# Patient Record
Sex: Male | Born: 1965 | Race: White | Hispanic: No | Marital: Married | Smoking: Never smoker
Health system: Southern US, Community
[De-identification: ages and names within clinical notes are randomized; demographics above are authoritative.]

## PROBLEM LIST (undated history)

## (undated) DIAGNOSIS — IMO0002 Reserved for concepts with insufficient information to code with codable children: Secondary | ICD-10-CM

## (undated) DIAGNOSIS — E785 Hyperlipidemia, unspecified: Secondary | ICD-10-CM

## (undated) DIAGNOSIS — F319 Bipolar disorder, unspecified: Secondary | ICD-10-CM

## (undated) DIAGNOSIS — F32A Depression, unspecified: Secondary | ICD-10-CM

## (undated) DIAGNOSIS — F329 Major depressive disorder, single episode, unspecified: Secondary | ICD-10-CM

## (undated) HISTORY — DX: Reserved for concepts with insufficient information to code with codable children: IMO0002

## (undated) HISTORY — DX: Bipolar disorder, unspecified: F31.9

## (undated) HISTORY — DX: Hyperlipidemia, unspecified: E78.5

## (undated) HISTORY — PX: OTHER SURGICAL HISTORY: SHX169

## (undated) HISTORY — DX: Depression, unspecified: F32.A

## (undated) HISTORY — PX: TONSILLECTOMY: SUR1361

## (undated) HISTORY — DX: Major depressive disorder, single episode, unspecified: F32.9

---

## 2009-01-19 ENCOUNTER — Ambulatory Visit (HOSPITAL_COMMUNITY): Payer: Self-pay | Admitting: Psychiatry

## 2009-02-19 ENCOUNTER — Ambulatory Visit (HOSPITAL_COMMUNITY): Payer: Self-pay | Admitting: Psychiatry

## 2009-04-06 ENCOUNTER — Ambulatory Visit (HOSPITAL_COMMUNITY): Payer: Self-pay | Admitting: Psychiatry

## 2009-04-19 ENCOUNTER — Ambulatory Visit (HOSPITAL_COMMUNITY): Payer: Self-pay | Admitting: Licensed Clinical Social Worker

## 2009-04-24 ENCOUNTER — Ambulatory Visit (HOSPITAL_COMMUNITY): Payer: Self-pay | Admitting: Licensed Clinical Social Worker

## 2009-05-01 ENCOUNTER — Ambulatory Visit (HOSPITAL_COMMUNITY): Payer: Self-pay | Admitting: Licensed Clinical Social Worker

## 2009-05-17 ENCOUNTER — Ambulatory Visit (HOSPITAL_COMMUNITY): Payer: Self-pay | Admitting: Licensed Clinical Social Worker

## 2009-05-23 ENCOUNTER — Ambulatory Visit (HOSPITAL_COMMUNITY): Payer: Self-pay | Admitting: Licensed Clinical Social Worker

## 2009-05-31 ENCOUNTER — Ambulatory Visit (HOSPITAL_COMMUNITY): Payer: Self-pay | Admitting: Licensed Clinical Social Worker

## 2009-06-01 ENCOUNTER — Ambulatory Visit (HOSPITAL_COMMUNITY): Payer: Self-pay | Admitting: Psychiatry

## 2009-06-25 ENCOUNTER — Ambulatory Visit (HOSPITAL_COMMUNITY): Payer: Self-pay | Admitting: Licensed Clinical Social Worker

## 2009-07-11 ENCOUNTER — Ambulatory Visit (HOSPITAL_COMMUNITY): Payer: Self-pay | Admitting: Licensed Clinical Social Worker

## 2009-07-16 ENCOUNTER — Ambulatory Visit (HOSPITAL_COMMUNITY): Payer: Self-pay | Admitting: Licensed Clinical Social Worker

## 2009-07-23 ENCOUNTER — Ambulatory Visit (HOSPITAL_COMMUNITY): Payer: Self-pay | Admitting: Licensed Clinical Social Worker

## 2009-08-20 ENCOUNTER — Ambulatory Visit (HOSPITAL_COMMUNITY): Payer: Self-pay | Admitting: Licensed Clinical Social Worker

## 2009-09-07 ENCOUNTER — Ambulatory Visit (HOSPITAL_COMMUNITY): Payer: Self-pay | Admitting: Psychiatry

## 2009-09-13 ENCOUNTER — Ambulatory Visit (HOSPITAL_COMMUNITY): Payer: Self-pay | Admitting: Licensed Clinical Social Worker

## 2009-12-07 ENCOUNTER — Ambulatory Visit (HOSPITAL_COMMUNITY): Payer: Self-pay | Admitting: Psychiatry

## 2010-03-08 ENCOUNTER — Ambulatory Visit (HOSPITAL_COMMUNITY): Payer: Self-pay | Admitting: Psychiatry

## 2010-03-31 HISTORY — PX: FOOT SURGERY: SHX648

## 2010-06-05 ENCOUNTER — Encounter (HOSPITAL_COMMUNITY): Payer: BC Managed Care – PPO | Admitting: Psychiatry

## 2010-06-05 DIAGNOSIS — F3189 Other bipolar disorder: Secondary | ICD-10-CM

## 2010-06-07 ENCOUNTER — Encounter (HOSPITAL_COMMUNITY): Payer: Self-pay | Admitting: Psychiatry

## 2010-09-06 ENCOUNTER — Encounter (HOSPITAL_COMMUNITY): Payer: BC Managed Care – PPO | Admitting: Psychiatry

## 2010-09-06 DIAGNOSIS — F3189 Other bipolar disorder: Secondary | ICD-10-CM

## 2010-09-30 ENCOUNTER — Encounter (HOSPITAL_COMMUNITY): Payer: BC Managed Care – PPO | Admitting: Psychiatry

## 2010-10-30 ENCOUNTER — Encounter (HOSPITAL_COMMUNITY): Payer: BC Managed Care – PPO | Admitting: Psychiatry

## 2010-10-30 DIAGNOSIS — F3189 Other bipolar disorder: Secondary | ICD-10-CM

## 2011-01-01 ENCOUNTER — Encounter (HOSPITAL_COMMUNITY): Payer: BC Managed Care – PPO | Admitting: Psychiatry

## 2011-04-10 ENCOUNTER — Other Ambulatory Visit (HOSPITAL_COMMUNITY): Payer: Self-pay | Admitting: Psychiatry

## 2011-04-10 NOTE — Telephone Encounter (Signed)
Dr. Lolly Mustache,  Timothy Burnett's last appointment was 10/30/10. He cancelled an appointment in October. He is scheduled to be seen 05/09/11. I was not sure how to refill this.  Sandi

## 2011-05-09 ENCOUNTER — Encounter (HOSPITAL_COMMUNITY): Payer: Self-pay | Admitting: Psychiatry

## 2011-05-09 ENCOUNTER — Ambulatory Visit (INDEPENDENT_AMBULATORY_CARE_PROVIDER_SITE_OTHER): Payer: BC Managed Care – PPO | Admitting: Psychiatry

## 2011-05-09 ENCOUNTER — Encounter (HOSPITAL_COMMUNITY): Payer: Self-pay

## 2011-05-09 DIAGNOSIS — Z79899 Other long term (current) drug therapy: Secondary | ICD-10-CM

## 2011-05-09 DIAGNOSIS — F319 Bipolar disorder, unspecified: Secondary | ICD-10-CM

## 2011-05-09 MED ORDER — SERTRALINE HCL 100 MG PO TABS: 100.0000 mg | ORAL_TABLET | Freq: Every day | ORAL | Status: DC

## 2011-05-09 MED ORDER — OLANZAPINE 7.5 MG PO TABS: 7.5000 mg | ORAL_TABLET | Freq: Every day | ORAL | Status: DC

## 2011-05-09 MED ORDER — LAMOTRIGINE 150 MG PO TABS: 150.0000 mg | ORAL_TABLET | Freq: Two times a day (BID) | ORAL | Status: DC

## 2011-05-09 NOTE — Progress Notes (Signed)
Chief complaint I need my medication  History of presenting illness Patient is 46 year old Caucasian married employed man who came for his followup appointment. Patient was last seen in August 2012. Patient has history of bipolar disorder. He is taking Lamictal Zyprexa and Zoloft. He has been compliant with his medication. He reported no side effects of medication. He has changed his job and now working for [employer]. Patient told though his income did not increase but it has better opportunity in the future. Patient continues to have marital issues especially related to finances. Her wife is not happy with her current job. Patient admitted episodic agitation anger and mood swings but they are less intense and less frequent. He is sleeping fine. He denies any tremors or side effects of medication. He has not seen his primary care Dr. but is scheduled to see in March. Patient is planning to go [facility] to visit his children in August this year.  Past psychiatric history Patient denies any history of suicidal attempt or any history of inpatient psychiatric treatment. However he endorsed a history of impulsive behavior. In the past he had tried Abilify Geodon with limited improvement.  Medical history Hyperlipidemia  Alcohol and substance use history Patient has a history of drinking alcohol or illegal substance use.  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  Family History Patient denies any family history of psychiatric illness  Psychosocial history Patient was [place]. Patient has 3 children from his second marriage. Patient children's lives in [place]. Patient has history of financial depth in 2001 that causes and off his marriage. Patient parents live in [place].  Mental status examination Patient is fairly groomed and well dressed. His speech is soft clear and coherent. He described his mood is  good and his affect is mood congruent. He denies any active or passive suicidal thoughts or homicidal thoughts. He denies any auditory or visual hallucination. His attention and concentration is fair. His thought process is logical. She's alert and oriented x3. His insight judgment and pulse control is okay.  Diagnoses Axis I bipolar disorder Axis II deferred Axis III see medical history Axis IV 1 to moderate Axis V 65-70  Plan At this time patient is fairly stable on his medication. I will continue his Lamictal Zoloft and Zyprexa. Patient reported no side effects including any tremors shakes or extrapyramidal side effects. I will order CBC CMP and hemoglobin A1c. I will see him again in 3 months. Time spent 30 minutes

## 2011-08-06 ENCOUNTER — Ambulatory Visit (HOSPITAL_COMMUNITY): Payer: Self-pay | Admitting: Psychiatry

## 2011-08-08 LAB — COMPREHENSIVE METABOLIC PANEL
ALT: 23 U/L (ref 0–53)
Albumin: 4.6 g/dL (ref 3.5–5.2)
CO2: 24 mEq/L (ref 19–32)
Calcium: 9.5 mg/dL (ref 8.4–10.5)
Chloride: 104 mEq/L (ref 96–112)
Glucose, Bld: 92 mg/dL (ref 70–99)
Potassium: 4.1 mEq/L (ref 3.5–5.3)
Sodium: 139 mEq/L (ref 135–145)
Total Bilirubin: 0.4 mg/dL (ref 0.3–1.2)
Total Protein: 6.6 g/dL (ref 6.0–8.3)

## 2011-08-09 LAB — CBC WITH DIFFERENTIAL/PLATELET
Basophils Absolute: 0 10*3/uL (ref 0.0–0.1)
Basophils Relative: 0 % (ref 0–1)
Eosinophils Absolute: 0.1 10*3/uL (ref 0.0–0.7)
Eosinophils Relative: 1 % (ref 0–5)
HCT: 43 % (ref 39.0–52.0)
Hemoglobin: 14.2 g/dL (ref 13.0–17.0)
Lymphocytes Relative: 27 % (ref 12–46)
Lymphs Abs: 2.4 10*3/uL (ref 0.7–4.0)
MCH: 31.1 pg (ref 26.0–34.0)
MCHC: 33 g/dL (ref 30.0–36.0)
MCV: 94.3 fL (ref 78.0–100.0)
Monocytes Absolute: 0.4 10*3/uL (ref 0.1–1.0)
Monocytes Relative: 5 % (ref 3–12)
Neutro Abs: 6 10*3/uL (ref 1.7–7.7)
Neutrophils Relative %: 67 % (ref 43–77)
Platelets: 228 10*3/uL (ref 150–400)
RBC: 4.56 MIL/uL (ref 4.22–5.81)
RDW: 14.1 % (ref 11.5–15.5)
WBC: 9 10*3/uL (ref 4.0–10.5)

## 2011-08-09 LAB — HEMOGLOBIN A1C
Hgb A1c MFr Bld: 5.5 % (ref <5.7)
Mean Plasma Glucose: 111 mg/dL (ref <117)

## 2011-08-13 ENCOUNTER — Encounter (HOSPITAL_COMMUNITY): Payer: Self-pay | Admitting: Psychiatry

## 2011-08-13 ENCOUNTER — Ambulatory Visit (INDEPENDENT_AMBULATORY_CARE_PROVIDER_SITE_OTHER): Payer: Self-pay | Admitting: Psychiatry

## 2011-08-13 VITALS — BP 134/70 | Wt 175.0 lb

## 2011-08-13 DIAGNOSIS — F319 Bipolar disorder, unspecified: Secondary | ICD-10-CM

## 2011-08-13 MED ORDER — LAMOTRIGINE 150 MG PO TABS: 150.0000 mg | ORAL_TABLET | Freq: Two times a day (BID) | ORAL | Status: DC

## 2011-08-13 MED ORDER — OLANZAPINE 7.5 MG PO TABS: 7.5000 mg | ORAL_TABLET | Freq: Every day | ORAL | Status: DC

## 2011-08-13 MED ORDER — SERTRALINE HCL 100 MG PO TABS: 100.0000 mg | ORAL_TABLET | Freq: Every day | ORAL | Status: DC

## 2011-08-13 NOTE — Progress Notes (Signed)
Chief complaint Medication management and followup.    History of presenting illness Patient is 46 year old Caucasian married employed man who came for his followup appointment.  Patient excited as he visiting his children next month.  He is planning to visit [place] to see them.  He is compliant with his medication .  He denies any side effects of medication except that he is feeling tired sometime.  He is taking medication for his high cholesterol and believe that is causing him excessive tiredness.  Overall his mood has been stable.  He denies any agitation anger mood swing.  He sleeps fine.  He likes his job.  He is not drinking or using any illegal substance.  No paranoia or psychosis at this time.  He denies any crying spells or any depressive thoughts.  He has done his blood work which is within normal limit.  Current psychiatric medication Lamictal 150 mg twice a day Olanzapine 7.5 mg at bedtime Zoloft 100 mg daily.  Past psychiatric history Patient denies any history of suicidal attempt or any history of inpatient psychiatric treatment. However he endorsed a history of impulsive behavior. In the past he had tried Abilify Geodon with limited improvement.  Medical history Hyperlipidemia  Alcohol and substance use history Patient denies any recent history of drinking alcohol or illegal substance use.  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  Family History Patient denies any family history of psychiatric illness  Psychosocial history Patient was born in [place]. Patient has 3 children from his second marriage. Patient children's lives in [place]. Patient has history of financial depth in 2001 that causes and off his marriage. Patient parents live in [place].  Mental status examination Patient is fairly groomed and well dressed.  He is pleasant calm and cooperative.  His speech is soft clear and  coherent. He described his mood is good and his affect is mood congruent. He denies any active or passive suicidal thoughts or homicidal thoughts. He denies any auditory or visual hallucination. His attention and concentration is fair. His thought process is logical.  There no psychotic symptoms present.  He is alert and oriented x3. His insight judgment and pulse control is okay.  Diagnoses Axis I bipolar disorder Axis II deferred Axis III see medical history Axis IV 1 to moderate Axis V 65-70  Plan I reviewed her psychosocial stressors, recent blood test results last previous notes.  He is a normal CBC and chemistry and his hemoglobin A1c is 5.5.  Patient is stable on his current psychiatric medication.  I recommend to see his primary care physician to discuss for his chronic tiredness which could be due to cost of medication.  I recommend to call us if he has any question or concern about the medication or if he feel worsening of the symptoms.  I will see him again in 3 months.  Time spent 30 minutes.

## 2011-11-12 ENCOUNTER — Other Ambulatory Visit (HOSPITAL_COMMUNITY): Payer: Self-pay | Admitting: *Deleted

## 2011-11-12 DIAGNOSIS — F319 Bipolar disorder, unspecified: Secondary | ICD-10-CM

## 2011-11-12 MED ORDER — LAMOTRIGINE 150 MG PO TABS: 150.0000 mg | ORAL_TABLET | Freq: Two times a day (BID) | ORAL | Status: DC

## 2011-11-12 MED ORDER — OLANZAPINE 7.5 MG PO TABS: 7.5000 mg | ORAL_TABLET | Freq: Every day | ORAL | Status: DC

## 2011-11-12 MED ORDER — SERTRALINE HCL 100 MG PO TABS: 100.0000 mg | ORAL_TABLET | Freq: Every day | ORAL | Status: DC

## 2011-11-14 ENCOUNTER — Ambulatory Visit (HOSPITAL_COMMUNITY): Payer: Self-pay | Admitting: Psychiatry

## 2011-11-26 ENCOUNTER — Ambulatory Visit (INDEPENDENT_AMBULATORY_CARE_PROVIDER_SITE_OTHER): Payer: BC Managed Care – PPO | Admitting: Psychiatry

## 2011-11-26 ENCOUNTER — Encounter (HOSPITAL_COMMUNITY): Payer: Self-pay | Admitting: Psychiatry

## 2011-11-26 VITALS — BP 117/80 | HR 88 | Wt 175.0 lb

## 2011-11-26 DIAGNOSIS — F319 Bipolar disorder, unspecified: Secondary | ICD-10-CM

## 2011-11-26 MED ORDER — LAMOTRIGINE 150 MG PO TABS: 150.0000 mg | ORAL_TABLET | Freq: Two times a day (BID) | ORAL | Status: DC

## 2011-11-26 MED ORDER — SERTRALINE HCL 100 MG PO TABS: 100.0000 mg | ORAL_TABLET | Freq: Every day | ORAL | Status: DC

## 2011-11-26 MED ORDER — OLANZAPINE 7.5 MG PO TABS: 7.5000 mg | ORAL_TABLET | Freq: Every day | ORAL | Status: DC

## 2011-11-26 NOTE — Progress Notes (Signed)
Chief complaint Medication management and followup.    History of presenting illness Patient is 46 year old Caucasian married employed man who came for his followup appointment.  Patient is compliant with his psychiatric medication.  Recently his job schedule is change in these very happy with her schedule.  He is working 4 days 12 hour shift in [place] and he still able to keep his job at [employer].  He still feel tired especially those days when he work at [employer].  He has not seen his primary care physician which was recommended on his last visit to rule out his fatigue and tiredness.  He feel better with a schedule job hours.  He visited [place] to see his daughter.  He reported history was very good and he enjoyed visiting them.  Overall patient is doing very well on his current psychiatric medication.  He denies any agitation anger mood swing.  He is sleeping better.  He denies any recent paranoia or any psychotic episode.  He denies any tremors or shakes.  He's not drinking or using any illegal substance.  Current psychiatric medication Lamictal 150 mg twice a day Olanzapine 7.5 mg at bedtime Zoloft 100 mg daily.  Past psychiatric history Patient denies any history of suicidal attempt or any history of inpatient psychiatric treatment. However he endorsed a history of impulsive behavior. In the past he had tried Abilify Geodon with limited improvement.  Medical history Hyperlipidemia.  Alcohol and substance use history Patient denies any recent history of drinking alcohol or illegal substance use.  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  Family History Patient denies any family history of psychiatric illness  Psychosocial history Patient was born in [place].  Patient has been married 3 times.  Patient has 3 children from his second marriage. Patient children's lives in [place]. Patient has history of  financial depth in 2001 that causes problem in his marriage. Patient lives with her current wife .    Mental status examination Patient is fairly groomed and well dressed.  He is pleasant calm and cooperative.  His speech is soft clear and coherent. He described his mood is good and his affect is mood congruent. He denies any active or passive suicidal thoughts or homicidal thoughts. He denies any auditory or visual hallucination. His attention and concentration is good His thought process is logical.  There no psychotic symptoms present.  He is alert and oriented x3. His insight judgment and pulse control is okay.  Diagnoses Axis I bipolar disorder Axis II deferred Axis III see medical history Axis IV 1 to moderate Axis V 65-70  Plan I reviewed his vitals, last progress note and response to medication.  One more time I encouraged him to have checkup with his primary care physician for chronic complain of tired feeling.  We have discusses blood results on his last visit which was normal.  I will continue his current psychiatric medication.  I will see him again in 4 months.  Time spent 30 minutes.

## 2012-03-29 ENCOUNTER — Ambulatory Visit (HOSPITAL_COMMUNITY): Payer: Self-pay | Admitting: Psychiatry

## 2012-04-06 ENCOUNTER — Encounter (HOSPITAL_COMMUNITY): Payer: Self-pay | Admitting: Psychiatry

## 2012-04-06 ENCOUNTER — Ambulatory Visit (INDEPENDENT_AMBULATORY_CARE_PROVIDER_SITE_OTHER): Payer: Self-pay | Admitting: Psychiatry

## 2012-04-06 DIAGNOSIS — F319 Bipolar disorder, unspecified: Secondary | ICD-10-CM

## 2012-04-06 MED ORDER — LAMOTRIGINE 150 MG PO TABS: 150.0000 mg | ORAL_TABLET | Freq: Every day | ORAL | Status: DC

## 2012-04-06 MED ORDER — SERTRALINE HCL 100 MG PO TABS: 100.0000 mg | ORAL_TABLET | Freq: Every day | ORAL | Status: DC

## 2012-04-06 MED ORDER — OLANZAPINE 7.5 MG PO TABS: 7.5000 mg | ORAL_TABLET | Freq: Every day | ORAL | Status: DC

## 2012-04-06 MED ORDER — LAMOTRIGINE 150 MG PO TABS: 150.0000 mg | ORAL_TABLET | Freq: Two times a day (BID) | ORAL | Status: DC

## 2012-04-06 NOTE — Progress Notes (Signed)
Patient ID: Timothy Burnett, male   DOB: 22-Aug-1965, 47 y.o.   MRN: [id number] Chief complaint Medication management and followup.    History of presenting illness Patient is 47 year old Caucasian married employed man who came for his followup appointment.  Patient is compliant with his psychiatric medication.  He was disappointed from his job because he was doing 12 hour shift 4 days but now he is doing rotation and some days work in the night and some days work in the morning.  He's not happy with his job schedule but he has no other choice.  He still works second job at [employer] .  He has noted some time getting frustrated but denies any aggression and violence or severe mood swing.  He does not want to change his medication.  He scheduled to see his primary care physician on second week of March at [facility] .  He's not taking medication for his cholesterol as he ran out and has not seen physician.  He denies any side effects of medication.  He's not drinking or using any illegal substance.  He denies any paranoia or any hallucination.  Current psychiatric medication Lamictal 150 mg twice a day Olanzapine 7.5 mg at bedtime Zoloft 100 mg daily.  Past psychiatric history Patient denies any history of suicidal attempt or any history of inpatient psychiatric treatment. However he endorsed a history of impulsive behavior. In the past he had tried Abilify Geodon with limited improvement.  Medical history Hyperlipidemia.  Alcohol and substance use history Patient denies any recent history of drinking alcohol or illegal substance use.  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  Family History Patient denies any family history of psychiatric illness  Psychosocial history Patient was [place].  Patient has been married 3 times.  Patient has 3 children from his second marriage. Patient children's lives in [place]. Patient has history of financial depth in 2001 that causes problem in his marriage. Patient lives with her current wife .    Mental status examination Patient is fairly groomed and well dressed.  He is pleasant calm and cooperative.  His speech is soft clear and coherent. He described his mood is okay and his affect is mood appropriate.  He denies any active or passive suicidal thoughts or homicidal thoughts. He denies any auditory or visual hallucination. His attention and concentration is good His thought process is logical.  There no psychotic symptoms present.  He is alert and oriented x3. His insight judgment and pulse control is okay.  Diagnoses Axis I bipolar disorder Axis II deferred Axis III see medical history Axis IV 1 to moderate Axis V 65-70  Plan I will continue his current psychiatric medication.  However I offer to call us if he feel that he is getting more irritable and frustrated.  He's looked into changes medication.  I also recommend individual counseling but he refused.  I explained risks and benefits of medication.  I will see him again in 3 months.  Patient is scheduled to see primary care physician and second week of March.

## 2012-04-07 ENCOUNTER — Ambulatory Visit (HOSPITAL_COMMUNITY): Payer: Self-pay | Admitting: Psychiatry

## 2012-04-30 ENCOUNTER — Ambulatory Visit: Payer: Self-pay | Admitting: Family Medicine

## 2012-06-11 ENCOUNTER — Encounter: Payer: Self-pay | Admitting: Family Medicine

## 2012-06-11 ENCOUNTER — Ambulatory Visit (INDEPENDENT_AMBULATORY_CARE_PROVIDER_SITE_OTHER): Payer: BC Managed Care – PPO | Admitting: Family Medicine

## 2012-06-11 VITALS — BP 120/70 | HR 76 | Temp 98.2°F | Ht 68.5 in | Wt 172.8 lb

## 2012-06-11 DIAGNOSIS — Z0000 Encounter for general adult medical examination without abnormal findings: Secondary | ICD-10-CM | POA: Insufficient documentation

## 2012-06-11 DIAGNOSIS — E785 Hyperlipidemia, unspecified: Secondary | ICD-10-CM

## 2012-06-11 LAB — PSA: PSA: 0.88 ng/mL (ref 0.10–4.00)

## 2012-06-11 LAB — BASIC METABOLIC PANEL
CO2: 29 mEq/L (ref 19–32)
Calcium: 9.4 mg/dL (ref 8.4–10.5)
Chloride: 101 mEq/L (ref 96–112)
Glucose, Bld: 86 mg/dL (ref 70–99)
Sodium: 139 mEq/L (ref 135–145)

## 2012-06-11 LAB — CBC WITH DIFFERENTIAL/PLATELET
Eosinophils Relative: 1.4 % (ref 0.0–5.0)
HCT: 42.8 % (ref 39.0–52.0)
Hemoglobin: 14.3 g/dL (ref 13.0–17.0)
Lymphs Abs: 2.3 10*3/uL (ref 0.7–4.0)
Monocytes Relative: 5.3 % (ref 3.0–12.0)
Platelets: 242 10*3/uL (ref 150.0–400.0)
WBC: 10.3 10*3/uL (ref 4.5–10.5)

## 2012-06-11 LAB — HEPATIC FUNCTION PANEL
ALT: 23 U/L (ref 0–53)
AST: 19 U/L (ref 0–37)
Albumin: 4.3 g/dL (ref 3.5–5.2)
Alkaline Phosphatase: 88 U/L (ref 39–117)
Total Protein: 6.8 g/dL (ref 6.0–8.3)

## 2012-06-11 LAB — LIPID PANEL: Triglycerides: 293 mg/dL — ABNORMAL HIGH (ref 0.0–149.0)

## 2012-06-11 NOTE — Patient Instructions (Signed)
Follow up in 6 months to recheck cholesterol We'll notify you of your lab results and make any changes if needed Call with any questions or concerns Welcome!  We're glad to have you!

## 2012-06-11 NOTE — Progress Notes (Signed)
  Subjective:    Patient ID: Timothy Burnett, male    DOB: [birth year], 47 y.o.   MRN: [id number]  HPI New to establish.  Previous MD- [facility].  Psych- [facility]  No concerns today.   Review of Systems Patient reports no vision/hearing changes, anorexia, fever ,adenopathy, persistant/recurrent hoarseness, swallowing issues, chest pain, palpitations, edema, persistant/recurrent cough, hemoptysis, dyspnea (rest,exertional, paroxysmal nocturnal), gastrointestinal  bleeding (melena, rectal bleeding), abdominal pain, excessive heart burn, GU symptoms (dysuria, hematuria, voiding/incontinence issues) syncope, focal weakness, memory loss, numbness & tingling, skin/hair/nail changes, depression, anxiety, abnormal bruising/bleeding, musculoskeletal symptoms/signs.     Objective:   Physical Exam BP 120/70  Pulse 76  Temp(Src) 98.2 F (36.8 C) (Oral)  Ht 5' 8.5" (1.74 m)  Wt 172 lb 12.8 oz (78.382 kg)  BMI 25.89 kg/m2  General Appearance:    Alert, cooperative, no distress, appears stated age  Head:    Normocephalic, without obvious abnormality, atraumatic  Eyes:    PERRL, conjunctiva/corneas clear, EOM's intact, fundi    benign, both eyes       Ears:    Normal TM's and external ear canals, both ears  Nose:   Nares normal, septum midline, mucosa normal, no drainage   or sinus tenderness  Throat:   Lips, mucosa, and tongue normal; teeth and gums normal  Neck:   Supple, symmetrical, trachea midline, no adenopathy;       thyroid:  No enlargement/tenderness/nodules  Back:     Symmetric, no curvature, ROM normal, no CVA tenderness  Lungs:     Clear to auscultation bilaterally, respirations unlabored  Chest wall:    No tenderness or deformity  Heart:    Regular rate and rhythm, S1 and S2 normal, no murmur, rub   or gallop  Abdomen:     Soft, non-tender, bowel sounds active all four quadrants,    no masses, no organomegaly  Genitalia:    Normal male without lesion, discharge or tenderness  Rectal:     Normal tone, normal prostate, no masses or tenderness  Extremities:   Extremities normal, atraumatic, no cyanosis or edema  Pulses:   2+ and symmetric all extremities  Skin:   Skin color, texture, turgor normal, no rashes or lesions  Lymph nodes:   Cervical, supraclavicular, and axillary nodes normal  Neurologic:   CNII-XII intact. Normal strength, sensation and reflexes      throughout          Assessment & Plan:

## 2012-06-11 NOTE — Assessment & Plan Note (Signed)
Pt's PE WNL.  Check labs.  EKG done- see document for interpretation.  Anticipatory guidance provided.

## 2012-06-11 NOTE — Assessment & Plan Note (Signed)
Check labs.  Restart meds prn.

## 2012-06-14 LAB — LDL CHOLESTEROL, DIRECT: Direct LDL: 155.6 mg/dL

## 2012-06-15 ENCOUNTER — Other Ambulatory Visit: Payer: Self-pay | Admitting: *Deleted

## 2012-06-15 DIAGNOSIS — E7800 Pure hypercholesterolemia, unspecified: Secondary | ICD-10-CM

## 2012-06-15 MED ORDER — ATORVASTATIN CALCIUM 40 MG PO TABS: 40.0000 mg | ORAL_TABLET | Freq: Every day | ORAL | Status: DC

## 2012-06-15 NOTE — Telephone Encounter (Signed)
Patient notified of lab results and MD recommendation to start Lipitor 40 mg qhs. Rx for lipitor sent to [facility] in [place].

## 2012-07-09 ENCOUNTER — Ambulatory Visit (HOSPITAL_COMMUNITY): Payer: Self-pay | Admitting: Psychiatry

## 2012-07-27 ENCOUNTER — Ambulatory Visit (HOSPITAL_COMMUNITY): Payer: Self-pay | Admitting: Psychiatry

## 2012-08-16 ENCOUNTER — Other Ambulatory Visit (HOSPITAL_COMMUNITY): Payer: Self-pay | Admitting: Psychiatry

## 2012-08-17 ENCOUNTER — Other Ambulatory Visit (HOSPITAL_COMMUNITY): Payer: Self-pay | Admitting: Psychiatry

## 2012-08-18 ENCOUNTER — Ambulatory Visit (INDEPENDENT_AMBULATORY_CARE_PROVIDER_SITE_OTHER): Payer: BC Managed Care – PPO | Admitting: Psychiatry

## 2012-08-18 ENCOUNTER — Encounter (HOSPITAL_COMMUNITY): Payer: Self-pay | Admitting: Psychiatry

## 2012-08-18 VITALS — BP 106/70 | HR 87 | Ht 69.0 in | Wt 174.6 lb

## 2012-08-18 DIAGNOSIS — F319 Bipolar disorder, unspecified: Secondary | ICD-10-CM

## 2012-08-18 MED ORDER — OLANZAPINE 10 MG PO TABS: 10.0000 mg | ORAL_TABLET | Freq: Every day | ORAL | Status: DC

## 2012-08-18 MED ORDER — SERTRALINE HCL 100 MG PO TABS: 100.0000 mg | ORAL_TABLET | Freq: Every day | ORAL | Status: DC

## 2012-08-18 MED ORDER — LAMOTRIGINE 150 MG PO TABS: 150.0000 mg | ORAL_TABLET | Freq: Two times a day (BID) | ORAL | Status: DC

## 2012-08-18 NOTE — Progress Notes (Signed)
[facility] [id number] Progress Note  Timothy Burnett [id number] 47 y.o.  08/18/2012 1:57 PM  Chief Complaint:  I'm having a lot of mood swing and anger.  History of Present Illness: Patient is a 47 year old Caucasian married man who came for his followup appointment.  He was last seen on January 7.  Patient is compliant with his medication however recently he has noticed increased irritability anger and mood swing.  He is very concerned about his job.  He is now working in morning shift.  He admitted some mistakes and trying to work on those issues.  He did not provide much detail but admitted that his behavior is a problem at work.  He sleeps on and off and gets very anxious in the night.  He admitted racing thoughts and irritability.  He denies any hallucination or any paranoia.  Is not drinking or using any illegal substance.  His family is doing very well.  Recently he went to [place] with his wife and had a good time.  In the high she has a blood work and he was seen by his primary care physician.  His cholesterol was high and he is taking Zocor for his cholesterol.  His other blood work was normal.  Suicidal Ideation: No Plan Formed: No Patient has means to carry out plan: No  Homicidal Ideation: No Plan Formed: No Patient has means to carry out plan: No  Review of Systems  Constitutional: Positive for malaise/fatigue.       Tired  HENT: Negative.   Eyes: Negative.   Respiratory: Negative.   Cardiovascular: Negative.   Musculoskeletal: Negative.   Skin: Negative.   Neurological: Negative.   Psychiatric/Behavioral: Positive for depression. Negative for suicidal ideas and substance abuse. The patient is nervous/anxious and has insomnia.     Psychiatric: Agitation: Yes Hallucination: No Depressed Mood: Yes Insomnia: Yes Hypersomnia: No Altered Concentration: No Feels Worthless: No Grandiose Ideas: No Belief In Special Powers: No New/Increased Substance Abuse:  No Compulsions: No  Neurologic: Headache: No Seizure: No Paresthesias: No  Medical History: Patient is a hyperlipidemia and he sees Dr. Beverely Low.   Alcohol and substance use history. Patient denies any history of alcohol or any illegal substance use.  Psychosocial history. Patient is born and raised in [place].  His been married 3 times.  He has 3 children from his second marriage.  His children lives in [place].  Patient has history of financial debt in 2001 that cause problems in his marriage.  Education and work history. 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.  Outpatient Encounter Prescriptions as of 08/18/2012  Medication Sig Dispense Refill  . atorvastatin (LIPITOR) 40 MG tablet Take 1 tablet (40 mg total) by mouth at bedtime.  30 tablet  5  . lamoTRIgine (LAMICTAL) 150 MG tablet Take 1 tablet (150 mg total) by mouth 2 (two) times daily.  180 tablet  0  . OLANZapine (ZYPREXA) 10 MG tablet Take 1 tablet (10 mg total) by mouth at bedtime.  90 tablet  0  . sertraline (ZOLOFT) 100 MG tablet Take 1 tablet (100 mg total) by mouth daily.  90 tablet  0  . [DISCONTINUED] lamoTRIgine (LAMICTAL) 150 MG tablet Take 1 tablet (150 mg total) by mouth 2 (two) times daily.  180 tablet  0  . [DISCONTINUED] OLANZapine (ZYPREXA) 7.5 MG tablet Take 1 tablet (7.5 mg total) by mouth at bedtime.  90 tablet  0  . [DISCONTINUED]  sertraline (ZOLOFT) 100 MG tablet Take 1 tablet (100 mg total) by mouth daily.  90 tablet  0   No facility-administered encounter medications on file as of 08/18/2012.    Past Psychiatric History/Hospitalization(s): Patient had tried Abilify and Geodon in the past with limited improvement.  He has a history of impulsive behavior. Anxiety: Yes Bipolar Disorder: Yes Depression: Yes Mania: Yes Psychosis: No Schizophrenia: No Personality Disorder: No Hospitalization for psychiatric illness: No History of Electroconvulsive  Shock Therapy: No Prior Suicide Attempts: No  Physical Exam: Constitutional:  BP 106/70  Pulse 87  Ht 5\' 9"  (1.753 m)  Wt 174 lb 9.6 oz (79.198 kg)  BMI 25.77 kg/m2  General Appearance: alert, oriented, no acute distress and well nourished  Musculoskeletal: Strength & Muscle Tone: within normal limits Gait & Station: normal Patient leans: N/A  Psychiatric: Speech (describe rate, volume, coherence, spontaneity, and abnormalities if any): Clear and coherent with normal tone and volume.  Thought Process (describe rate, content, abstract reasoning, and computation): Organized  Associations: Coherent and Intact  Thoughts: normal  Mental Status: Orientation: oriented to person, place, time/date, situation and day of week Mood & Affect: depressed affect, anxiety and flat affect Attention Span & Concentration: Fair  Medical Decision Making (Choose Three): Review of Psycho-Social Stressors (1), Review or order clinical lab tests (1), Established Problem, Worsening (2), Review of Last Therapy Session (1), Review of Medication Regimen & Side Effects (2) and Review of New Medication or Change in Dosage (2)  Assessment: Axis I: Bipolar disorder  Axis II: Deferred  Axis III:  Patient Active Problem List   Diagnosis Date Noted  . Other and unspecified hyperlipidemia 06/11/2012    Axis IV: Mild to moderate  Axis V: 65-70   Plan: I review his current psychiatric medication, recent blood work and psychosocial stressors.  In the past we have encouraged him to increase the medication but he refused however today he is willing to try higher dose of Zyprexa.  I would increase Zyprexa 10 mg to help his mood lability anger and insomnia.  We will continue Lamictal and Zoloft at present dose.  Patient is also taking Zocor for his hyperlipidemia.  Recommend to call us if he is any question of conservatively worsening of the symptom.  Discuss safety plan that anytime having active suicidal  thoughts or homicidal thoughts and he to call 911 or go to local emergency room.  I will see him again in 3 months.  Time spent 25 minutes.  More than 50% of the time spent in psychoeducation counseling and portion of care.    Morganna Styles T., MD 08/18/2012

## 2012-11-18 ENCOUNTER — Ambulatory Visit (HOSPITAL_COMMUNITY): Payer: Self-pay | Admitting: Psychiatry

## 2012-11-19 ENCOUNTER — Encounter (HOSPITAL_COMMUNITY): Payer: Self-pay | Admitting: Psychiatry

## 2012-11-19 ENCOUNTER — Ambulatory Visit (INDEPENDENT_AMBULATORY_CARE_PROVIDER_SITE_OTHER): Payer: BC Managed Care – PPO | Admitting: Psychiatry

## 2012-11-19 VITALS — BP 121/83 | HR 93 | Wt 174.0 lb

## 2012-11-19 DIAGNOSIS — F319 Bipolar disorder, unspecified: Secondary | ICD-10-CM

## 2012-11-19 MED ORDER — LAMOTRIGINE 150 MG PO TABS: 150.0000 mg | ORAL_TABLET | Freq: Two times a day (BID) | ORAL | Status: DC

## 2012-11-19 MED ORDER — SERTRALINE HCL 100 MG PO TABS: 100.0000 mg | ORAL_TABLET | Freq: Every day | ORAL | Status: DC

## 2012-11-19 MED ORDER — OLANZAPINE 15 MG PO TABS: 15.0000 mg | ORAL_TABLET | Freq: Every day | ORAL | Status: DC

## 2012-11-19 NOTE — Progress Notes (Signed)
[facility] [address] Progress Note  Timothy Burnett [id number] 47 y.o.  11/19/2012 11:11 AM  Chief Complaint:  My wife lost job and I am under a lot of stress.   History of Present Illness: Patient is a 47 year old Caucasian married man who came for his followup appointment.  Patient c/o increase stress and irritability. He is working more hours including week ends. Patient told there is investigation going on because his wife did something but he did not provide any details. He endorse lack of sleep, headache and mood swings. On his last visit we increased zyprexa which he felt better initially. Patient is compliant with his medication and denies any side effects.  Patient now concern about his own job. He admitted poor sleep, headache, racing thoughts and agitation but denies any aggression or violence. He is not sure why he is having HA. He denies any hallucination or any paranoia.  He Is not drinking or using any illegal substance.  He is schedule to see his primary care for annual check up and labs in September.     Suicidal Ideation: No Plan Formed: No Patient has means to carry out plan: No  Homicidal Ideation: No Plan Formed: No Patient has means to carry out plan: No  Review of Systems  Constitutional: Positive for malaise/fatigue.       Tired  Eyes: Negative.   Respiratory: Negative.   Cardiovascular: Negative.   Musculoskeletal: Negative.   Skin: Negative.   Neurological: Positive for headaches.  Psychiatric/Behavioral: Positive for depression. Negative for suicidal ideas and substance abuse. The patient is nervous/anxious and has insomnia.     Psychiatric: Agitation: Yes Hallucination: No Depressed Mood: Yes Insomnia: Yes Hypersomnia: No Altered Concentration: No Feels Worthless: No Grandiose Ideas: No Belief In Special Powers: No New/Increased Substance Abuse: No Compulsions: No  Neurologic: Headache: No Seizure: No Paresthesias: No  Medical History:  Patient is a hyperlipidemia and he sees Dr. Beverely Low.   Alcohol and substance use history. Patient denies any history of alcohol or any illegal substance use.  Psychosocial history. Patient is born and raised in [place].  His been married 3 times.  He has 3 children from his second marriage.  His children lives in [place].  Patient has history of financial debt in 2001 that cause problems in his marriage.  Education and work history. 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.  Outpatient Encounter Prescriptions as of 11/19/2012  Medication Sig Dispense Refill  . atorvastatin (LIPITOR) 40 MG tablet Take 1 tablet (40 mg total) by mouth at bedtime.  30 tablet  5  . lamoTRIgine (LAMICTAL) 150 MG tablet Take 1 tablet (150 mg total) by mouth 2 (two) times daily.  180 tablet  0  . OLANZapine (ZYPREXA) 15 MG tablet Take 1 tablet (15 mg total) by mouth at bedtime.  90 tablet  0  . sertraline (ZOLOFT) 100 MG tablet Take 1 tablet (100 mg total) by mouth daily.  90 tablet  0  . [DISCONTINUED] lamoTRIgine (LAMICTAL) 150 MG tablet Take 1 tablet (150 mg total) by mouth 2 (two) times daily.  180 tablet  0  . [DISCONTINUED] OLANZapine (ZYPREXA) 10 MG tablet Take 1 tablet (10 mg total) by mouth at bedtime.  90 tablet  0  . [DISCONTINUED] sertraline (ZOLOFT) 100 MG tablet Take 1 tablet (100 mg total) by mouth daily.  90 tablet  0   No facility-administered encounter medications on file as of 11/19/2012.  Past Psychiatric History/Hospitalization(s): Patient had tried Abilify and Geodon in the past with limited improvement.  He has a history of impulsive behavior. Anxiety: Yes Bipolar Disorder: Yes Depression: Yes Mania: Yes Psychosis: No Schizophrenia: No Personality Disorder: No Hospitalization for psychiatric illness: No History of Electroconvulsive Shock Therapy: No Prior Suicide Attempts: No  Physical Exam: Constitutional:  BP 121/83  Pulse 93   Wt 174 lb (78.926 kg)  BMI 25.68 kg/m2  General Appearance: alert, oriented, no acute distress and well nourished  Musculoskeletal: Strength & Muscle Tone: within normal limits Gait & Station: normal Patient leans: N/A  Psychiatric: Speech (describe rate, volume, coherence, spontaneity, and abnormalities if any): Clear and coherent with normal tone and volume.  Thought Process (describe rate, content, abstract reasoning, and computation): Organized  Associations: Coherent and Intact  Thoughts: normal  Mental Status: Orientation: oriented to person, place, time/date, situation and day of week Mood & Affect: depressed affect, anxiety and flat affect Attention Span & Concentration: Fair  Medical Decision Making (Choose Three): Review of Psycho-Social Stressors (1), Review or order clinical lab tests (1), Established Problem, Worsening (2), Review of Last Therapy Session (1), Review of Medication Regimen & Side Effects (2) and Review of New Medication or Change in Dosage (2)  Assessment: Axis I: Bipolar disorder  Axis II: Deferred  Axis III:  Patient Active Problem List   Diagnosis Date Noted  . Other and unspecified hyperlipidemia 06/11/2012    Axis IV: Mild to moderate  Axis V: 65-70   Plan: I review his psychosocial stressors, current medication and response to medications. I will increase his Zyprexa 15 mg to help his mood lability anger and insomnia.  His vitals are stable. We will continue Lamictal and Zoloft at present dose.  Patient is also taking Zocor for his hyperlipidemia.  Recommend to call us if he is any question or concern or if he worsening of symptoms. Rec to call PCP if HA does not resolve soon. Discuss safety plan that anytime having active suicidal thoughts or homicidal thoughts than he need to call 911 or go to local emergency room. Patient does not want counseling and like to come back in 3 month for follow up. Time spent 25 minutes.  More than 50% of  the time spent in psychoeducation counseling and coordination of care.    Broc Caspers T., MD 11/19/2012

## 2012-12-16 ENCOUNTER — Ambulatory Visit: Payer: BC Managed Care – PPO | Admitting: Family Medicine

## 2012-12-31 ENCOUNTER — Ambulatory Visit (INDEPENDENT_AMBULATORY_CARE_PROVIDER_SITE_OTHER): Payer: BC Managed Care – PPO | Admitting: Family Medicine

## 2012-12-31 ENCOUNTER — Encounter: Payer: Self-pay | Admitting: Family Medicine

## 2012-12-31 VITALS — BP 126/80 | HR 70 | Temp 98.3°F | Resp 16 | Wt 177.1 lb

## 2012-12-31 DIAGNOSIS — E785 Hyperlipidemia, unspecified: Secondary | ICD-10-CM

## 2012-12-31 LAB — HEPATIC FUNCTION PANEL
ALT: 50 U/L (ref 0–53)
Albumin: 4.2 g/dL (ref 3.5–5.2)
Total Bilirubin: 0.6 mg/dL (ref 0.3–1.2)
Total Protein: 7.1 g/dL (ref 6.0–8.3)

## 2012-12-31 LAB — BASIC METABOLIC PANEL
BUN: 15 mg/dL (ref 6–23)
Chloride: 111 mEq/L (ref 96–112)
Creatinine, Ser: 1.1 mg/dL (ref 0.4–1.5)

## 2012-12-31 LAB — LIPID PANEL
HDL: 40.1 mg/dL (ref >39.00)
Total CHOL/HDL Ratio: 5
Triglycerides: 186 mg/dL — ABNORMAL HIGH (ref 0.0–149.0)
VLDL: 37.2 mg/dL (ref 0.0–40.0)

## 2012-12-31 NOTE — Assessment & Plan Note (Signed)
Chronic problem.  Tolerating statin w/out difficulty.  Check labs.  Adjust meds prn  

## 2012-12-31 NOTE — Patient Instructions (Addendum)
Schedule your complete physical in 6 months We'll notify you of your lab results and make any changes if needed Keep up the good work!  You look great!! Enjoy your vacation and happy belated birthday!!!

## 2012-12-31 NOTE — Progress Notes (Signed)
  Subjective:    Patient ID: Timothy Burnett, male    DOB: April 16, 1965, 47 y.o.   MRN: [id number]  HPI hyperlipidemia- chronic problem, no abd pain.  No N/V, myalgias.  No CP, SOB, HAs, visual changes, edema.   Review of Systems For ROS see HPI     Objective:   Physical Exam  Vitals reviewed. Constitutional: He is oriented to person, place, and time. He appears well-developed and well-nourished. No distress.  HENT:  Head: Normocephalic and atraumatic.  Eyes: Conjunctivae and EOM are normal. Pupils are equal, round, and reactive to light.  Neck: Normal range of motion. Neck supple. No thyromegaly present.  Cardiovascular: Normal rate, regular rhythm, normal heart sounds and intact distal pulses.   No murmur heard. Pulmonary/Chest: Effort normal and breath sounds normal. No respiratory distress.  Abdominal: Soft. Bowel sounds are normal. He exhibits no distension.  Musculoskeletal: He exhibits no edema.  Lymphadenopathy:    He has no cervical adenopathy.  Neurological: He is alert and oriented to person, place, and time. No cranial nerve deficit.  Skin: Skin is warm and dry.  Psychiatric: He has a normal mood and affect. His behavior is normal.          Assessment & Plan:

## 2013-01-10 ENCOUNTER — Other Ambulatory Visit: Payer: Self-pay | Admitting: Family Medicine

## 2013-01-11 NOTE — Telephone Encounter (Signed)
Med filled.  

## 2013-02-03 ENCOUNTER — Other Ambulatory Visit: Payer: Self-pay

## 2013-02-20 ENCOUNTER — Other Ambulatory Visit (HOSPITAL_COMMUNITY): Payer: Self-pay | Admitting: Psychiatry

## 2013-02-21 ENCOUNTER — Other Ambulatory Visit (HOSPITAL_COMMUNITY): Payer: Self-pay | Admitting: *Deleted

## 2013-02-22 ENCOUNTER — Ambulatory Visit (HOSPITAL_COMMUNITY): Payer: Self-pay | Admitting: Psychiatry

## 2013-03-01 ENCOUNTER — Ambulatory Visit (INDEPENDENT_AMBULATORY_CARE_PROVIDER_SITE_OTHER): Payer: BC Managed Care – PPO | Admitting: Psychiatry

## 2013-03-01 ENCOUNTER — Encounter (HOSPITAL_COMMUNITY): Payer: Self-pay | Admitting: Psychiatry

## 2013-03-01 VITALS — BP 125/76 | HR 69 | Ht 69.0 in | Wt 180.0 lb

## 2013-03-01 DIAGNOSIS — F319 Bipolar disorder, unspecified: Secondary | ICD-10-CM

## 2013-03-01 NOTE — Progress Notes (Signed)
[facility] [address] Progress Note  Timothy Burnett [id number] 47 y.o.  03/01/2013 4:36 PM  Chief Complaint:  Medication management and followup .   History of Present Illness: Timothy Burnett came for his followup appointment.  On his last visit we increased his Zyprexa.  He is doing better but increase Zyprexa to be sleeping better.  He has an irritability anger or any depressive thoughts.  He is concerned about his wife who has not find a job yet.  Patient denies any tremors, shakes, irritability or any suicidal thoughts.  He was to continue his current psychotropic medication.  His headache is also much better.  He is not drinking or using any illicit substances.  Suicidal Ideation: No Plan Formed: No Patient has means to carry out plan: No  Homicidal Ideation: No Plan Formed: No Patient has means to carry out plan: No  Review of Systems  Psychiatric/Behavioral: Negative for suicidal ideas and substance abuse.    Psychiatric: Agitation: Yes Hallucination: No Depressed Mood: Yes Insomnia: Yes Hypersomnia: No Altered Concentration: No Feels Worthless: No Grandiose Ideas: No Belief In Special Powers: No New/Increased Substance Abuse: No Compulsions: No  Neurologic: Headache: No Seizure: No Paresthesias: No  Medical History: Patient is a hyperlipidemia and he sees Dr. Beverely Burnett.   Alcohol and substance use history. Patient denies any history of alcohol or any illegal substance use.  Psychosocial history. Patient is born and raised in [place].  His been married 3 times.  He has 3 children from his second marriage.  His children lives in [place].  Patient has history of financial debt in 2001 that cause problems in his marriage.  Education and work history. 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.  Outpatient Encounter Prescriptions as of 03/01/2013  Medication Sig  . atorvastatin (LIPITOR) 40 MG tablet TAKE ONE TABLET  BY MOUTH AT BEDTIME  . lamoTRIgine (LAMICTAL) 150 MG tablet TAKE ONE TABLET BY MOUTH TWICE DAILY  . OLANZapine (ZYPREXA) 15 MG tablet TAKE ONE TABLET BY MOUTH AT BEDTIME  . sertraline (ZOLOFT) 100 MG tablet TAKE ONE TABLET BY MOUTH ONCE DAILY    Past Psychiatric History/Hospitalization(s): Patient had tried Abilify and Geodon in the past with limited improvement.  He has a history of impulsive behavior. Anxiety: Yes Bipolar Disorder: Yes Depression: Yes Mania: Yes Psychosis: No Schizophrenia: No Personality Disorder: No Hospitalization for psychiatric illness: No History of Electroconvulsive Shock Therapy: No Prior Suicide Attempts: No  Physical Exam: Constitutional:  BP 125/76  Pulse 69  Ht 5\' 9"  (1.753 m)  Wt 180 lb (81.647 kg)  BMI 26.57 kg/m2  General Appearance: alert, oriented, no acute distress and well nourished  Musculoskeletal: Strength & Muscle Tone: within normal limits Gait & Station: normal Patient leans: N/A  Psychiatric: Speech (describe rate, volume, coherence, spontaneity, and abnormalities if any): Clear and coherent with normal tone and volume.  Thought Process (describe rate, content, abstract reasoning, and computation): Organized  Associations: Coherent and Intact  Thoughts: normal  Mental Status: Orientation: oriented to person, place, time/date, situation and day of week Mood & Affect: normal affect and anxiety Attention Span & Concentration: Fair  Medical Decision Making (Choose Three): Established Problem, Stable/Improving (1), Review of Psycho-Social Stressors (1), Review of Last Therapy Session (1) and Review of Medication Regimen & Side Effects (2)  Assessment: Axis I: Bipolar disorder  Axis II: Deferred  Axis III:  Patient Active Problem List   Diagnosis Date Noted  . Other and  unspecified hyperlipidemia 06/11/2012    Axis IV: Mild to moderate  Axis V: 65-70   Plan:  Patient is doing better on his current psychotropic  medication.  I will continue Lamictal 50 mg twice daily, Zoloft 100 mg daily and Zyprexa 15 mg at bedtime.  Recommend to call us back if he is any question or any concern.  Followup in 2 months.    Timothy Burnett T., MD 03/01/2013

## 2013-05-04 ENCOUNTER — Ambulatory Visit (HOSPITAL_COMMUNITY): Payer: Self-pay | Admitting: Psychiatry

## 2013-05-05 ENCOUNTER — Encounter (HOSPITAL_COMMUNITY): Payer: Self-pay | Admitting: Psychiatry

## 2013-05-05 ENCOUNTER — Ambulatory Visit (INDEPENDENT_AMBULATORY_CARE_PROVIDER_SITE_OTHER): Payer: BC Managed Care – PPO | Admitting: Psychiatry

## 2013-05-05 VITALS — BP 136/76 | HR 76 | Ht 69.0 in | Wt 181.6 lb

## 2013-05-05 DIAGNOSIS — F319 Bipolar disorder, unspecified: Secondary | ICD-10-CM

## 2013-05-05 MED ORDER — SERTRALINE HCL 100 MG PO TABS: ORAL_TABLET | ORAL | Status: DC

## 2013-05-05 MED ORDER — LAMOTRIGINE 150 MG PO TABS: ORAL_TABLET | ORAL | Status: DC

## 2013-05-05 MED ORDER — OLANZAPINE 20 MG PO TABS: ORAL_TABLET | ORAL | Status: DC

## 2013-05-05 NOTE — Progress Notes (Signed)
[place] [phone] Progress Note  Loi Rennaker [id number] 48 y.o.  05/05/2013 3:56 PM  Chief Complaint:  Medication management and followup .   History of Present Illness: Timothy Burnett came for his followup appointment.  He is compliant with his medication but continued to have irritability and anger.  His job is stressful.  He has recently fired for [employer] .  Patient has a lot of job responsibilities.  He is also concerned about his life.  She has not able to find a job and recently having health issues.  Patient told breast cancer runs in her family and she recently had a cyst in her breast.  He admitted to poor sleep, irritability and anger but denies any suicidal thoughts or homicidal thoughts.  He denies any hallucination or any paranoia.  He has no rash .  He has no tremors or shakes.  He does not drink or use any illegal substances.  His appetite is unchanged from the past.  Suicidal Ideation: No Plan Formed: No Patient has means to carry out plan: No  Homicidal Ideation: No Plan Formed: No Patient has means to carry out plan: No  ROS  Psychiatric: Agitation: Yes Hallucination: No Depressed Mood: Yes Insomnia: Yes Hypersomnia: No Altered Concentration: No Feels Worthless: No Grandiose Ideas: No Belief In Special Powers: No New/Increased Substance Abuse: No Compulsions: No  Neurologic: Headache: No Seizure: No Paresthesias: No  Medical History:  Patient has hyperlipidemia.  He sees Dr. Birdie Riddle.  Outpatient Encounter Prescriptions as of 05/05/2013  Medication Sig  . atorvastatin (LIPITOR) 40 MG tablet TAKE ONE TABLET BY MOUTH AT BEDTIME  . lamoTRIgine (LAMICTAL) 150 MG tablet TAKE ONE TABLET BY MOUTH TWICE DAILY  . OLANZapine (ZYPREXA) 20 MG tablet TAKE ONE TABLET BY MOUTH AT BEDTIME  . sertraline (ZOLOFT) 100 MG tablet TAKE ONE TABLET BY MOUTH ONCE DAILY  . [DISCONTINUED] lamoTRIgine (LAMICTAL) 150 MG tablet TAKE ONE TABLET BY MOUTH TWICE DAILY  . [DISCONTINUED]  OLANZapine (ZYPREXA) 15 MG tablet TAKE ONE TABLET BY MOUTH AT BEDTIME  . [DISCONTINUED] sertraline (ZOLOFT) 100 MG tablet TAKE ONE TABLET BY MOUTH ONCE DAILY    Past Psychiatric History/Hospitalization(s): Patient had tried Abilify and Geodon in the past with limited improvement.  He has a history of impulsive behavior. Anxiety: Yes Bipolar Disorder: Yes Depression: Yes Mania: Yes Psychosis: No Schizophrenia: No Personality Disorder: No Hospitalization for psychiatric illness: No History of Electroconvulsive Shock Therapy: No Prior Suicide Attempts: No  Physical Exam: Constitutional:  BP 136/76  Pulse 76  Ht 5\' 9"  (1.753 m)  Wt 181 lb 9.6 oz (82.373 kg)  BMI 26.81 kg/m2  General Appearance: well nourished, casually dressed and fairly groomed.  Musculoskeletal: Strength & Muscle Tone: within normal limits Gait & Station: normal Patient leans: N/A  Psychiatric: Speech (describe rate, volume, coherence, spontaneity, and abnormalities if any): Clear and coherent with normal tone and volume.  Thought Process (describe rate, content, abstract reasoning, and computation): Organized  Associations: Coherent and Intact,  Fund of knowledge adequate  Thoughts: normal  Mental Status: Orientation: oriented to person, place, time/date, situation and day of week Mood & Affect: normal affect and anxiety Attention Span & Concentration: Fair  Medical Decision Making (Choose Three): Established Problem, Stable/Improving (1), Review of Psycho-Social Stressors (1), Established Problem, Worsening (2), Review of Last Therapy Session (1), Review of Medication Regimen & Side Effects (2) and Review of New Medication or Change in Dosage (2)  Assessment: Axis I: Bipolar disorder  Axis II: Deferred  Axis III:  Patient Active Problem List   Diagnosis Date Noted  . Other and unspecified hyperlipidemia 06/11/2012    Axis IV: Mild to moderate  Axis V: 65-70   Plan:  I will increase his  Zyprexa 20 mg to help irritability, anger, insomnia and depression.  Patient is tolerating his medication very well .  I will continue Lamictal 150 mg twice a day and Zoloft 100 mg daily.  Patient is not interested in counseling.Recommend to call us back if has any question or any concern.  Followup in 3 months.    Kanan Sobek T., MD 05/05/2013

## 2013-05-25 ENCOUNTER — Other Ambulatory Visit: Payer: Self-pay | Admitting: Family Medicine

## 2013-05-25 NOTE — Telephone Encounter (Signed)
Med filled.  

## 2013-07-01 ENCOUNTER — Encounter: Payer: Self-pay | Admitting: Family Medicine

## 2013-08-03 ENCOUNTER — Encounter (HOSPITAL_COMMUNITY): Payer: Self-pay | Admitting: Psychiatry

## 2013-08-03 ENCOUNTER — Ambulatory Visit (INDEPENDENT_AMBULATORY_CARE_PROVIDER_SITE_OTHER): Payer: BC Managed Care – PPO | Admitting: Psychiatry

## 2013-08-03 VITALS — BP 118/71 | HR 103 | Ht 69.5 in | Wt 186.0 lb

## 2013-08-03 DIAGNOSIS — F319 Bipolar disorder, unspecified: Secondary | ICD-10-CM

## 2013-08-03 MED ORDER — LAMOTRIGINE 150 MG PO TABS: ORAL_TABLET | ORAL | Status: DC

## 2013-08-03 MED ORDER — SERTRALINE HCL 100 MG PO TABS: ORAL_TABLET | ORAL | Status: DC

## 2013-08-03 MED ORDER — OLANZAPINE 20 MG PO TABS: ORAL_TABLET | ORAL | Status: DC

## 2013-08-03 NOTE — Progress Notes (Signed)
[place] Progress Note  Timothy Burnett [id number] 48 y.o.  08/03/2013 11:19 AM  Chief Complaint:  Medication management and followup .   History of Present Illness: Timothy Burnett came for his followup appointment.  He is handling his job without any major issues in recent months.  He is happy his wife is able to get a part-time job.  On his last visit we have increased his Zyprexa to 20 mg.  He is tolerating his medication.  He is compliant with his medication and denies any side effects.  He is sleeping good.  He denies any irritability, anger or any mood swings in recent weeks.  He has no tremors or shakes.  He is scheduled to see his primary care physician very soon and I recommended to have his blood work sent to [place].  He denies any paranoia, hallucination or any depressive thoughts.  He was to continue his current psychotropic medication.  His appetite is okay.  His vitals are stable.  Suicidal Ideation: No Plan Formed: No Patient has means to carry out plan: No  Homicidal Ideation: No Plan Formed: No Patient has means to carry out plan: No  ROS  Psychiatric: Agitation: No Hallucination: No Depressed Mood: No Insomnia: No Hypersomnia: No Altered Concentration: No Feels Worthless: No Grandiose Ideas: No Belief In Special Powers: No New/Increased Substance Abuse: No Compulsions: No  Neurologic: Headache: No Seizure: No Paresthesias: No  Medical History:  Patient has hyperlipidemia.  He sees Dr. Birdie Riddle.  Outpatient Encounter Prescriptions as of 08/03/2013  Medication Sig  . lamoTRIgine (LAMICTAL) 150 MG tablet TAKE ONE TABLET BY MOUTH TWICE DAILY  . OLANZapine (ZYPREXA) 20 MG tablet TAKE ONE TABLET BY MOUTH AT BEDTIME  . sertraline (ZOLOFT) 100 MG tablet TAKE ONE TABLET BY MOUTH ONCE DAILY  . [DISCONTINUED] lamoTRIgine (LAMICTAL) 150 MG tablet TAKE ONE TABLET BY MOUTH TWICE DAILY  . [DISCONTINUED] OLANZapine (ZYPREXA) 20 MG tablet TAKE ONE TABLET BY MOUTH AT  BEDTIME  . [DISCONTINUED] sertraline (ZOLOFT) 100 MG tablet TAKE ONE TABLET BY MOUTH ONCE DAILY  . atorvastatin (LIPITOR) 40 MG tablet TAKE ONE TABLET BY MOUTH AT BEDTIME    Past Psychiatric History/Hospitalization(s): Patient had tried Abilify and Geodon in the past with limited improvement.  He has a history of impulsive behavior. Anxiety: Yes Bipolar Disorder: Yes Depression: Yes Mania: Yes Psychosis: No Schizophrenia: No Personality Disorder: No Hospitalization for psychiatric illness: No History of Electroconvulsive Shock Therapy: No Prior Suicide Attempts: No  Physical Exam: Constitutional:  BP 118/71  Pulse 103  Ht 5' 9.5" (1.765 m)  Wt 186 lb (84.369 kg)  BMI 27.08 kg/m2  General Appearance: well nourished, casually dressed and fairly groomed.  Musculoskeletal: Strength & Muscle Tone: within normal limits Gait & Station: normal Patient leans: N/A  Psychiatric: Speech (describe rate, volume, coherence, spontaneity, and abnormalities if any): Clear and coherent with normal tone and volume.  Thought Process (describe rate, content, abstract reasoning, and computation): Organized  Associations: Coherent and Intact,  Fund of knowledge adequate  Thoughts: normal  Mental Status: Orientation: oriented to person, place, time/date, situation and day of week Mood & Affect: normal affect and anxiety Attention Span & Concentration: Fair  Established Problem, Stable/Improving (1), Review of Last Therapy Session (1) and Review of Medication Regimen & Side Effects (2)  Assessment: Axis I: Bipolar disorder  Axis II: Deferred  Axis III:  Patient Active Problem List   Diagnosis Date Noted  . Other and unspecified hyperlipidemia 06/11/2012  Axis IV: Mild to moderate  Axis V: 65-70   Plan:  Patient is doing better on his current psychotropic medication.  I will continue Zyprexa 20 mg at bedtime, Lamictal 150 mg twice a day and Zoloft 100 mg daily.  Recommended  to call us back if he has any question or any concern.  Followup in 3 months.  Timothy Burnett T., MD 08/03/2013

## 2013-08-05 ENCOUNTER — Encounter: Payer: Self-pay | Admitting: Family Medicine

## 2013-08-05 ENCOUNTER — Ambulatory Visit (INDEPENDENT_AMBULATORY_CARE_PROVIDER_SITE_OTHER): Payer: BC Managed Care – PPO | Admitting: Family Medicine

## 2013-08-05 VITALS — BP 110/80 | HR 96 | Temp 98.3°F | Resp 14 | Ht 68.0 in | Wt 181.2 lb

## 2013-08-05 DIAGNOSIS — Z0000 Encounter for general adult medical examination without abnormal findings: Secondary | ICD-10-CM

## 2013-08-05 LAB — CBC WITH DIFFERENTIAL/PLATELET
BASOS PCT: 0.3 % (ref 0.0–3.0)
Basophils Absolute: 0 10*3/uL (ref 0.0–0.1)
Eosinophils Absolute: 0.2 10*3/uL (ref 0.0–0.7)
Eosinophils Relative: 2.8 % (ref 0.0–5.0)
HEMATOCRIT: 43.9 % (ref 39.0–52.0)
HEMOGLOBIN: 14.7 g/dL (ref 13.0–17.0)
LYMPHS ABS: 2 10*3/uL (ref 0.7–4.0)
Lymphocytes Relative: 26.3 % (ref 12.0–46.0)
MCHC: 33.4 g/dL (ref 30.0–36.0)
MCV: 94.1 fl (ref 78.0–100.0)
MONOS PCT: 8 % (ref 3.0–12.0)
Monocytes Absolute: 0.6 10*3/uL (ref 0.1–1.0)
Neutro Abs: 4.8 10*3/uL (ref 1.4–7.7)
Neutrophils Relative %: 62.6 % (ref 43.0–77.0)
PLATELETS: 210 10*3/uL (ref 150.0–400.0)
RBC: 4.66 Mil/uL (ref 4.22–5.81)
RDW: 14.8 % (ref 11.5–15.5)
WBC: 7.7 10*3/uL (ref 4.0–10.5)

## 2013-08-05 LAB — BASIC METABOLIC PANEL
BUN: 15 mg/dL (ref 6–23)
CO2: 25 mEq/L (ref 19–32)
CREATININE: 1.1 mg/dL (ref 0.4–1.5)
Calcium: 9.4 mg/dL (ref 8.4–10.5)
Chloride: 105 mEq/L (ref 96–112)
GFR: 77.36 mL/min (ref >60.00)
Glucose, Bld: 86 mg/dL (ref 70–99)
Potassium: 4 mEq/L (ref 3.5–5.1)
Sodium: 138 mEq/L (ref 135–145)

## 2013-08-05 LAB — LIPID PANEL
CHOL/HDL RATIO: 9
Cholesterol: 301 mg/dL — ABNORMAL HIGH (ref 0–200)
HDL: 35 mg/dL — AB (ref >39.00)
LDL CALC: 203 mg/dL — AB (ref 0–99)
Triglycerides: 315 mg/dL — ABNORMAL HIGH (ref 0.0–149.0)
VLDL: 63 mg/dL — ABNORMAL HIGH (ref 0.0–40.0)

## 2013-08-05 LAB — TSH: TSH: 2.13 u[IU]/mL (ref 0.35–4.50)

## 2013-08-05 LAB — HEPATIC FUNCTION PANEL
ALBUMIN: 4.1 g/dL (ref 3.5–5.2)
ALT: 27 U/L (ref 0–53)
AST: 19 U/L (ref 0–37)
Alkaline Phosphatase: 89 U/L (ref 39–117)
BILIRUBIN TOTAL: 0.9 mg/dL (ref 0.2–1.2)
Bilirubin, Direct: 0.1 mg/dL (ref 0.0–0.3)
Total Protein: 6.6 g/dL (ref 6.0–8.3)

## 2013-08-05 LAB — PSA: PSA: 0.77 ng/mL (ref 0.10–4.00)

## 2013-08-05 MED ORDER — ATORVASTATIN CALCIUM 40 MG PO TABS: ORAL_TABLET | ORAL | Status: DC

## 2013-08-05 NOTE — Assessment & Plan Note (Signed)
Pt's PE WNL.  Check labs.  Anticipatory guidance provided.  

## 2013-08-05 NOTE — Progress Notes (Signed)
   Subjective:    Patient ID: Carder Yin, male    DOB: Nov 21, 1965, 48 y.o.   MRN: [id number]  HPI CPE- no concerns today, fasting for labs.   Review of Systems Patient reports no vision/hearing changes, anorexia, fever ,adenopathy, persistant/recurrent hoarseness, swallowing issues, chest pain, palpitations, edema, persistant/recurrent cough, hemoptysis, dyspnea (rest,exertional, paroxysmal nocturnal), gastrointestinal  bleeding (melena, rectal bleeding), abdominal pain, excessive heart burn, GU symptoms (dysuria, hematuria, voiding/incontinence issues) syncope, focal weakness, memory loss, numbness & tingling, skin/hair/nail changes, depression, anxiety, abnormal bruising/bleeding, musculoskeletal symptoms/signs.     Objective:   Physical Exam BP 110/80  Pulse 96  Temp(Src) 98.3 F (36.8 C) (Oral)  Resp 14  Ht 5\' 8"  (1.727 m)  Wt 181 lb 3.2 oz (82.192 kg)  BMI 27.56 kg/m2  SpO2 98%  General Appearance:    Alert, cooperative, no distress, appears stated age  Head:    Normocephalic, without obvious abnormality, atraumatic  Eyes:    PERRL, conjunctiva/corneas clear, EOM's intact, fundi    benign, both eyes       Ears:    Normal TM's and external ear canals, both ears  Nose:   Nares normal, septum midline, mucosa normal, no drainage   or sinus tenderness  Throat:   Lips, mucosa, and tongue normal; teeth and gums normal  Neck:   Supple, symmetrical, trachea midline, no adenopathy;       thyroid:  No enlargement/tenderness/nodules  Back:     Symmetric, no curvature, ROM normal, no CVA tenderness  Lungs:     Clear to auscultation bilaterally, respirations unlabored  Chest wall:    No tenderness or deformity  Heart:    Regular rate and rhythm, S1 and S2 normal, no murmur, rub   or gallop  Abdomen:     Soft, non-tender, bowel sounds active all four quadrants,    no masses, no organomegaly  Genitalia:    Normal male without lesion, discharge or tenderness  Rectal:    Normal tone,  normal prostate, no masses or tenderness  Extremities:   Extremities normal, atraumatic, no cyanosis or edema  Pulses:   2+ and symmetric all extremities  Skin:   Skin color, texture, turgor normal, no rashes or lesions  Lymph nodes:   Cervical, supraclavicular, and axillary nodes normal  Neurologic:   CNII-XII intact. Normal strength, sensation and reflexes      throughout          Assessment & Plan:

## 2013-08-05 NOTE — Progress Notes (Signed)
Pre visit review using our clinic review tool, if applicable. No additional management support is needed unless otherwise documented below in the visit note. 

## 2013-08-05 NOTE — Patient Instructions (Signed)
Follow up in 6 months to recheck lipids We'll notify you of your lab results and make any changes if needed Call with any questions or concerns Keep up the good work!  You look great! Happy Spring!!

## 2013-08-08 ENCOUNTER — Other Ambulatory Visit: Payer: Self-pay | Admitting: General Practice

## 2013-08-08 MED ORDER — ATORVASTATIN CALCIUM 40 MG PO TABS: ORAL_TABLET | ORAL | Status: DC

## 2013-09-22 ENCOUNTER — Ambulatory Visit (INDEPENDENT_AMBULATORY_CARE_PROVIDER_SITE_OTHER): Payer: BC Managed Care – PPO | Admitting: Psychiatry

## 2013-09-22 ENCOUNTER — Encounter (HOSPITAL_COMMUNITY): Payer: Self-pay | Admitting: Psychiatry

## 2013-09-22 VITALS — BP 124/78 | HR 83 | Ht 69.5 in | Wt 180.0 lb

## 2013-09-22 DIAGNOSIS — F319 Bipolar disorder, unspecified: Secondary | ICD-10-CM

## 2013-09-22 MED ORDER — LORAZEPAM 0.5 MG PO TABS: 0.5000 mg | ORAL_TABLET | Freq: Two times a day (BID) | ORAL | Status: DC

## 2013-09-22 NOTE — Progress Notes (Signed)
[facility] [phone] Progress Note  Volney Reierson [id number] 48 y.o.  09/22/2013 12:17 PM  Chief Complaint:  I'm having a lot of stress.   History of Present Illness: Adante came earlier than his scheduled appointment.  His complaint of increased stress, anxiety and irritability.  He scheduled this appointment because he cannot make for his regular appointment.  The patient endorsed 3 weeks ago he find out that his wife wants a divorce.  Patient was in shock because he could not believe.  However he feels that there may be another man in between and he tried to confront his life but his wife denied.  Patient is complaining for past few weeks irritability, anger, mood swings and poor sleep.  The patient is married for more than 8 years.  This is his second marriage.  He has 3 children from his previous marriage.  The patient denies any suicidal thoughts or homicidal thoughts but admitted feeling of hopelessness, irritability, disappointment and sadness.  He admitted some time short temper with his coworkers.  However he denies any aggression, violence, homicidal thoughts .  He denies any paranoia or any hallucinations.  He is taking his medication and denies any side effects.  He does not believe at this time that he requires any marriage counseling, individual counseling or any group counseling.  He is interested to get some medication so he can calm himself with irritability anger and mood swings.  He is taking Zyprexa 20 mg at bedtime.  He has no side effects of medication.  He said he see his primary care physician and he has blood work which shows high cholesterol, triglyceride and LDL.  Patient admitted not taking his medication for quite some time however he start taking his Lipitor.  Patient is not drinking or using any illegal substances.   H his appetite is okay.  Suicidal Ideation: No Plan Formed: No Patient has means to carry out plan: No  Homicidal Ideation: No Plan Formed: No  Patient has means to carry out plan: No  ROS  Psychiatric: Agitation: Yes Hallucination: No Depressed Mood: Yes Insomnia: Yes Hypersomnia: No Altered Concentration: No Feels Worthless: No Grandiose Ideas: No Belief In Special Powers: No New/Increased Substance Abuse: No Compulsions: No  Neurologic: Headache: No Seizure: No Paresthesias: No  Medical History:  Patient has hyperlipidemia.  He sees Dr. Birdie Riddle.  Outpatient Encounter Prescriptions as of 09/22/2013  Medication Sig  . atorvastatin (LIPITOR) 40 MG tablet TAKE ONE TABLET BY MOUTH AT BEDTIME  . lamoTRIgine (LAMICTAL) 150 MG tablet TAKE ONE TABLET BY MOUTH TWICE DAILY  . OLANZapine (ZYPREXA) 20 MG tablet TAKE ONE TABLET BY MOUTH AT BEDTIME  . sertraline (ZOLOFT) 100 MG tablet TAKE ONE TABLET BY MOUTH ONCE DAILY  . LORazepam (ATIVAN) 0.5 MG tablet Take 1 tablet (0.5 mg total) by mouth 2 (two) times daily.    Past Psychiatric History/Hospitalization(s): Patient had tried Abilify and Geodon in the past with limited improvement.  He has a history of impulsive behavior. Anxiety: Yes Bipolar Disorder: Yes Depression: Yes Mania: Yes Psychosis: No Schizophrenia: No Personality Disorder: No Hospitalization for psychiatric illness: No History of Electroconvulsive Shock Therapy: No Prior Suicide Attempts: No  Physical Exam: Constitutional:  BP 124/78  Pulse 83  Ht 5' 9.5" (1.765 m)  Wt 180 lb (81.647 kg)  BMI 26.21 kg/m2  General Appearance: well nourished, casually dressed and fairly groomed.  Musculoskeletal: Strength & Muscle Tone: within normal limits Gait & Station: normal Patient leans: N/A  Mental Status Examination Patient is casually dressed and fairly groomed.  He appears sad and easily irritable.  His speech is fast but coherent.  His thought processes also fast but logical and goal-directed.  His attention and concentration is fair.  He described his mood as depressed and his affect is constricted.   There were no tremors or shakes.  His psychomotor activity is slightly increased.  His fund of knowledge is adequate.  Denies any auditory or visual hallucination.  He denies any paranoia, delusions or any obsessive thoughts.  He is alert and oriented x3.  There were no flight of ideas or any loose association.  His insight judgment and impulse control is okay.  Established Problem, Stable/Improving (1), Review of Psycho-Social Stressors (1), Review or order clinical lab tests (1), Established Problem, Worsening (2), New Problem, with no additional work-up planned (3), Review of Last Therapy Session (1), Review of Medication Regimen & Side Effects (2) and Review of New Medication or Change in Dosage (2)  Assessment: Axis I: Bipolar disorder  Axis II: Deferred  Axis III:  Patient Active Problem List   Diagnosis Date Noted  . Other and unspecified hyperlipidemia 06/11/2012    Axis IV: Mild to moderate  Axis V: 65-70   Plan:   reassurance given.  The patient is not interested in counseling at this time.  I recommended to try Ativan 0.5 mg twice a day as needed to help his irritability anger and anxiety.  Patient understand it as a situational worsening of the symptoms and he may get better in few weeks.  He is not interested to get individual counseling at this time.  He is taking his medication as prescribed.  I recommended that Ativan does not help him then he should call us .  I also discussed benzodiazepine dependence, tolerance and withdrawal symptoms.  I will see him again in 3 weeks.  Continue his other medications as prescribed which are Zyprexa 20 mg at bedtime, Lamictal 150 mg twice a day and Zoloft 100 mg daily.  Recommended to call us back if he has any question or any concern.  Time spent 25 minutes.  More than 50% of the time spent in psychoeducation, counseling and coordination of care.  Discuss safety plan that anytime having active suicidal thoughts or homicidal thoughts then  patient need to call 911 or go to the local emergency room.   ARFEEN,SYED T., MD 09/22/2013

## 2013-10-28 ENCOUNTER — Ambulatory Visit (INDEPENDENT_AMBULATORY_CARE_PROVIDER_SITE_OTHER): Payer: BC Managed Care – PPO | Admitting: Psychiatry

## 2013-10-28 ENCOUNTER — Encounter (HOSPITAL_COMMUNITY): Payer: Self-pay | Admitting: Psychiatry

## 2013-10-28 DIAGNOSIS — F319 Bipolar disorder, unspecified: Secondary | ICD-10-CM

## 2013-10-28 MED ORDER — OLANZAPINE 20 MG PO TABS: ORAL_TABLET | ORAL | Status: DC

## 2013-10-28 MED ORDER — LAMOTRIGINE 150 MG PO TABS: ORAL_TABLET | ORAL | Status: DC

## 2013-10-28 MED ORDER — SERTRALINE HCL 100 MG PO TABS: ORAL_TABLET | ORAL | Status: DC

## 2013-10-28 NOTE — Progress Notes (Signed)
Timothy Burnett [phone] Progress Note  Timothy Burnett [id number] 48 y.o.  10/28/2013 11:39 AM  Chief Complaint:  I am feeling better.     History of Present Illness: Timothy Burnett came for his appointment.  Last visit he came earlier than his scheduled appointment because he was increase stressed out.  His life is getting divorced .  Patient is now accepting the new reality and he is ready to move on.  He had new place and he is going to move in this weekend.  We provided him Ativan which he is taking some time during the day to help his anxiety and nervousness.  Patient mentioned it makes him dizzy and groggy.  He is taking his medication Zyprexa Lamictal and Zoloft as prescribed.  His appetite is okay.  He denies any dictation, anger, mood swings.  He has very limited contact with his wife.  He is accepting the separation and divorce.  He wanted to start his new life.  His vitals are stable.  His sleep is improved from the past.  Suicidal Ideation: No Plan Formed: No Patient has means to carry out plan: No  Homicidal Ideation: No Plan Formed: No Patient has means to carry out plan: No  ROS  Psychiatric: Agitation: No Hallucination: No Depressed Mood: No Insomnia: No Hypersomnia: No Altered Concentration: No Feels Worthless: No Grandiose Ideas: No Belief In Special Powers: No New/Increased Substance Abuse: No Compulsions: No  Neurologic: Headache: No Seizure: No Paresthesias: No  Medical History:  Patient has hyperlipidemia.  He sees Dr. Birdie Riddle.  Outpatient Encounter Prescriptions as of 10/28/2013  Medication Sig  . atorvastatin (LIPITOR) 40 MG tablet TAKE ONE TABLET BY MOUTH AT BEDTIME  . lamoTRIgine (LAMICTAL) 150 MG tablet TAKE ONE TABLET BY MOUTH TWICE DAILY  . LORazepam (ATIVAN) 0.5 MG tablet Take 1 tablet (0.5 mg total) by mouth 2 (two) times daily.  Marland Kitchen OLANZapine (ZYPREXA) 20 MG tablet TAKE ONE TABLET BY MOUTH AT BEDTIME  . sertraline (ZOLOFT) 100 MG tablet TAKE ONE  TABLET BY MOUTH ONCE DAILY  . [DISCONTINUED] lamoTRIgine (LAMICTAL) 150 MG tablet TAKE ONE TABLET BY MOUTH TWICE DAILY  . [DISCONTINUED] OLANZapine (ZYPREXA) 20 MG tablet TAKE ONE TABLET BY MOUTH AT BEDTIME  . [DISCONTINUED] sertraline (ZOLOFT) 100 MG tablet TAKE ONE TABLET BY MOUTH ONCE DAILY    Past Psychiatric History/Hospitalization(s): Patient had tried Abilify and Geodon in the past with limited improvement.  He has a history of impulsive behavior. Anxiety: Yes Bipolar Disorder: Yes Depression: Yes Mania: Yes Psychosis: No Schizophrenia: No Personality Disorder: No Hospitalization for psychiatric illness: No History of Electroconvulsive Shock Therapy: No Prior Suicide Attempts: No  Physical Exam: Constitutional:  There were no vitals taken for this visit.  General Appearance: well nourished, casually dressed and fairly groomed.  Musculoskeletal: Strength & Muscle Tone: within normal limits Gait & Station: normal Patient leans: N/A  Mental Status Examination Patient is casually dressed and fairly groomed.  He appears anxious but cooperative.  His speech is fast but coherent.  His thought process logical and goal-directed.  His attention and concentration is fair.  He described his mood as sad and his affect is constricted.  There were no tremors or shakes.  His psychomotor activity is slightly increased.  His fund of knowledge is adequate.  He denies any auditory or visual hallucination.  He denies any paranoia, delusions or any obsessive thoughts.  He is alert and oriented x3.  There were no flight of ideas or any loose association.  His insight judgment and impulse control is okay.  Established Problem, Stable/Improving (1), Review of Psycho-Social Stressors (1), Review of Last Therapy Session (1) and Review of Medication Regimen & Side Effects (2)  Assessment: Axis I: Bipolar disorder  Axis II: Deferred  Axis III:  Patient Active Problem List   Diagnosis Date Noted   . Other and unspecified hyperlipidemia 06/11/2012    Axis IV: Mild to moderate  Axis V: 65-70   Plan:  Patient is doing better from his last visit.  He is now accepting the separation.  He is going to move out this weekend.  I recommended to take Ativan at bedtime if it makes him sleepy.  Continue his other medications as prescribed.  He liked to followup in 4 weeks.  We will provide his prescription for 30 days until I see her in 4 weeks.  Recommended to call us back if he has any question or any concern.  Discussed safety plan.  ARFEEN,SYED T., MD 10/28/2013

## 2013-11-03 ENCOUNTER — Ambulatory Visit (HOSPITAL_COMMUNITY): Payer: Self-pay | Admitting: Psychiatry

## 2013-12-02 ENCOUNTER — Ambulatory Visit (HOSPITAL_COMMUNITY): Payer: Self-pay | Admitting: Psychiatry

## 2013-12-20 ENCOUNTER — Other Ambulatory Visit (HOSPITAL_COMMUNITY): Payer: Self-pay | Admitting: Psychiatry

## 2013-12-27 ENCOUNTER — Encounter (HOSPITAL_COMMUNITY): Payer: Self-pay | Admitting: Psychiatry

## 2013-12-27 ENCOUNTER — Ambulatory Visit (INDEPENDENT_AMBULATORY_CARE_PROVIDER_SITE_OTHER): Payer: BC Managed Care – PPO | Admitting: Psychiatry

## 2013-12-27 VITALS — BP 118/78 | HR 92 | Ht 69.5 in | Wt 173.0 lb

## 2013-12-27 DIAGNOSIS — F319 Bipolar disorder, unspecified: Secondary | ICD-10-CM

## 2013-12-27 MED ORDER — LAMOTRIGINE 150 MG PO TABS: ORAL_TABLET | ORAL | Status: DC

## 2013-12-27 MED ORDER — LORAZEPAM 0.5 MG PO TABS: 0.5000 mg | ORAL_TABLET | Freq: Every day | ORAL | Status: DC

## 2013-12-27 MED ORDER — OLANZAPINE 20 MG PO TABS: 20.0000 mg | ORAL_TABLET | Freq: Every day | ORAL | Status: DC

## 2013-12-27 MED ORDER — SERTRALINE HCL 100 MG PO TABS: ORAL_TABLET | ORAL | Status: DC

## 2013-12-27 NOTE — Progress Notes (Signed)
Timothy Burnett  Timothy Burnett [id number] 48 y.o.  12/27/2013 1:41 PM  Chief Complaint:  Medication management and followup.       History of Present Illness: Timothy Burnett came for his appointment.  He is taking his medication and denies any side effects.  He cut down his Ativan and will be taking at bedtime.  He is sleeping better.  He is handling his separation better now .  He has been in touch with his wife talking about separation agreement but is working very well.  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  His appetite is okay.  He had a quite birthday however he is going next week with his parents to the beach.  His appetite is okay.  His vitals are stable.  He denies any paranoia or any hallucination.  Suicidal Ideation: No Plan Formed: No Patient has means to carry out plan: No  Homicidal Ideation: No Plan Formed: No Patient has means to carry out plan: No  ROS  Psychiatric: Agitation: No Hallucination: No Depressed Mood: No Insomnia: No Hypersomnia: No Altered Concentration: No Feels Worthless: No Grandiose Ideas: No Belief In Special Powers: No New/Increased Substance Abuse: No Compulsions: No  Neurologic: Headache: No Seizure: No Paresthesias: No  Medical History:  Patient has hyperlipidemia.  He sees Dr. Birdie Riddle.  Outpatient Encounter Prescriptions as of 12/27/2013  Medication Sig  . atorvastatin (LIPITOR) 40 MG tablet TAKE ONE TABLET BY MOUTH AT BEDTIME  . lamoTRIgine (LAMICTAL) 150 MG tablet TAKE ONE TABLET BY MOUTH TWICE DAILY  . LORazepam (ATIVAN) 0.5 MG tablet Take 1 tablet (0.5 mg total) by mouth at bedtime.  Marland Kitchen OLANZapine (ZYPREXA) 20 MG tablet Take 1 tablet (20 mg total) by mouth at bedtime.  . sertraline (ZOLOFT) 100 MG tablet TAKE ONE TABLET BY MOUTH ONCE DAILY  . [DISCONTINUED] lamoTRIgine (LAMICTAL) 150 MG tablet TAKE ONE TABLET BY MOUTH TWICE  DAILY  . [DISCONTINUED] LORazepam (ATIVAN) 0.5 MG tablet Take 1 tablet (0.5 mg total) by mouth 2 (two) times daily.  . [DISCONTINUED] OLANZapine (ZYPREXA) 20 MG tablet TAKE ONE TABLET BY MOUTH AT BEDTIME  . [DISCONTINUED] sertraline (ZOLOFT) 100 MG tablet TAKE ONE TABLET BY MOUTH ONCE DAILY    Past Psychiatric History/Hospitalization(s): Patient had tried Abilify and Geodon in the past with limited improvement.  He has a history of impulsive behavior. Anxiety: Yes Bipolar Disorder: Yes Depression: Yes Mania: Yes Psychosis: No Schizophrenia: No Personality Disorder: No Hospitalization for psychiatric illness: No History of Electroconvulsive Shock Therapy: No Prior Suicide Attempts: No  Physical Exam: Constitutional:  BP 118/78  Pulse 92  Ht 5' 9.5" (1.765 m)  Wt 173 lb (78.472 kg)  BMI 25.19 kg/m2  General Appearance: well nourished, casually dressed and fairly groomed.  Musculoskeletal: Strength & Muscle Tone: within normal limits Gait & Station: normal Patient leans: N/A  Mental Status Examination Patient is casually dressed and fairly groomed.  He appears anxious but cooperative.  His speech is coherent.  His thought process logical and goal-directed.  His attention and concentration is fair.  He described his mood neutral and his affect is mood appropriate.  There were no tremors or shakes.  His psychomotor activity is within normal limits.  His fund of knowledge is adequate.  He denies any auditory or visual hallucination.  He denies any paranoia, delusions or any obsessive thoughts.  He is alert and oriented x3.  There were no flight of ideas or any loose association.  His insight judgment and impulse control is okay.  Established Problem, Stable/Improving (1), Review of Psycho-Social Stressors (1), Review of Last Therapy Session (1) and Review of Medication Regimen & Side Effects (2)  Assessment: Axis I: Bipolar disorder  Axis II: Deferred  Axis III:  Patient Active  Problem List   Diagnosis Date Noted  . Other and unspecified hyperlipidemia 06/11/2012    Axis IV: Mild to moderate  Axis V: 65-70   Plan:  Patient is doing better on his current medication.  He has cut down the Ativan and only taking at bedtime.  from his last visit.  He is now accepting the separation.  He is going to move out this weekend.  He denies any tremors, shakes, itching or any rash.  I will continue Lamictal 150 mg twice a day, Zyprexa 20 mg at bedtime , Zoloft 100 mg daily .  I would reduce Ativan 0.5 mg at bedtime.  Recommended to call his back if he has any question or any concern.  I will see him again in 3 months.  ARFEEN,SYED T., MD 12/27/2013

## 2014-02-01 ENCOUNTER — Ambulatory Visit: Payer: BC Managed Care – PPO | Admitting: Family Medicine

## 2014-02-06 ENCOUNTER — Encounter: Payer: Self-pay | Admitting: Family Medicine

## 2014-02-06 ENCOUNTER — Ambulatory Visit (INDEPENDENT_AMBULATORY_CARE_PROVIDER_SITE_OTHER): Payer: BC Managed Care – PPO | Admitting: Family Medicine

## 2014-02-06 VITALS — BP 116/80 | HR 75 | Temp 98.5°F | Resp 16 | Wt 172.2 lb

## 2014-02-06 DIAGNOSIS — E785 Hyperlipidemia, unspecified: Secondary | ICD-10-CM

## 2014-02-06 LAB — BASIC METABOLIC PANEL
BUN: 16 mg/dL (ref 6–23)
CALCIUM: 9.3 mg/dL (ref 8.4–10.5)
CO2: 24 mEq/L (ref 19–32)
CREATININE: 1 mg/dL (ref 0.4–1.5)
Chloride: 106 mEq/L (ref 96–112)
GFR: 82.46 mL/min (ref >60.00)
GLUCOSE: 89 mg/dL (ref 70–99)
Potassium: 4.8 mEq/L (ref 3.5–5.1)
Sodium: 141 mEq/L (ref 135–145)

## 2014-02-06 LAB — HEPATIC FUNCTION PANEL
ALT: 16 U/L (ref 0–53)
AST: 13 U/L (ref 0–37)
Albumin: 3.5 g/dL (ref 3.5–5.2)
Alkaline Phosphatase: 94 U/L (ref 39–117)
Bilirubin, Direct: 0 mg/dL (ref 0.0–0.3)
TOTAL PROTEIN: 7 g/dL (ref 6.0–8.3)
Total Bilirubin: 0.2 mg/dL (ref 0.2–1.2)

## 2014-02-06 LAB — LIPID PANEL
CHOLESTEROL: 164 mg/dL (ref 0–200)
HDL: 29.4 mg/dL — AB (ref >39.00)
LDL CALC: 95 mg/dL (ref 0–99)
NonHDL: 134.6
Total CHOL/HDL Ratio: 6
Triglycerides: 200 mg/dL — ABNORMAL HIGH (ref 0.0–149.0)
VLDL: 40 mg/dL (ref 0.0–40.0)

## 2014-02-06 NOTE — Patient Instructions (Signed)
Schedule your complete physical in 6 months We'll notify you of your lab results and make any changes if needed Keep up the good work!  You look great!!! Call with any questions or concerns Happy Holidays!! 

## 2014-02-06 NOTE — Progress Notes (Signed)
   Subjective:    Patient ID: Jaxsyn Azam, male    DOB: 09-14-1965, 48 y.o.   MRN: [id number]  HPI Hyperlipidemia- chronic problem, on Lipitor nightly.  Denies abd pain, N/V, myalgias.  On long term Lamictal which can cause elevated levels.  Pt is now working 3rd shift.  Has lost some weight.   Review of Systems For ROS see HPI     Objective:   Physical Exam  Constitutional: He is oriented to person, place, and time. He appears well-developed and well-nourished. No distress.  HENT:  Head: Normocephalic and atraumatic.  Eyes: Conjunctivae and EOM are normal. Pupils are equal, round, and reactive to light.  Neck: Normal range of motion. Neck supple. No thyromegaly present.  Cardiovascular: Normal rate, regular rhythm, normal heart sounds and intact distal pulses.   No murmur heard. Pulmonary/Chest: Effort normal and breath sounds normal. No respiratory distress.  Abdominal: Soft. Bowel sounds are normal. He exhibits no distension.  Musculoskeletal: He exhibits no edema.  Lymphadenopathy:    He has no cervical adenopathy.  Neurological: He is alert and oriented to person, place, and time. No cranial nerve deficit.  Skin: Skin is warm and dry.  Psychiatric: He has a normal mood and affect. His behavior is normal.  Vitals reviewed.         Assessment & Plan:

## 2014-02-06 NOTE — Progress Notes (Signed)
Pre visit review using our clinic review tool, if applicable. No additional management support is needed unless otherwise documented below in the visit note. 

## 2014-02-06 NOTE — Assessment & Plan Note (Signed)
Chronic problem.  Tolerating statin w/o difficulty.  Check labs.  Adjust meds prn  

## 2014-02-27 ENCOUNTER — Encounter: Payer: Self-pay | Admitting: Family Medicine

## 2014-03-13 ENCOUNTER — Other Ambulatory Visit (HOSPITAL_COMMUNITY): Payer: Self-pay | Admitting: Psychiatry

## 2014-03-20 ENCOUNTER — Other Ambulatory Visit (HOSPITAL_COMMUNITY): Payer: Self-pay | Admitting: *Deleted

## 2014-03-20 MED ORDER — LORAZEPAM 0.5 MG PO TABS: 0.5000 mg | ORAL_TABLET | Freq: Every day | ORAL | Status: DC

## 2014-03-20 NOTE — Telephone Encounter (Signed)
RX for Ativan called in to pharmacy - printed in error

## 2014-03-28 ENCOUNTER — Ambulatory Visit (HOSPITAL_COMMUNITY): Payer: Self-pay | Admitting: Psychiatry

## 2014-04-06 ENCOUNTER — Ambulatory Visit (HOSPITAL_COMMUNITY): Payer: Self-pay | Admitting: Psychiatry

## 2014-04-07 ENCOUNTER — Encounter (HOSPITAL_COMMUNITY): Payer: Self-pay | Admitting: Psychiatry

## 2014-04-07 ENCOUNTER — Ambulatory Visit (INDEPENDENT_AMBULATORY_CARE_PROVIDER_SITE_OTHER): Payer: BLUE CROSS/BLUE SHIELD | Admitting: Psychiatry

## 2014-04-07 VITALS — BP 117/71 | HR 86 | Ht 69.0 in | Wt 177.6 lb

## 2014-04-07 DIAGNOSIS — F319 Bipolar disorder, unspecified: Secondary | ICD-10-CM

## 2014-04-07 MED ORDER — LORAZEPAM 0.5 MG PO TABS: 0.5000 mg | ORAL_TABLET | Freq: Every day | ORAL | Status: DC

## 2014-04-07 MED ORDER — OLANZAPINE 20 MG PO TABS: 20.0000 mg | ORAL_TABLET | Freq: Every day | ORAL | Status: DC

## 2014-04-07 MED ORDER — SERTRALINE HCL 100 MG PO TABS: ORAL_TABLET | ORAL | Status: DC

## 2014-04-07 MED ORDER — LAMOTRIGINE 150 MG PO TABS: ORAL_TABLET | ORAL | Status: DC

## 2014-04-07 NOTE — Progress Notes (Signed)
Timothy [place]ett  Timothy Burnett [id number] 49 y.o.  04/07/2014 10:33 AM  Chief Complaint:  Medication management and followup.       History of Present Illness: Timothy [place]ett came for his appointment.  He is compliant with his medication and denies any side effects.  He is working 4 days a week and than 4 days off.  He does not like to schedule because it does not give him rest very well.  He is also not sure when to take medication because when he is off he sleeping too much and does not want to take Zyprexa.  However he is been taking because he knew without medication he would get sick.  He was working on the Christmas but he was able to see his father.  He has talked to his ex-wife twice and currently he is separated.  He is trying to keep communication and also in touch with his daughter who lives in [place].  Overall his mood is a stable other than he's been tired and fatigued.  He denies any agitation, anger or any irritability.  He has a lot of stress from his work.  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  Patient denies drinking or using any illegal substances.  He recently seen his primary care physician and he has blood work.  His cholesterol is better but he still need to take cholesterol-lowering medication.  His appetite is okay.  His vitals are stable.    Suicidal Ideation: No Plan Formed: No Patient has means to carry out plan: No  Homicidal Ideation: No Plan Formed: No Patient has means to carry out plan: No  Review of Systems  Constitutional: Positive for malaise/fatigue.  Skin: Negative for itching and rash.  Psychiatric/Behavioral: The patient has insomnia.     Psychiatric: Agitation: No Hallucination: No Depressed Mood: No Insomnia: No Hypersomnia: No Altered Concentration: No Feels Worthless: No Grandiose Ideas: No Belief In Special Powers: No New/Increased Substance Abuse: No Compulsions: No  Neurologic: Headache:  No Seizure: No Paresthesias: No  Medical History:  Patient has hyperlipidemia.  He sees Dr. Birdie Riddle.  Outpatient Encounter Prescriptions as of 04/07/2014  Medication Sig  . atorvastatin (LIPITOR) 40 MG tablet TAKE ONE TABLET BY MOUTH AT BEDTIME  . lamoTRIgine (LAMICTAL) 150 MG tablet TAKE ONE TABLET BY MOUTH TWICE DAILY  . LORazepam (ATIVAN) 0.5 MG tablet Take 1 tablet (0.5 mg total) by mouth at bedtime.  Marland Kitchen OLANZapine (ZYPREXA) 20 MG tablet Take 1 tablet (20 mg total) by mouth at bedtime.  . sertraline (ZOLOFT) 100 MG tablet TAKE ONE TABLET BY MOUTH ONCE DAILY  . [DISCONTINUED] lamoTRIgine (LAMICTAL) 150 MG tablet TAKE ONE TABLET BY MOUTH TWICE DAILY  . [DISCONTINUED] LORazepam (ATIVAN) 0.5 MG tablet Take 1 tablet (0.5 mg total) by mouth at bedtime.  . [DISCONTINUED] OLANZapine (ZYPREXA) 20 MG tablet Take 1 tablet (20 mg total) by mouth at bedtime.  . [DISCONTINUED] sertraline (ZOLOFT) 100 MG tablet TAKE ONE TABLET BY MOUTH ONCE DAILY    Past Psychiatric History/Hospitalization(s): Patient had tried Abilify and Geodon in the past with limited improvement.  He has a history of impulsive behavior. Anxiety: Yes Bipolar Disorder: Yes Depression: Yes Mania: Yes Psychosis: No Schizophrenia: No Personality Disorder: No Hospitalization for psychiatric illness: No History of Electroconvulsive Shock Therapy: No Prior Suicide Attempts: No  Physical Exam: Constitutional:  BP 117/71 mmHg  Pulse 86  Ht 5\' 9"  (1.753 m)  Wt 177 lb 9.6  oz (80.559 kg)  BMI 26.22 kg/m2    Recent Results (from the past 2160 hour(s))  Lipid panel     Status: Abnormal   Collection Time: 02/06/14  9:51 AM  Result Value Ref Range   Cholesterol 164 0 - 200 mg/dL    Comment: ATP III Classification       Desirable:  < 200 mg/dL               Borderline High:  200 - 239 mg/dL          High:  > = 240 mg/dL   Triglycerides 200.0 (H) 0.0 - 149.0 mg/dL    Comment: Normal:  <150 mg/dLBorderline High:  150 - 199 mg/dL    HDL 29.40 (L) >39.00 mg/dL   VLDL 40.0 0.0 - 40.0 mg/dL   LDL Cholesterol 95 0 - 99 mg/dL   Total CHOL/HDL Ratio 6     Comment:                Men          Women1/2 Average Risk     3.4          3.3Average Risk          5.0          4.42X Average Risk          9.6          7.13X Average Risk          15.0          11.0                       NonHDL 134.60     Comment: Burn[place]:  Non-HDL goal should be 30 mg/dL higher than patient's LDL goal (i.e. LDL goal of < 70 mg/dL, would have non-HDL goal of < 100 mg/dL)  Basic metabolic panel     Status: None   Collection Time: 02/06/14  9:51 AM  Result Value Ref Range   Sodium 141 135 - 145 mEq/L   Potassium 4.8 3.5 - 5.1 mEq/L   Chloride 106 96 - 112 mEq/L   CO2 24 19 - 32 mEq/L   Glucose, Bld 89 70 - 99 mg/dL   BUN 16 6 - 23 mg/dL   Creatinine, Ser 1.0 0.4 - 1.5 mg/dL   Calcium 9.3 8.4 - 10.5 mg/dL   GFR 82.46 >60.00 mL/min  Hepatic function panel     Status: None   Collection Time: 02/06/14  9:51 AM  Result Value Ref Range   Total Bilirubin 0.2 0.2 - 1.2 mg/dL   Bilirubin, Direct 0.0 0.0 - 0.3 mg/dL   Alkaline Phosphatase 94 39 - 117 U/L   AST 13 0 - 37 U/L   ALT 16 0 - 53 U/L   Total Protein 7.0 6.0 - 8.3 g/dL   Albumin 3.5 3.5 - 5.2 g/dL    General Appearance: well nourished, casually dressed and fairly groomed.  Musculoskeletal: Strength & Muscle Tone: within normal limits Gait & Station: normal Patient leans: N/A  Mental Status Examination Patient is casually dressed and fairly groomed.  He appears tired but cooperative.  His speech is coherent.  His thought process logical and goal-directed.  His attention and concentration is fair.  He described his mood neutral and his affect is mood appropriate.  There were no tremors or shakes.  His psychomotor activity is within normal limits.  His fund of knowledge is  adequate.  He denies any auditory or visual hallucination.  He denies any paranoia, delusions or any obsessive thoughts.  He is  alert and oriented x3.  There were no flight of ideas or any loose association.  His insight judgment and impulse control is okay.  Established Problem, Stable/Improving (1), Review of Psycho-Social Stressors (1), Review or order clinical lab tests (1), Review and summation of old records (2), Review of Last Therapy Session (1) and Review of Medication Regimen & Side Effects (2)  Assessment: Axis I: Bipolar disorder  Axis II: Deferred  Axis III:  Patient Active Problem List   Diagnosis Date Noted  . Hyperlipidemia 06/11/2012    Axis IV: Mild to moderate  Axis V: 65-70   Plan:  Reassurance given.  Explain to take the medication as prescribed and take Zyprexa and Ativan before he goes to bed and take Lamictal and Zoloft during the day.  Discussed medication side effects and benefits.  I also reviewed blood work.  His blood sugar is normal.  Patient does not have any side effects of medication.  He is not interested in counseling.  He is more in acceptance of separation but like to keep communication on.  I will continue Lamictal 300 mg daily , Zyprexa 20 mg at bedtime , Zoloft 100 mg daily and Ativan 0.5 mg at bedtime.  Recommended to call his back if he has any question or any concern.  I will see him again in 3 months. Time spent 25 minutes.  More than 50% of the time spent in psychoeducation, counseling and coordination of care.  Discuss safety plan that anytime having active suicidal thoughts or homicidal thoughts then patient need to call 911 or go to the local emergency room.    Timothy Burnett T., MD 04/07/2014

## 2014-07-07 ENCOUNTER — Ambulatory Visit (INDEPENDENT_AMBULATORY_CARE_PROVIDER_SITE_OTHER): Payer: BLUE CROSS/BLUE SHIELD | Admitting: Psychiatry

## 2014-07-07 ENCOUNTER — Encounter (HOSPITAL_COMMUNITY): Payer: Self-pay | Admitting: Psychiatry

## 2014-07-07 VITALS — BP 125/82 | HR 84 | Ht 69.0 in | Wt 176.6 lb

## 2014-07-07 DIAGNOSIS — F319 Bipolar disorder, unspecified: Secondary | ICD-10-CM | POA: Diagnosis not present

## 2014-07-07 MED ORDER — OLANZAPINE 20 MG PO TABS: 20.0000 mg | ORAL_TABLET | Freq: Every day | ORAL | Status: DC

## 2014-07-07 MED ORDER — LORAZEPAM 0.5 MG PO TABS: 0.5000 mg | ORAL_TABLET | Freq: Every day | ORAL | Status: DC

## 2014-07-07 MED ORDER — LAMOTRIGINE 150 MG PO TABS: ORAL_TABLET | ORAL | Status: DC

## 2014-07-07 MED ORDER — SERTRALINE HCL 100 MG PO TABS: ORAL_TABLET | ORAL | Status: DC

## 2014-07-07 NOTE — Progress Notes (Signed)
Timothy Burnett  Timothy Burnett [id number] 49 y.o.  07/07/2014 11:21 AM  Chief Complaint:  Medication management and followup.       History of Present Illness: Timothy Burnett came for his appointment.  He is taking his medication as prescribed.  He continues to have some time good days and bad days at work.  But overall he is handling his job very well.  He denies any recent irritability, anger or any severe mood swing.  He sleeping better.  He is taking Ativan most of the time being is helping his anxiety and mood.  He is planning to visit his children who lives in [place]  He's been talking to his ex-wife because he wants to keep taking medication for his children.  Patient denies any rash, irritability, tremors or any shakes.  His appetite is okay.  His vitals are stable.  He denies drinking or using any illegal substances.  Suicidal Ideation: No Plan Formed: No Patient has means to carry out plan: No  Homicidal Ideation: No Plan Formed: No Patient has means to carry out plan: No  ROS  Psychiatric: Agitation: No Hallucination: No Depressed Mood: No Insomnia: No Hypersomnia: No Altered Concentration: No Feels Worthless: No Grandiose Ideas: No Belief In Special Powers: No New/Increased Substance Abuse: No Compulsions: No  Neurologic: Headache: No Seizure: No Paresthesias: No  Medical History:  Patient has hyperlipidemia.  He sees Dr. Birdie Riddle.  Outpatient Encounter Prescriptions as of 07/07/2014  Medication Sig  . Multiple Vitamin (MULTIVITAMIN) tablet Take 1 tablet by mouth daily.  Marland Kitchen atorvastatin (LIPITOR) 40 MG tablet TAKE ONE TABLET BY MOUTH AT BEDTIME  . lamoTRIgine (LAMICTAL) 150 MG tablet TAKE ONE TABLET BY MOUTH TWICE DAILY  . LORazepam (ATIVAN) 0.5 MG tablet Take 1 tablet (0.5 mg total) by mouth at bedtime.  Marland Kitchen OLANZapine (ZYPREXA) 20 MG tablet Take 1 tablet (20 mg total) by mouth at bedtime.  . sertraline (ZOLOFT) 100 MG tablet TAKE ONE TABLET  BY MOUTH ONCE DAILY  . [DISCONTINUED] lamoTRIgine (LAMICTAL) 150 MG tablet TAKE ONE TABLET BY MOUTH TWICE DAILY  . [DISCONTINUED] LORazepam (ATIVAN) 0.5 MG tablet Take 1 tablet (0.5 mg total) by mouth at bedtime.  . [DISCONTINUED] OLANZapine (ZYPREXA) 20 MG tablet Take 1 tablet (20 mg total) by mouth at bedtime.  . [DISCONTINUED] sertraline (ZOLOFT) 100 MG tablet TAKE ONE TABLET BY MOUTH ONCE DAILY    Past Psychiatric History/Hospitalization(s): Patient had tried Abilify and Geodon in the past with limited improvement.  He has a history of impulsive behavior. Anxiety: Yes Bipolar Disorder: Yes Depression: Yes Mania: Yes Psychosis: No Schizophrenia: No Personality Disorder: No Hospitalization for psychiatric illness: No History of Electroconvulsive Shock Therapy: No Prior Suicide Attempts: No  Physical Exam: Constitutional:  BP 125/82 mmHg  Pulse 84  Ht 5\' 9"  (1.753 m)  Wt 176 lb 9.6 oz (80.105 kg)  BMI 26.07 kg/m2    No results found for this or any previous visit (from the past 2160 hour(s)).  General Appearance: well nourished, casually dressed and fairly groomed.  Musculoskeletal: Strength & Muscle Tone: within normal limits Gait & Station: normal Patient leans: N/A  Mental Status Examination Patient is casually dressed and fairly groomed.  He appears tired but cooperative.  His speech is coherent.  His thought process logical and goal-directed.  His attention and concentration is fair.  He described his mood neutral and his affect is mood appropriate.  There were no tremors or shakes.  His psychomotor activity  is within normal limits.  His fund of knowledge is adequate.  He denies any auditory or visual hallucination.  He denies any paranoia, delusions or any obsessive thoughts.  He is alert and oriented x3.  There were no flight of ideas or any loose association.  His insight judgment and impulse control is okay.  Established Problem, Stable/Improving (1), Review of  Psycho-Social Stressors (1), Review of Last Therapy Session (1) and Review of Medication Regimen & Side Effects (2)  Assessment: Axis I: Bipolar disorder  Axis II: Deferred  Axis III:  Patient Active Problem List   Diagnosis Date Noted  . Hyperlipidemia 06/11/2012    Plan:  Patient is doing better on his current medication.  He has no rash or itching with the Lamictal.  He denies any other side effects.  I will continue Zyprexa 20 mg daily, Lamictal 300 mg daily Zoloft 100 mg daily and Ativan 0.5 mg at bedtime.  Patient requires a 90 day prescription office psychotropic medication.  Recommended to call us back if he has any question or any concern.  Follow-up in 3 months.   ARFEEN,SYED T., MD 07/07/2014

## 2014-07-18 ENCOUNTER — Telehealth: Payer: Self-pay | Admitting: Family Medicine

## 2014-07-18 NOTE — Telephone Encounter (Signed)
Pre Visit letter sent  °

## 2014-07-21 ENCOUNTER — Telehealth (HOSPITAL_COMMUNITY): Payer: Self-pay | Admitting: *Deleted

## 2014-07-21 NOTE — Telephone Encounter (Signed)
Patient's appointment for 10-06-14 needs to be rescheduled--provider out. LMOM for patient to call us back to r/s.

## 2014-07-24 ENCOUNTER — Telehealth (HOSPITAL_COMMUNITY): Payer: Self-pay | Admitting: *Deleted

## 2014-07-24 NOTE — Telephone Encounter (Signed)
Need to r/s appointment 2nd attempt [facility] for patient to call back

## 2014-07-25 ENCOUNTER — Telehealth (HOSPITAL_COMMUNITY): Payer: Self-pay

## 2014-08-08 ENCOUNTER — Encounter: Payer: Self-pay | Admitting: Family Medicine

## 2014-09-06 ENCOUNTER — Other Ambulatory Visit: Payer: Self-pay | Admitting: General Practice

## 2014-09-06 MED ORDER — ATORVASTATIN CALCIUM 40 MG PO TABS: ORAL_TABLET | ORAL | Status: DC

## 2014-10-06 ENCOUNTER — Ambulatory Visit (HOSPITAL_COMMUNITY): Payer: Self-pay | Admitting: Psychiatry

## 2014-10-16 ENCOUNTER — Other Ambulatory Visit (HOSPITAL_COMMUNITY): Payer: Self-pay | Admitting: Psychiatry

## 2014-10-16 DIAGNOSIS — F3162 Bipolar disorder, current episode mixed, moderate: Secondary | ICD-10-CM

## 2014-10-17 NOTE — Telephone Encounter (Signed)
Refills of patient's Zyprexa, Zoloft and Lamictal authorized by Dr. Adele Schilder and e-scribed to patient's [facility] in [place].  Left patient a message on his mobile phone of need to reschedule an appointment with Dr. Adele Schilder for any further refills as patient's appointment from 10/06/14 was cancelled due to Dr. Adele Schilder being out of town.

## 2014-11-06 ENCOUNTER — Encounter (HOSPITAL_COMMUNITY): Payer: Self-pay | Admitting: Psychiatry

## 2014-11-06 ENCOUNTER — Ambulatory Visit (INDEPENDENT_AMBULATORY_CARE_PROVIDER_SITE_OTHER): Payer: BLUE CROSS/BLUE SHIELD | Admitting: Psychiatry

## 2014-11-06 VITALS — BP 124/78 | HR 83 | Ht 68.0 in | Wt 181.8 lb

## 2014-11-06 DIAGNOSIS — F319 Bipolar disorder, unspecified: Secondary | ICD-10-CM

## 2014-11-06 NOTE — Progress Notes (Signed)
[name]  Timothy Burnett [id number] 49 y.o.  11/06/2014 9:05 AM  Chief Complaint:  Medication management and followup.       History of Present Illness: Timothy [place] came for his appointment.  He is happy since he is able to get morning shift at work.  He sleeping better.  He is also moved to his new place and he likes it.  He is taking his medication as prescribed.  He denies any irritability anger or any mood swing.  He continues to keep in touch with his children who live in [place].  He is hoping to visit them in Spring .  Patient has cut down his Ativan and only takes but is very anxious.  He denies any hallucination or any paranoia.  He is separated from her wife for more than a year and now he is planning to file a divorce.  His wife lives in [place] and he had very limited contact with her.  Patient told he is doing very well and he moved on.  Patient denies drinking or using any illegal substances.  He is scheduled to see his primary care physician in October for physical and irritable checkup.  His appetite is good.  He sleeping good.  His vitals are stable.  He has no tremors or shakes.  Suicidal Ideation: No Plan Formed: No Patient has means to carry out plan: No  Homicidal Ideation: No Plan Formed: No Patient has means to carry out plan: No  Review of Systems  Skin: Negative for itching and rash.  Neurological: Negative for tremors and headaches.    Psychiatric: Agitation: No Hallucination: No Depressed Mood: No Insomnia: No Hypersomnia: No Altered Concentration: No Feels Worthless: No Grandiose Ideas: No Belief In Special Powers: No New/Increased Substance Abuse: No Compulsions: No  Neurologic: Headache: No Seizure: No Paresthesias: No  Medical History:  Patient has hyperlipidemia.  He sees Dr. Birdie Riddle.  Outpatient Encounter Prescriptions as of 11/06/2014  Medication Sig  . atorvastatin (LIPITOR) 40 MG tablet TAKE ONE TABLET BY  MOUTH AT BEDTIME  . lamoTRIgine (LAMICTAL) 150 MG tablet TAKE ONE TABLET BY MOUTH TWICE DAILY  . LORazepam (ATIVAN) 0.5 MG tablet Take 1 tablet (0.5 mg total) by mouth at bedtime.  . Multiple Vitamin (MULTIVITAMIN) tablet Take 1 tablet by mouth daily.  Marland Kitchen OLANZapine (ZYPREXA) 20 MG tablet TAKE ONE TABLET BY MOUTH AT BEDTIME  . sertraline (ZOLOFT) 100 MG tablet TAKE ONE TABLET BY MOUTH ONCE DAILY   No facility-administered encounter medications on file as of 11/06/2014.    Past Psychiatric History/Hospitalization(s): Patient had tried Abilify and Geodon in the past with limited improvement.  He has a history of impulsive behavior. Anxiety: Yes Bipolar Disorder: Yes Depression: Yes Mania: Yes Psychosis: No Schizophrenia: No Personality Disorder: No Hospitalization for psychiatric illness: No History of Electroconvulsive Shock Therapy: No Prior Suicide Attempts: No  Physical Exam: Constitutional:  BP 124/78 mmHg  Pulse 83  Ht 5\' 8"  (1.727 m)  Wt 181 lb 12.8 oz (82.464 kg)  BMI 27.65 kg/m2    No results found for this or any previous visit (from the past 2160 hour(s)).  General Appearance: well nourished, casually dressed and fairly groomed.  Musculoskeletal: Strength & Muscle Tone: within normal limits Gait & Station: normal Patient leans: N/A  Mental Status Examination Patient is well dressed and groomed.  He maintained good eye contact.  He is pleasant and cooperative. His speech is coherent.  His thought process logical  and goal-directed.  His attention and concentration is good.  He described his mood euthymic and his affect is appropriate.  There were no tremors or shakes.  His psychomotor activity is within normal limits.  His fund of knowledge is adequate.  He denies any auditory or visual hallucination.  He denies any paranoia, delusions or any obsessive thoughts.  He is alert and oriented x3.  There were no flight of ideas or any loose association.  His insight judgment  and impulse control is okay.  Established Problem, Stable/Improving (1), Review of Psycho-Social Stressors (1), Review of Last Therapy Session (1) and Review of Medication Regimen & Side Effects (2)  Assessment: Axis I: Bipolar disorder  Axis II: Deferred  Axis III:  Patient Active Problem List   Diagnosis Date Noted  . Hyperlipidemia 06/11/2012    Plan:  Patient is stable on his current medication.  He still has refill remaining on his Ativan.  Discussed benzodiazepine dependence and tolerance.  Patient is scheduled to see his primary care physician in October for blood work and an old physical checkup.  Patient denies any rash or itching.  He denies any other side effects.  I will continue Zyprexa 20 mg daily, Lamictal 300 mg daily Zoloft 100 mg daily and Ativan 0.5 mg as needed. Recommended to call us back if he has any question or any concern.  Follow-up in 3 months.   [name] T., MD 11/06/2014

## 2015-01-03 ENCOUNTER — Encounter: Payer: Self-pay | Admitting: Family Medicine

## 2015-01-03 ENCOUNTER — Ambulatory Visit (INDEPENDENT_AMBULATORY_CARE_PROVIDER_SITE_OTHER): Payer: BLUE CROSS/BLUE SHIELD | Admitting: Family Medicine

## 2015-01-03 VITALS — BP 122/80 | HR 75 | Temp 98.0°F | Resp 16 | Ht 68.0 in | Wt 186.1 lb

## 2015-01-03 DIAGNOSIS — Z1211 Encounter for screening for malignant neoplasm of colon: Secondary | ICD-10-CM

## 2015-01-03 DIAGNOSIS — Z0000 Encounter for general adult medical examination without abnormal findings: Secondary | ICD-10-CM

## 2015-01-03 MED ORDER — ATORVASTATIN CALCIUM 40 MG PO TABS: ORAL_TABLET | ORAL | Status: DC

## 2015-01-03 NOTE — Patient Instructions (Signed)
Follow up in 6 months to recheck cholesterol We'll notify you of your lab results and make any changes if needed Keep up the good work on healthy diet and regular exercise- you look great! We'll call you with your GI appt for the colonoscopy consultation Call with any questions or concerns If you want to join [place] at the new [place] office, any scheduled appointments will automatically transfer and we will see you at [address] [place] [address] [place], [place] [address] Happy [birth year] Birthday!!!

## 2015-01-03 NOTE — Assessment & Plan Note (Signed)
Pt's PE WNL.  Due for colonoscopy now that he is 67.  Declines flu shot.  Check labs.  Anticipatory guidance provided.

## 2015-01-03 NOTE — Progress Notes (Signed)
Pre visit review using our clinic review tool, if applicable. No additional management support is needed unless otherwise documented below in the visit note. 

## 2015-01-03 NOTE — Progress Notes (Signed)
   Subjective:    Patient ID: [place], male    DOB: 02-15-1966, 49 y.o.   MRN: [id number]  HPI CPE- pt is now 49 and needs referral for colonoscopy.  No current concerns.   Review of Systems Patient reports no vision/hearing changes, anorexia, fever ,adenopathy, persistant/recurrent hoarseness, swallowing issues, chest pain, palpitations, edema, persistant/recurrent cough, hemoptysis, dyspnea (rest,exertional, paroxysmal nocturnal), gastrointestinal  bleeding (melena, rectal bleeding), abdominal pain, excessive heart burn, GU symptoms (dysuria, hematuria, voiding/incontinence issues) syncope, focal weakness, memory loss, numbness & tingling, skin/hair/nail changes, depression, anxiety, abnormal bruising/bleeding, musculoskeletal symptoms/signs.     Objective:   Physical Exam BP 122/80 mmHg  Pulse 75  Temp(Src) 98 F (36.7 C) (Oral)  Resp 16  Ht 5\' 8"  (1.727 m)  Wt 186 lb 2 oz (84.426 kg)  BMI 28.31 kg/m2  SpO2 97%  General Appearance:    Alert, cooperative, no distress, appears stated age  Head:    Normocephalic, without obvious abnormality, atraumatic  Eyes:    PERRL, conjunctiva/corneas clear, EOM's intact, fundi    benign, both eyes       Ears:    Normal TM's and external ear canals, both ears  Nose:   Nares normal, septum midline, mucosa normal, no drainage   or sinus tenderness  Throat:   Lips, mucosa, and tongue normal; teeth and gums normal  Neck:   Supple, symmetrical, trachea midline, no adenopathy;       thyroid:  No enlargement/tenderness/nodules  Back:     Symmetric, no curvature, ROM normal, no CVA tenderness  Lungs:     Clear to auscultation bilaterally, respirations unlabored  Chest wall:    No tenderness or deformity  Heart:    Regular rate and rhythm, S1 and S2 normal, no murmur, rub   or gallop  Abdomen:     Soft, non-tender, bowel sounds active all four quadrants,    no masses, no organomegaly  Genitalia:    Normal male without lesion, discharge or  tenderness  Rectal:    Normal tone, normal prostate, no masses or tenderness  Extremities:   Extremities normal, atraumatic, no cyanosis or edema  Pulses:   2+ and symmetric all extremities  Skin:   Skin color, texture, turgor normal, no rashes or lesions  Lymph nodes:   Cervical, supraclavicular, and axillary nodes normal  Neurologic:   CNII-XII intact. Normal strength, sensation and reflexes      throughout          Assessment & Plan:

## 2015-01-04 ENCOUNTER — Encounter: Payer: Self-pay | Admitting: Family Medicine

## 2015-01-04 LAB — HEPATIC FUNCTION PANEL
ALBUMIN: 4.2 g/dL (ref 3.5–5.2)
ALK PHOS: 95 U/L (ref 39–117)
ALT: 25 U/L (ref 0–53)
AST: 16 U/L (ref 0–37)
Bilirubin, Direct: 0.1 mg/dL (ref 0.0–0.3)
TOTAL PROTEIN: 6.7 g/dL (ref 6.0–8.3)
Total Bilirubin: 0.4 mg/dL (ref 0.2–1.2)

## 2015-01-04 LAB — CBC WITH DIFFERENTIAL/PLATELET
BASOS ABS: 0.1 10*3/uL (ref 0.0–0.1)
Basophils Relative: 1 % (ref 0.0–3.0)
EOS ABS: 0.1 10*3/uL (ref 0.0–0.7)
Eosinophils Relative: 1.5 % (ref 0.0–5.0)
HEMATOCRIT: 44.6 % (ref 39.0–52.0)
Hemoglobin: 14.8 g/dL (ref 13.0–17.0)
LYMPHS PCT: 25 % (ref 12.0–46.0)
Lymphs Abs: 2.3 10*3/uL (ref 0.7–4.0)
MCHC: 33.2 g/dL (ref 30.0–36.0)
MCV: 94.3 fl (ref 78.0–100.0)
MONOS PCT: 4.5 % (ref 3.0–12.0)
Monocytes Absolute: 0.4 10*3/uL (ref 0.1–1.0)
Neutro Abs: 6.3 10*3/uL (ref 1.4–7.7)
Neutrophils Relative %: 68 % (ref 43.0–77.0)
PLATELETS: 220 10*3/uL (ref 150.0–400.0)
RBC: 4.73 Mil/uL (ref 4.22–5.81)
RDW: 15 % (ref 11.5–15.5)
WBC: 9.3 10*3/uL (ref 4.0–10.5)

## 2015-01-04 LAB — BASIC METABOLIC PANEL
BUN: 12 mg/dL (ref 6–23)
CALCIUM: 9.6 mg/dL (ref 8.4–10.5)
CO2: 30 mEq/L (ref 19–32)
CREATININE: 1.01 mg/dL (ref 0.40–1.50)
Chloride: 106 mEq/L (ref 96–112)
GFR: 83.09 mL/min (ref >60.00)
Glucose, Bld: 74 mg/dL (ref 70–99)
POTASSIUM: 4.4 meq/L (ref 3.5–5.1)
Sodium: 143 mEq/L (ref 135–145)

## 2015-01-04 LAB — LIPID PANEL
CHOL/HDL RATIO: 6
Cholesterol: 193 mg/dL (ref 0–200)
HDL: 32.6 mg/dL — AB (ref >39.00)
NonHDL: 160.23
Triglycerides: 281 mg/dL — ABNORMAL HIGH (ref 0.0–149.0)
VLDL: 56.2 mg/dL — ABNORMAL HIGH (ref 0.0–40.0)

## 2015-01-04 LAB — TSH: TSH: 1.75 u[IU]/mL (ref 0.35–4.50)

## 2015-01-04 LAB — LDL CHOLESTEROL, DIRECT: Direct LDL: 115 mg/dL

## 2015-01-04 LAB — PSA: PSA: 0.59 ng/mL (ref 0.10–4.00)

## 2015-01-05 MED ORDER — FENOFIBRATE 160 MG PO TABS: 160.0000 mg | ORAL_TABLET | Freq: Every day | ORAL | Status: DC

## 2015-01-29 ENCOUNTER — Telehealth (HOSPITAL_COMMUNITY): Payer: Self-pay

## 2015-01-29 DIAGNOSIS — F3162 Bipolar disorder, current episode mixed, moderate: Secondary | ICD-10-CM

## 2015-01-29 NOTE — Telephone Encounter (Signed)
Medication refill requests - Telephone call with patient who requests a 30 day supply of his Lamictal, Zyprexa and Zoloft.  Pt. requests on 30 day orders to last him until he returns on 02/06/15. All last written for 90 days on 10/17/14.

## 2015-01-30 MED ORDER — OLANZAPINE 20 MG PO TABS: 20.0000 mg | ORAL_TABLET | Freq: Every day | ORAL | Status: DC

## 2015-01-30 MED ORDER — LAMOTRIGINE 150 MG PO TABS: 150.0000 mg | ORAL_TABLET | Freq: Two times a day (BID) | ORAL | Status: DC

## 2015-01-30 MED ORDER — SERTRALINE HCL 100 MG PO TABS
100.0000 mg | ORAL_TABLET | Freq: Every day | ORAL | Status: DC
Start: 2015-01-30 — End: 2015-02-06

## 2015-01-30 NOTE — Telephone Encounter (Signed)
Met with Dr. Adele Schilder who authorized a 30 day refill of patient's prescribed Zoloft, Zyprexa and Lamictal and new orders for each e-scribed into patient's [facility] in [place].  Called patient to inform all 3 orders had been approved for 30 days each and new orders had been sent to his [facility].  Patient to return to see Dr. Adele Schilder on 02/06/15.

## 2015-02-06 ENCOUNTER — Ambulatory Visit (INDEPENDENT_AMBULATORY_CARE_PROVIDER_SITE_OTHER): Payer: BLUE CROSS/BLUE SHIELD | Admitting: Psychiatry

## 2015-02-06 ENCOUNTER — Encounter (HOSPITAL_COMMUNITY): Payer: Self-pay | Admitting: Psychiatry

## 2015-02-06 VITALS — BP 127/78 | HR 83 | Ht 69.0 in | Wt 183.8 lb

## 2015-02-06 DIAGNOSIS — F319 Bipolar disorder, unspecified: Secondary | ICD-10-CM | POA: Diagnosis not present

## 2015-02-06 DIAGNOSIS — F3162 Bipolar disorder, current episode mixed, moderate: Secondary | ICD-10-CM

## 2015-02-06 MED ORDER — LORAZEPAM 0.5 MG PO TABS: 0.5000 mg | ORAL_TABLET | Freq: Every day | ORAL | Status: DC

## 2015-02-06 MED ORDER — SERTRALINE HCL 100 MG PO TABS: 100.0000 mg | ORAL_TABLET | Freq: Every day | ORAL | Status: DC

## 2015-02-06 MED ORDER — LAMOTRIGINE 150 MG PO TABS: 150.0000 mg | ORAL_TABLET | Freq: Two times a day (BID) | ORAL | Status: DC

## 2015-02-06 MED ORDER — OLANZAPINE 20 MG PO TABS: 20.0000 mg | ORAL_TABLET | Freq: Every day | ORAL | Status: DC

## 2015-02-06 NOTE — Progress Notes (Signed)
[facility] Progress Note  Dayvin Aber [id number] 49 y.o.  02/06/2015 10:10 AM  Chief Complaint:  I am under a lot of stress.  I get some time irritable.  There is a lot of stress from work.       History of Present Illness: Aquilla came for his appointment.  He endorse increased anxiety and stress coming from work.  Patient works at [employer] and he endorse and coming holidays he was very busy .  He is working on Thanksgiving and Christmas.  He admitted sometime feeling overwhelmed and gets irritable and frustrated.  However he denies any aggressive behavior.  He is living by himself .  He is working 12-14 hours a day and gets easily tired when he go home.  Recently he seen his primary care physician and found to be high triglycerides .  He is taking medication to lower her triglycerides.  Otherwise his CBC, basic chemistry is normal.  He denies any paranoia or any hallucination.  Denies any suicidal thoughts.  He denies drinking or using any illegal substances.  He is taking his medication Zyprexa, Lamictal, Zoloft and Ativan as needed.  There are times when he has taken Ativan 2 times a day but usually takes 1 Ativan twice a week.  He is hoping once holidays are over his job stress level will get better.  His appetite is okay.  His vitals are stable.  He has no tremors, shakes, EPS, rash or any itching.  He is a scheduled to have colonoscopy very soon.  Patient is planning to visit [facility] to see his children and next June.  Patient lives by himself. .  Suicidal Ideation: No Plan Formed: No Patient has means to carry out plan: No  Homicidal Ideation: No Plan Formed: No Patient has means to carry out plan: No  Review of Systems  Cardiovascular: Negative for chest pain and palpitations.  Gastrointestinal: Negative for heartburn.  Skin: Negative for itching and rash.  Neurological: Negative for dizziness, tingling, tremors and headaches.  Psychiatric/Behavioral: Negative for  suicidal ideas, hallucinations and substance abuse. The patient is nervous/anxious. The patient does not have insomnia.     Psychiatric: Agitation: No Hallucination: No Depressed Mood: No Insomnia: No Hypersomnia: No Altered Concentration: No Feels Worthless: No Grandiose Ideas: No Belief In Special Powers: No New/Increased Substance Abuse: No Compulsions: No  Neurologic: Headache: No Seizure: No Paresthesias: No  Medical History:  Patient has hyperlipidemia.  He sees Dr. Birdie Riddle.  Outpatient Encounter Prescriptions as of 02/06/2015  Medication Sig  . atorvastatin (LIPITOR) 40 MG tablet TAKE ONE TABLET BY MOUTH AT BEDTIME  . fenofibrate 160 MG tablet Take 1 tablet (160 mg total) by mouth daily.  Marland Kitchen lamoTRIgine (LAMICTAL) 150 MG tablet Take 1 tablet (150 mg total) by mouth 2 (two) times daily.  Marland Kitchen LORazepam (ATIVAN) 0.5 MG tablet Take 1 tablet (0.5 mg total) by mouth at bedtime.  . Multiple Vitamin (MULTIVITAMIN) tablet Take 1 tablet by mouth daily.  Marland Kitchen OLANZapine (ZYPREXA) 20 MG tablet Take 1 tablet (20 mg total) by mouth at bedtime.  . sertraline (ZOLOFT) 100 MG tablet Take 1 tablet (100 mg total) by mouth daily.  . [DISCONTINUED] lamoTRIgine (LAMICTAL) 150 MG tablet Take 1 tablet (150 mg total) by mouth 2 (two) times daily.  . [DISCONTINUED] LORazepam (ATIVAN) 0.5 MG tablet Take 1 tablet (0.5 mg total) by mouth at bedtime.  . [DISCONTINUED] OLANZapine (ZYPREXA) 20 MG tablet Take 1 tablet (20 mg total) by  mouth at bedtime.  . [DISCONTINUED] sertraline (ZOLOFT) 100 MG tablet Take 1 tablet (100 mg total) by mouth daily.   No facility-administered encounter medications on file as of 02/06/2015.    Past Psychiatric History/Hospitalization(s): Patient had tried Abilify and Geodon in the past with limited improvement.  He has a history of impulsive behavior. Anxiety: Yes Bipolar Disorder: Yes Depression: Yes Mania: Yes Psychosis: No Schizophrenia: No Personality Disorder:  No Hospitalization for psychiatric illness: No History of Electroconvulsive Shock Therapy: No Prior Suicide Attempts: No  Physical Exam: Constitutional:  BP 127/78 mmHg  Pulse 83  Ht 5\' 9"  (1.753 m)  Wt 183 lb 12.8 oz (83.371 kg)  BMI 27.13 kg/m2    Recent Results (from the past 2160 hour(s))  Lipid panel     Status: Abnormal   Collection Time: 01/03/15  1:56 PM  Result Value Ref Range   Cholesterol 193 0 - 200 mg/dL    Comment: ATP III Classification       Desirable:  < 200 mg/dL               Borderline High:  200 - 239 mg/dL          High:  > = 240 mg/dL   Triglycerides 281.0 (H) 0.0 - 149.0 mg/dL    Comment: Normal:  <150 mg/dLBorderline High:  150 - 199 mg/dL   HDL 32.60 (L) >39.00 mg/dL   VLDL 56.2 (H) 0.0 - 40.0 mg/dL   Total CHOL/HDL Ratio 6     Comment:                Men          Women1/2 Average Risk     3.4          3.3Average Risk          5.0          4.42X Average Risk          9.6          7.13X Average Risk          15.0          11.0                       NonHDL 160.23     Comment: NOTE:  Non-HDL goal should be 30 mg/dL higher than patient's LDL goal (i.e. LDL goal of < 70 mg/dL, would have non-HDL goal of < 100 mg/dL)  Basic metabolic panel     Status: None   Collection Time: 01/03/15  1:56 PM  Result Value Ref Range   Sodium 143 135 - 145 mEq/L   Potassium 4.4 3.5 - 5.1 mEq/L   Chloride 106 96 - 112 mEq/L   CO2 30 19 - 32 mEq/L   Glucose, Bld 74 70 - 99 mg/dL   BUN 12 6 - 23 mg/dL   Creatinine, Ser 1.01 0.40 - 1.50 mg/dL   Calcium 9.6 8.4 - 10.5 mg/dL   GFR 83.09 >60.00 mL/min  TSH     Status: None   Collection Time: 01/03/15  1:56 PM  Result Value Ref Range   TSH 1.75 0.35 - 4.50 uIU/mL  Hepatic function panel     Status: None   Collection Time: 01/03/15  1:56 PM  Result Value Ref Range   Total Bilirubin 0.4 0.2 - 1.2 mg/dL   Bilirubin, Direct 0.1 0.0 - 0.3 mg/dL   Alkaline Phosphatase 95 39 - 117  U/L   AST 16 0 - 37 U/L   ALT 25 0 - 53 U/L    Total Protein 6.7 6.0 - 8.3 g/dL   Albumin 4.2 3.5 - 5.2 g/dL  CBC with Differential/Platelet     Status: None   Collection Time: 01/03/15  1:56 PM  Result Value Ref Range   WBC 9.3 4.0 - 10.5 K/uL   RBC 4.73 4.22 - 5.81 Mil/uL   Hemoglobin 14.8 13.0 - 17.0 g/dL   HCT 44.6 39.0 - 52.0 %   MCV 94.3 78.0 - 100.0 fl   MCHC 33.2 30.0 - 36.0 g/dL   RDW 15.0 11.5 - 15.5 %   Platelets 220.0 150.0 - 400.0 K/uL   Neutrophils Relative % 68.0 43.0 - 77.0 %   Lymphocytes Relative 25.0 12.0 - 46.0 %   Monocytes Relative 4.5 3.0 - 12.0 %   Eosinophils Relative 1.5 0.0 - 5.0 %   Basophils Relative 1.0 0.0 - 3.0 %   Neutro Abs 6.3 1.4 - 7.7 K/uL   Lymphs Abs 2.3 0.7 - 4.0 K/uL   Monocytes Absolute 0.4 0.1 - 1.0 K/uL   Eosinophils Absolute 0.1 0.0 - 0.7 K/uL   Basophils Absolute 0.1 0.0 - 0.1 K/uL  PSA     Status: None   Collection Time: 01/03/15  1:56 PM  Result Value Ref Range   PSA 0.59 0.10 - 4.00 ng/mL  LDL cholesterol, direct     Status: None   Collection Time: 01/03/15  1:56 PM  Result Value Ref Range   Direct LDL 115.0 mg/dL    Comment: Optimal:  <100 mg/dLNear or Above Optimal:  100-129 mg/dLBorderline High:  130-159 mg/dLHigh:  160-189 mg/dLVery High:  >190 mg/dL    General Appearance: well nourished, casually dressed and fairly groomed.  Musculoskeletal: Strength & Muscle Tone: within normal limits Gait & Station: normal Patient leans: N/A  Mental Status Examination Patient is well dressed and groomed.  He maintained good eye contact.  He is cooperative .  He endorse his mood tired and his affect is appropriate.  His speech is clear, spontaneous, coherent with normal tone and volume.  His thought process logical and goal-directed.  His attention and concentration is good.  There were no tremors or shakes.  His psychomotor activity is within normal limits.  His fund of knowledge is adequate.  He denies any auditory or visual hallucination.  He denies any paranoia, delusions or any  obsessive thoughts.  He is alert and oriented x3.  There were no flight of ideas or any loose association.  His insight judgment and impulse control is okay.  Established Problem, Stable/Improving (1), New problem, with additional work up planned, Review of Psycho-Social Stressors (1), Review or order clinical lab tests (1), Review of Last Therapy Session (1) and Review of Medication Regimen & Side Effects (2)  Assessment: Axis I: Bipolar disorder  Axis II: Deferred  Axis III:  Patient Active Problem List   Diagnosis Date Noted  . Hyperlipidemia 06/11/2012    Plan:  I review patient's blood work, psychosocial stressors and his current medication.  Patient is not interested to increase his medication however I offered counseling and patient accepted to see a counselor in this office.  I will schedule appointment with Tharon Aquas in this office.  His CBC and chemistry is normal.  His triglyceride is increased.  He is taking medication for that.  Discuss safety plan that anytime having active suicidal thoughts or homicidal thoughts and he need  to call 911 or go to local emergency room.  He has no concerns with his current psychiatric medication.  I will continue  Zyprexa 20 mg daily, Lamictal 300 mg daily Zoloft 100 mg daily and Ativan 0.5 mg as needed. Recommended to call us back if he has any question or any concern.  Follow-up in 3 months.   Minervia Osso T., MD 02/06/2015

## 2015-03-01 ENCOUNTER — Ambulatory Visit (HOSPITAL_COMMUNITY): Payer: Self-pay | Admitting: Clinical

## 2015-03-02 ENCOUNTER — Encounter: Payer: Self-pay | Admitting: Internal Medicine

## 2015-04-09 ENCOUNTER — Ambulatory Visit (HOSPITAL_COMMUNITY): Payer: Self-pay | Admitting: Clinical

## 2015-04-10 ENCOUNTER — Ambulatory Visit (AMBULATORY_SURGERY_CENTER): Payer: Self-pay

## 2015-04-10 VITALS — Ht 69.0 in | Wt 183.2 lb

## 2015-04-10 DIAGNOSIS — Z1211 Encounter for screening for malignant neoplasm of colon: Secondary | ICD-10-CM

## 2015-04-10 MED ORDER — NA SULFATE-K SULFATE-MG SULF 17.5-3.13-1.6 GM/177ML PO SOLN: ORAL | Status: DC

## 2015-04-10 NOTE — Progress Notes (Signed)
Per pt, no allergies to soy or egg products.Pt not taking any weight loss meds or using  O2 at home. 

## 2015-04-23 ENCOUNTER — Encounter: Payer: Self-pay | Admitting: Internal Medicine

## 2015-04-23 ENCOUNTER — Ambulatory Visit (AMBULATORY_SURGERY_CENTER): Payer: BLUE CROSS/BLUE SHIELD | Admitting: Internal Medicine

## 2015-04-23 VITALS — BP 110/64 | HR 72 | Temp 97.7°F | Resp 14 | Ht 69.0 in | Wt 183.0 lb

## 2015-04-23 DIAGNOSIS — D123 Benign neoplasm of transverse colon: Secondary | ICD-10-CM | POA: Diagnosis not present

## 2015-04-23 DIAGNOSIS — D125 Benign neoplasm of sigmoid colon: Secondary | ICD-10-CM

## 2015-04-23 DIAGNOSIS — Z1211 Encounter for screening for malignant neoplasm of colon: Secondary | ICD-10-CM

## 2015-04-23 DIAGNOSIS — D122 Benign neoplasm of ascending colon: Secondary | ICD-10-CM

## 2015-04-23 DIAGNOSIS — D127 Benign neoplasm of rectosigmoid junction: Secondary | ICD-10-CM | POA: Diagnosis not present

## 2015-04-23 MED ORDER — SODIUM CHLORIDE 0.9 % IV SOLN: 500.0000 mL | INTRAVENOUS | Status: DC

## 2015-04-23 NOTE — Progress Notes (Signed)
Report to PACU, RN, vss, BBS= Clear.  

## 2015-04-23 NOTE — Op Note (Signed)
[facility]  [address]. [place], [address]   COLONOSCOPY PROCEDURE REPORT  PATIENT: Timothy Burnett, Timothy Burnett  MR#: [id number] BIRTHDATE: 1965/12/11 , 49  yrs. old GENDER: male ENDOSCOPIST: Jerene Bears, MD REFERRED [id number] Wadie Lessen, M.D. PROCEDURE DATE:  04/23/2015 PROCEDURE:   Colonoscopy, screening, Colonoscopy with snare polypectomy, and Colonoscopy with cold biopsy polypectomy First Screening Colonoscopy - Avg.  risk and is 50 yrs.  old or older Yes.  Prior Negative Screening - Now for repeat screening. N/A  History of Adenoma - Now for follow-up colonoscopy & has been > or = to 3 yrs.  N/A  Polyps removed today? Yes ASA CLASS:   Class II INDICATIONS:Screening for colonic neoplasia and Colorectal Neoplasm Risk Assessment for this procedure is average risk. MEDICATIONS: Monitored anesthesia care and Propofol 300 mg IV  DESCRIPTION OF PROCEDURE:   After the risks benefits and alternatives of the procedure were thoroughly explained, informed consent was obtained.  The digital rectal exam revealed no rectal mass.   The LB [id number]  endoscope was introduced through the anus and advanced to the cecum, which was identified by both the appendix and ileocecal valve. No adverse events experienced. The quality of the prep was fair clearing to good after copious irrigation and lavage.  (Suprep was used)  The instrument was then slowly withdrawn as the colon was fully examined. Estimated blood loss is zero unless otherwise noted in this procedure report.  COLON FINDINGS: A sessile polyp measuring 3 mm in size was found in the ascending colon.  A polypectomy was performed with cold forceps.  The resection was complete, the polyp tissue was completely retrieved and sent to histology.   Two sessile polyps ranging from 4 to 61mm in size were found in the proximal transverse colon and sigmoid colon.  Polypectomies were performed with a cold snare.  The resection was  complete, the polyp tissue was completely retrieved and sent to histology.  Retroflexed views revealed internal hemorrhoids. The time to cecum = 2.9 Withdrawal time = 16.2   The scope was withdrawn and the procedure completed. COMPLICATIONS: There were no immediate complications.  ENDOSCOPIC IMPRESSION: 1.   Sessile polyp was found in the ascending colon; polypectomy was performed with cold forceps 2.   Two sessile polyps ranging from 4 to 91mm in size were found in the proximal transverse colon and sigmoid colon; polypectomies were performed with a cold snare  RECOMMENDATIONS: 1.  Await pathology results 2.  Timing of repeat colonoscopy will be determined by pathology findings. 3.  You will receive a letter within 1-2 weeks with the results of your biopsy as well as final recommendations.  Please call my office if you have not received a letter after 3 weeks.  eSigned:  Jerene Bears, MD 04/23/2015 3:01 PM  cc:  the patient, Dr. Birdie Riddle

## 2015-04-23 NOTE — Patient Instructions (Signed)
YOU HAD AN ENDOSCOPIC PROCEDURE TODAY AT [facility]:   Refer to the procedure report that was given to you for any specific questions about what was found during the examination.  If the procedure report does not answer your questions, please call your gastroenterologist to clarify.  If you requested that your care partner not be given the details of your procedure findings, then the procedure report has been included in a sealed envelope for you to review at your convenience later.  YOU SHOULD EXPECT: Some feelings of bloating in the abdomen. Passage of more gas than usual.  Walking can help get rid of the air that was put into your GI tract during the procedure and reduce the bloating. If you had a lower endoscopy (such as a colonoscopy or flexible sigmoidoscopy) you may notice spotting of blood in your stool or on the toilet paper. If you underwent a bowel prep for your procedure, you may not have a normal bowel movement for a few days.  Please Note:  You might notice some irritation and congestion in your nose or some drainage.  This is from the oxygen used during your procedure.  There is no need for concern and it should clear up in a day or so.  SYMPTOMS TO REPORT IMMEDIATELY:   Following lower endoscopy (colonoscopy or flexible sigmoidoscopy):  Excessive amounts of blood in the stool  Significant tenderness or worsening of abdominal pains  Swelling of the abdomen that is new, acute  Fever of 100F or higher   For urgent or emergent issues, a gastroenterologist can be reached at any hour by calling [phone].   DIET: Your first meal following the procedure should be a small meal and then it is ok to progress to your normal diet. Heavy or fried foods are harder to digest and may make you feel nauseous or bloated.  Likewise, meals heavy in dairy and vegetables can increase bloating.  Drink plenty of fluids but you should avoid alcoholic beverages for 24  hours.  ACTIVITY:  You should plan to take it easy for the rest of today and you should NOT DRIVE or use heavy machinery until tomorrow (because of the sedation medicines used during the test).    FOLLOW UP: Our staff will call the number listed on your records the next business day following your procedure to check on you and address any questions or concerns that you may have regarding the information given to you following your procedure. If we do not reach you, we will leave a message.  However, if you are feeling well and you are not experiencing any problems, there is no need to return our call.  We will assume that you have returned to your regular daily activities without incident.  If any biopsies were taken you will be contacted by phone or by letter within the next 1-3 weeks.  Please call us at [phone] if you have not heard about the biopsies in 3 weeks.    SIGNATURES/CONFIDENTIALITY: You and/or your care partner have signed paperwork which will be entered into your electronic medical record.  These signatures attest to the fact that that the information above on your After Visit Summary has been reviewed and is understood.  Full responsibility of the confidentiality of this discharge information lies with you and/or your care-partner.  Polyp handout given Await pathology results

## 2015-04-23 NOTE — Progress Notes (Signed)
Called to room to assist during endoscopic procedure.  Patient ID and intended procedure confirmed with present staff. Received instructions for my participation in the procedure from the performing physician.  

## 2015-04-24 ENCOUNTER — Telehealth: Payer: Self-pay | Admitting: *Deleted

## 2015-04-24 NOTE — Telephone Encounter (Signed)
Left message on f/u call 

## 2015-04-26 ENCOUNTER — Encounter: Payer: Self-pay | Admitting: Internal Medicine

## 2015-05-09 ENCOUNTER — Encounter (HOSPITAL_COMMUNITY): Payer: Self-pay | Admitting: Psychiatry

## 2015-05-09 ENCOUNTER — Ambulatory Visit (INDEPENDENT_AMBULATORY_CARE_PROVIDER_SITE_OTHER): Payer: BLUE CROSS/BLUE SHIELD | Admitting: Psychiatry

## 2015-05-09 VITALS — BP 110/72 | HR 81 | Ht 68.0 in | Wt 178.4 lb

## 2015-05-09 DIAGNOSIS — F3162 Bipolar disorder, current episode mixed, moderate: Secondary | ICD-10-CM

## 2015-05-09 DIAGNOSIS — F319 Bipolar disorder, unspecified: Secondary | ICD-10-CM

## 2015-05-09 MED ORDER — SERTRALINE HCL 100 MG PO TABS: 100.0000 mg | ORAL_TABLET | Freq: Every day | ORAL | Status: DC

## 2015-05-09 MED ORDER — LAMOTRIGINE 150 MG PO TABS: 150.0000 mg | ORAL_TABLET | Freq: Two times a day (BID) | ORAL | Status: DC

## 2015-05-09 MED ORDER — LORAZEPAM 0.5 MG PO TABS: 0.5000 mg | ORAL_TABLET | Freq: Every day | ORAL | Status: DC

## 2015-05-09 MED ORDER — OLANZAPINE 20 MG PO TABS: 20.0000 mg | ORAL_TABLET | Freq: Every day | ORAL | Status: DC

## 2015-05-09 NOTE — Progress Notes (Signed)
[facility] [phone] Progress Note  Timothy Burnett [id number] 50 y.o.  05/09/2015 12:48 PM  Chief Complaint:  I'm doing better.  My medicine is working well.         History of Present Illness: Timothy Burnett came for his appointment.  He is taking his medication as prescribed but denies any side effects.  Last time we have given Ativan to take as needed and he is still have a few tablets remaining.  After Christmas his job is less stressful.  He admitted less irritability and agitation.  He sleeping good.  He lost 7 pounds in past 3 months because he is watching his diet and started regular exercise.  He reported no side effects of medication.  He has no rash or itching.  He denies any irritability, crying spells, anger issues or any feeling of hopelessness or worthlessness.  He is hoping to visit [place] in June to see her kids.  His daughter wants to join [employer] and is concerned about the finances.  Patient denies drinking or using any illegal substances.  He lives alone.  He is working at [employer].  Suicidal Ideation: No Plan Formed: No Patient has means to carry out plan: No  Homicidal Ideation: No Plan Formed: No Patient has means to carry out plan: No  Review of Systems  Cardiovascular: Negative for chest pain and palpitations.  Gastrointestinal: Negative for heartburn.  Skin: Negative for itching and rash.  Neurological: Negative for dizziness, tingling, tremors and headaches.  Psychiatric/Behavioral: Negative for suicidal ideas, hallucinations and substance abuse. The patient does not have insomnia.     Psychiatric: Agitation: No Hallucination: No Depressed Mood: No Insomnia: No Hypersomnia: No Altered Concentration: No Feels Worthless: No Grandiose Ideas: No Belief In Special Powers: No New/Increased Substance Abuse: No Compulsions: No  Neurologic: Headache: No Seizure: No Paresthesias: No  Medical History:  Patient has hyperlipidemia.  He sees Dr.  Birdie Riddle.  Outpatient Encounter Prescriptions as of 05/09/2015  Medication Sig  . atorvastatin (LIPITOR) 40 MG tablet TAKE ONE TABLET BY MOUTH AT BEDTIME  . fenofibrate 160 MG tablet Take 1 tablet (160 mg total) by mouth daily.  Marland Kitchen lamoTRIgine (LAMICTAL) 150 MG tablet Take 1 tablet (150 mg total) by mouth 2 (two) times daily.  Marland Kitchen LORazepam (ATIVAN) 0.5 MG tablet Take 1 tablet (0.5 mg total) by mouth at bedtime.  . Multiple Vitamin (MULTIVITAMIN) tablet Take 1 tablet by mouth daily.  Marland Kitchen OLANZapine (ZYPREXA) 20 MG tablet Take 1 tablet (20 mg total) by mouth at bedtime.  . sertraline (ZOLOFT) 100 MG tablet Take 1 tablet (100 mg total) by mouth daily.  . [DISCONTINUED] lamoTRIgine (LAMICTAL) 150 MG tablet Take 1 tablet (150 mg total) by mouth 2 (two) times daily.  . [DISCONTINUED] LORazepam (ATIVAN) 0.5 MG tablet Take 1 tablet (0.5 mg total) by mouth at bedtime.  . [DISCONTINUED] OLANZapine (ZYPREXA) 20 MG tablet Take 1 tablet (20 mg total) by mouth at bedtime.  . [DISCONTINUED] sertraline (ZOLOFT) 100 MG tablet Take 1 tablet (100 mg total) by mouth daily.   No facility-administered encounter medications on file as of 05/09/2015.    Past Psychiatric History/Hospitalization(s): Patient had tried Abilify and Geodon in the past with limited improvement.  He has a history of impulsive behavior. Anxiety: Yes Bipolar Disorder: Yes Depression: Yes Mania: Yes Psychosis: No Schizophrenia: No Personality Disorder: No Hospitalization for psychiatric illness: No History of Electroconvulsive Shock Therapy: No Prior Suicide Attempts: No  Physical Exam: Constitutional:  BP 110/72 mmHg  Pulse 81  Ht 5\' 8"  (1.727 m)  Wt 178 lb 6.4 oz (80.922 kg)  BMI 27.13 kg/m2    No results found for this or any previous visit (from the past 2160 hour(s)).  General Appearance: well nourished, casually dressed and fairly groomed.  Musculoskeletal: Strength & Muscle Tone: within normal limits Gait & Station:  normal Patient leans: N/A  Mental Status Examination Patient is well dressed and groomed.  He maintained good eye contact.  He is cooperative .   described his mood euthymic and his affect is appropriate. His speech is clear, spontaneous, coherent with normal tone and volume.  His thought process logical and goal-directed.  His attention and concentration is good.  There were no tremors or shakes.  His psychomotor activity is within normal limits.  His fund of knowledge is adequate.  He denies any auditory or visual hallucination.  He denies any paranoia, delusions or any obsessive thoughts.  He is alert and oriented x3.  There were no flight of ideas or any loose association.  His insight judgment and impulse control is okay.  Established Problem, Stable/Improving (1), Review of Psycho-Social Stressors (1), Review of Last Therapy Session (1) and Review of Medication Regimen & Side Effects (2)  Assessment: Axis I: Bipolar disorder  Axis II: Deferred  Axis III:  Patient Active Problem List   Diagnosis Date Noted  . Hyperlipidemia 06/11/2012    Plan:  Patient is doing better on his current psychiatric medication.  He takes Ativan only as needed for severe anxiety.  He has lost his weight from the past and watching his diet and doing her exercise.  I will continue Zyprexa 20 mg daily, Lamictal 300 mg daily, Zoloft 100 mg daily and Ativan 0.5 mg as needed for severe anxiety.  Discussed medication side effects and benefits.  Recommended to call us back if he has any question or any concern.  Follow-up in 3 months. Discuss safety plan that anytime having active suicidal thoughts or homicidal thoughts and he need to call 911 or go to local emergency room.  Sherria Riemann T., MD 05/09/2015

## 2015-07-04 ENCOUNTER — Ambulatory Visit (INDEPENDENT_AMBULATORY_CARE_PROVIDER_SITE_OTHER): Payer: BLUE CROSS/BLUE SHIELD | Admitting: Family Medicine

## 2015-07-04 ENCOUNTER — Encounter: Payer: Self-pay | Admitting: Family Medicine

## 2015-07-04 VITALS — BP 110/73 | HR 76 | Temp 98.3°F | Resp 16 | Ht 68.0 in | Wt 175.4 lb

## 2015-07-04 DIAGNOSIS — E785 Hyperlipidemia, unspecified: Secondary | ICD-10-CM | POA: Diagnosis not present

## 2015-07-04 LAB — LIPID PANEL
Cholesterol: 169 mg/dL (ref 0–200)
HDL: 33.5 mg/dL — ABNORMAL LOW (ref >39.00)
LDL Cholesterol: 110 mg/dL — ABNORMAL HIGH (ref 0–99)
NONHDL: 135.51
Total CHOL/HDL Ratio: 5
Triglycerides: 130 mg/dL (ref 0.0–149.0)
VLDL: 26 mg/dL (ref 0.0–40.0)

## 2015-07-04 LAB — BASIC METABOLIC PANEL
BUN: 13 mg/dL (ref 6–23)
CO2: 29 mEq/L (ref 19–32)
Calcium: 9.8 mg/dL (ref 8.4–10.5)
Chloride: 107 mEq/L (ref 96–112)
Creatinine, Ser: 1.13 mg/dL (ref 0.40–1.50)
GFR: 72.85 mL/min (ref >60.00)
GLUCOSE: 87 mg/dL (ref 70–99)
POTASSIUM: 4.2 meq/L (ref 3.5–5.1)
Sodium: 141 mEq/L (ref 135–145)

## 2015-07-04 LAB — HEPATIC FUNCTION PANEL
ALBUMIN: 4.4 g/dL (ref 3.5–5.2)
ALT: 21 U/L (ref 0–53)
AST: 19 U/L (ref 0–37)
Alkaline Phosphatase: 47 U/L (ref 39–117)
Bilirubin, Direct: 0.1 mg/dL (ref 0.0–0.3)
TOTAL PROTEIN: 7 g/dL (ref 6.0–8.3)
Total Bilirubin: 0.3 mg/dL (ref 0.2–1.2)

## 2015-07-04 MED ORDER — FENOFIBRATE 160 MG PO TABS: 160.0000 mg | ORAL_TABLET | Freq: Every day | ORAL | Status: DC

## 2015-07-04 MED ORDER — ATORVASTATIN CALCIUM 40 MG PO TABS: ORAL_TABLET | ORAL | Status: DC

## 2015-07-04 NOTE — Progress Notes (Signed)
   Subjective:    Patient ID: Timothy Burnett, male    DOB: 08/02/1965, 50 y.o.   MRN: [id number]  HPI Hyperlipidemia- chronic problem, on Atorvastatin, Fenofibrate.  Pt reports eating better and exercising regularly.  Has lost ~14 lbs in total!  No CP, SOB, HAs, visual changes, abd pain, N/V, edema.   Review of Systems For ROS see HPI     Objective:   Physical Exam  Constitutional: He is oriented to person, place, and time. He appears well-developed and well-nourished. No distress.  HENT:  Head: Normocephalic and atraumatic.  Eyes: Conjunctivae and EOM are normal. Pupils are equal, round, and reactive to light.  Neck: Normal range of motion. Neck supple. No thyromegaly present.  Cardiovascular: Normal rate, regular rhythm, normal heart sounds and intact distal pulses.   No murmur heard. Pulmonary/Chest: Effort normal and breath sounds normal. No respiratory distress.  Abdominal: Soft. Bowel sounds are normal. He exhibits no distension.  Musculoskeletal: He exhibits no edema.  Lymphadenopathy:    He has no cervical adenopathy.  Neurological: He is alert and oriented to person, place, and time. No cranial nerve deficit.  Skin: Skin is warm and dry.  Psychiatric: He has a normal mood and affect. His behavior is normal.  Vitals reviewed.         Assessment & Plan:

## 2015-07-04 NOTE — Assessment & Plan Note (Signed)
Chronic problem.  Pt is tolerating medications w/o difficulty.  Applauded his efforts at healthy diet and regular exercise.  Check labs.  Adjust meds prn

## 2015-07-04 NOTE — Patient Instructions (Addendum)
Schedule your complete physical in 6 months We'll notify you of your lab results and make any changes if needed Keep up the good work on healthy diet and regular exercise- you look great!!! Call with any questions or concerns Thanks for sticking with [place]! Happy Spring!!!

## 2015-07-04 NOTE — Progress Notes (Signed)
Pre visit review using our clinic review tool, if applicable. No additional management support is needed unless otherwise documented below in the visit note. 

## 2015-08-04 ENCOUNTER — Other Ambulatory Visit (HOSPITAL_COMMUNITY): Payer: Self-pay | Admitting: Psychiatry

## 2015-08-13 ENCOUNTER — Ambulatory Visit (HOSPITAL_COMMUNITY): Payer: Self-pay | Admitting: Psychiatry

## 2015-08-22 ENCOUNTER — Encounter (HOSPITAL_COMMUNITY): Payer: Self-pay | Admitting: Psychiatry

## 2015-08-22 ENCOUNTER — Ambulatory Visit (INDEPENDENT_AMBULATORY_CARE_PROVIDER_SITE_OTHER): Payer: BLUE CROSS/BLUE SHIELD | Admitting: Psychiatry

## 2015-08-22 VITALS — BP 122/64 | HR 90 | Ht 68.5 in | Wt 177.0 lb

## 2015-08-22 DIAGNOSIS — F319 Bipolar disorder, unspecified: Secondary | ICD-10-CM | POA: Diagnosis not present

## 2015-08-22 MED ORDER — LORAZEPAM 0.5 MG PO TABS: 0.5000 mg | ORAL_TABLET | Freq: Every day | ORAL | Status: DC

## 2015-08-22 NOTE — Progress Notes (Signed)
[facility] Progress Note  Timothy Burnett [id number] 50 y.o.  08/22/2015 10:46 AM  Chief Complaint:  I am disappointed because my daughter does not want me to come [place] for graduation.           History of Present Illness: Emidio came for his appointment.   He is somewhat disappointed because his daughter who lives in [place] told him that he does not need to come [place] for his graduation.  Patient told she is upset because I have not visited her in past 2 years and she does not feel that he should come to her graduation.  Patient was disappointed and sad but now he is handling the news better.  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  He is pleased everything went well.  He is not taking Ativan as frequently.  He sleeping good.  He denies any irritability, anger, mood swing.  His social active and denies any feeling of hopelessness or worthlessness.  He denies any paranoia or any hallucination.  He wants to continue his Zyprexa, Zoloft, Lamictal and Ativan as needed.  He has no tremors, shakes, rash or any itching.  His appetite is okay.  His vitals are stable.  He has decided not to go [place] and he like to spend time somebody else.  Patient denies drinking alcohol or using any illegal substances.  He recently seen his primary care physician and there were no changes. He lives alone.  He is working at [employer].  Suicidal Ideation: No Plan Formed: No Patient has means to carry out plan: No  Homicidal Ideation: No Plan Formed: No Patient has means to carry out plan: No  Review of Systems  Cardiovascular: Negative for chest pain and palpitations.  Gastrointestinal: Negative for heartburn.  Skin: Negative for itching and rash.  Neurological: Negative for dizziness, tingling, tremors and headaches.  Psychiatric/Behavioral: Negative for suicidal ideas, hallucinations and substance abuse. The patient does not have insomnia.      Psychiatric: Agitation: No Hallucination: No Depressed Mood: No Insomnia: No Hypersomnia: No Altered Concentration: No Feels Worthless: No Grandiose Ideas: No Belief In Special Powers: No New/Increased Substance Abuse: No Compulsions: No  Neurologic: Headache: No Seizure: No Paresthesias: No  Medical History:  Patient has hyperlipidemia.  He sees Dr. Birdie Riddle.  Outpatient Encounter Prescriptions as of 08/22/2015  Medication Sig  . atorvastatin (LIPITOR) 40 MG tablet TAKE ONE TABLET BY MOUTH AT BEDTIME  . fenofibrate 160 MG tablet Take 1 tablet (160 mg total) by mouth daily.  Marland Kitchen lamoTRIgine (LAMICTAL) 150 MG tablet TAKE ONE TABLET BY MOUTH TWICE DAILY  . LORazepam (ATIVAN) 0.5 MG tablet Take 1 tablet (0.5 mg total) by mouth at bedtime.  . Multiple Vitamin (MULTIVITAMIN) tablet Take 1 tablet by mouth daily.  Marland Kitchen OLANZapine (ZYPREXA) 20 MG tablet TAKE ONE TABLET BY MOUTH AT BEDTIME  . sertraline (ZOLOFT) 100 MG tablet TAKE ONE TABLET BY MOUTH ONCE DAILY  . [DISCONTINUED] LORazepam (ATIVAN) 0.5 MG tablet Take 1 tablet (0.5 mg total) by mouth at bedtime.   No facility-administered encounter medications on file as of 08/22/2015.    Past Psychiatric History/Hospitalization(s): Patient had tried Abilify and Geodon in the past with limited improvement.  He has a history of impulsive behavior. Anxiety: Yes Bipolar Disorder: Yes Depression: Yes Mania: Yes Psychosis: No Schizophrenia: No Personality Disorder: No Hospitalization for psychiatric illness: No History of Electroconvulsive Shock Therapy: No Prior Suicide Attempts: No  Physical Exam: Constitutional:  BP 122/64 mmHg  Pulse 90  Ht 5' 8.5" (1.74 m)  Wt 177 lb (80.287 kg)  BMI 26.52 kg/m2    Recent Results (from the past 2160 hour(s))  Basic metabolic panel     Status: None   Collection Time: 07/04/15 10:07 AM  Result Value Ref Range   Sodium 141 135 - 145 mEq/L   Potassium 4.2 3.5 - 5.1 mEq/L   Chloride 107 96  - 112 mEq/L   CO2 29 19 - 32 mEq/L   Glucose, Bld 87 70 - 99 mg/dL   BUN 13 6 - 23 mg/dL   Creatinine, Ser 1.13 0.40 - 1.50 mg/dL   Calcium 9.8 8.4 - 10.5 mg/dL   GFR 72.85 >60.00 mL/min  Lipid panel     Status: Abnormal   Collection Time: 07/04/15 10:07 AM  Result Value Ref Range   Cholesterol 169 0 - 200 mg/dL    Comment: ATP III Classification       Desirable:  < 200 mg/dL               Borderline High:  200 - 239 mg/dL          High:  > = 240 mg/dL   Triglycerides 130.0 0.0 - 149.0 mg/dL    Comment: Normal:  <150 mg/dLBorderline High:  150 - 199 mg/dL   HDL 33.50 (L) >39.00 mg/dL   VLDL 26.0 0.0 - 40.0 mg/dL   LDL Cholesterol 110 (H) 0 - 99 mg/dL   Total CHOL/HDL Ratio 5     Comment:                Men          Women1/2 Average Risk     3.4          3.3Average Risk          5.0          4.42X Average Risk          9.6          7.13X Average Risk          15.0          11.0                       NonHDL 135.51     Comment: NOTE:  Non-HDL goal should be 30 mg/dL higher than patient's LDL goal (i.e. LDL goal of < 70 mg/dL, would have non-HDL goal of < 100 mg/dL)  Hepatic function panel     Status: None   Collection Time: 07/04/15 10:07 AM  Result Value Ref Range   Total Bilirubin 0.3 0.2 - 1.2 mg/dL   Bilirubin, Direct 0.1 0.0 - 0.3 mg/dL   Alkaline Phosphatase 47 39 - 117 U/L   AST 19 0 - 37 U/L   ALT 21 0 - 53 U/L   Total Protein 7.0 6.0 - 8.3 g/dL   Albumin 4.4 3.5 - 5.2 g/dL    General Appearance: well nourished, casually dressed and fairly groomed.  Musculoskeletal: Strength & Muscle Tone: within normal limits Gait & Station: normal Patient leans: N/A  Mental Status Examination Patient is Casually dressed and groomed.  He maintained good eye contact.   He is cooperative and described his mood euthymic and his affect is appropriate. His speech is clear, spontaneous, coherent with normal tone and volume.  His thought process logical and goal-directed.  His attention and  concentration is good.  There were no tremors or shakes.  His psychomotor activity is within normal limits.  His fund of knowledge is adequate.  He denies any auditory or visual hallucination.  He denies any paranoia, delusions or any obsessive thoughts.  He is alert and oriented x3.  There were no flight of ideas or any loose association.  His insight judgment and impulse control is okay.  Established Problem, Stable/Improving (1), Review of Psycho-Social Stressors (1), Review or order clinical lab tests (1), Review of Last Therapy Session (1) and Review of Medication Regimen & Side Effects (2)  Assessment: Axis I: Bipolar disorder  Axis II: Deferred  Axis III:  Patient Active Problem List   Diagnosis Date Noted  . Hyperlipidemia 06/11/2012    Plan:  Patient is Stable on his current psychiatric medication.  Discusses psychosocial issues and reassurance given.  Patient reported no side effects from his medication.  I will continue Zyprexa 20 mg daily, Lamictal 300 mg daily, Zoloft 100 mg daily and Ativan 0.5 mg as needed for severe anxiety.  Discussed medication side effects and benefits.  Recommended to call us back if he has any question or any concern.  Follow-up in 3 months. Discuss safety plan that anytime having active suicidal thoughts or homicidal thoughts and he need to call 911 or go to local emergency room.  Abrahim Sargent T., MD 08/22/2015

## 2015-09-20 ENCOUNTER — Ambulatory Visit (INDEPENDENT_AMBULATORY_CARE_PROVIDER_SITE_OTHER): Payer: BLUE CROSS/BLUE SHIELD | Admitting: Family Medicine

## 2015-09-20 ENCOUNTER — Encounter: Payer: Self-pay | Admitting: Family Medicine

## 2015-09-20 VITALS — BP 120/68 | HR 83 | Temp 98.8°F | Resp 16 | Ht 69.0 in | Wt 179.4 lb

## 2015-09-20 DIAGNOSIS — L732 Hidradenitis suppurativa: Secondary | ICD-10-CM | POA: Insufficient documentation

## 2015-09-20 DIAGNOSIS — L0292 Furuncle, unspecified: Secondary | ICD-10-CM | POA: Diagnosis not present

## 2015-09-20 MED ORDER — DOXYCYCLINE HYCLATE 100 MG PO TABS: 100.0000 mg | ORAL_TABLET | Freq: Two times a day (BID) | ORAL | Status: DC

## 2015-09-20 NOTE — Progress Notes (Signed)
Pre visit review using our clinic review tool, if applicable. No additional management support is needed unless otherwise documented below in the visit note. 

## 2015-09-20 NOTE — Patient Instructions (Signed)
Follow up as needed Start the Doxycycline twice daily- take w/ food Apply warm compresses to help facilitate drainage Tylenol or ibuprofen for pain Call with any questions or concerns- particularly if not getting better Hang in there!!!

## 2015-09-20 NOTE — Assessment & Plan Note (Signed)
New.  Pt w/ 3 active areas and 2 resolving areas in R axilla.  No area appropriate for I&D.  Start abx.  Reviewed supportive care and red flags that should prompt return.  Pt expressed understanding and is in agreement w/ plan.

## 2015-09-20 NOTE — Progress Notes (Signed)
   Subjective:    Patient ID: Timothy Burnett, male    DOB: 01/18/1966, 50 y.o.   MRN: [id number]  HPI Rash- 1st noticed ~10 days ago.  Painful, under R axilla.  No one at home w/ similar rash.  Some drainage.  No fevers.  No new deodorants or products.   Review of Systems For ROS see HPI     Objective:   Physical Exam  Constitutional: He is oriented to person, place, and time. He appears well-developed and well-nourished. No distress.  HENT:  Head: Normocephalic and atraumatic.  Cardiovascular: Intact distal pulses.   Neurological: He is alert and oriented to person, place, and time.  Skin: Skin is warm and dry. There is erythema (pt has erythema overlying 3 large indurated areas in R axilla w/ 2 other resolving areas.  no fluctuance for I&D).  Psychiatric: He has a normal mood and affect. His behavior is normal. Thought content normal.  Vitals reviewed.         Assessment & Plan:

## 2015-10-19 ENCOUNTER — Encounter: Payer: Self-pay | Admitting: Family Medicine

## 2015-10-19 ENCOUNTER — Other Ambulatory Visit: Payer: Self-pay | Admitting: General Practice

## 2015-10-19 ENCOUNTER — Ambulatory Visit (INDEPENDENT_AMBULATORY_CARE_PROVIDER_SITE_OTHER): Payer: BLUE CROSS/BLUE SHIELD | Admitting: Family Medicine

## 2015-10-19 VITALS — BP 110/70 | HR 80 | Temp 98.0°F | Resp 16 | Ht 69.0 in | Wt 179.4 lb

## 2015-10-19 DIAGNOSIS — L732 Hidradenitis suppurativa: Secondary | ICD-10-CM

## 2015-10-19 MED ORDER — DOXYCYCLINE HYCLATE 100 MG PO TABS: 100.0000 mg | ORAL_TABLET | Freq: Two times a day (BID) | ORAL | Status: DC

## 2015-10-19 NOTE — Patient Instructions (Signed)
We will notify you of your surgery appt Restart the Doxy twice daily Apply warm compresses to the area to facilitate drainage Call with any questions or concerns Hang in there!!!

## 2015-10-19 NOTE — Assessment & Plan Note (Signed)
Recurrent problem for pt.  Deteriorated from initial visit.  Was able to drain quite a bit of pus from large, central boil using firm, steady pressure but I believe surgery is going to be needed to open this area as the 2 sites appear to be connected.  Restart Doxy.  Refer to surgery for complete evaluation.  Reviewed supportive care and red flags that should prompt return.  Pt expressed understanding and is in agreement w/ plan.

## 2015-10-19 NOTE — Progress Notes (Signed)
Pre visit review using our clinic review tool, if applicable. No additional management support is needed unless otherwise documented below in the visit note. 

## 2015-10-19 NOTE — Addendum Note (Signed)
Addended by: Davis Gourd on: 10/19/2015 11:56 AM   Modules accepted: Orders

## 2015-10-19 NOTE — Progress Notes (Signed)
   Subjective:    Patient ID: Timothy Burnett, male    DOB: 1965/11/05, 50 y.o.   MRN: [id number]  HPI Boils- pt was seen 1 month ago w/ similar.  Reports Doxy improved the areas but they 'returned w/ a vengeance'.  Boils returned last week under R arm.  Pt reports they came to a head and have been draining but not getting smaller.  Very painful.   Review of Systems For ROS see HPI     Objective:   Physical Exam  Constitutional: He is oriented to person, place, and time. He appears well-developed and well-nourished. No distress.  HENT:  Head: Normocephalic and atraumatic.  Neurological: He is alert and oriented to person, place, and time.  Skin: Skin is warm. There is erythema.  2 large boils under R axilla- central one has purulent drainage from multiple pores w/in the boil.  More medial area is indurated but not draining.  Large amount of surrounding erythema  Vitals reviewed.         Assessment & Plan:

## 2015-10-22 LAB — WOUND CULTURE: Gram Stain: NONE SEEN

## 2015-11-22 ENCOUNTER — Ambulatory Visit (INDEPENDENT_AMBULATORY_CARE_PROVIDER_SITE_OTHER): Payer: BLUE CROSS/BLUE SHIELD | Admitting: Psychiatry

## 2015-11-22 ENCOUNTER — Other Ambulatory Visit (HOSPITAL_COMMUNITY): Payer: Self-pay | Admitting: Psychiatry

## 2015-11-22 ENCOUNTER — Encounter (HOSPITAL_COMMUNITY): Payer: Self-pay | Admitting: Psychiatry

## 2015-11-22 VITALS — BP 124/72 | HR 87 | Ht 68.0 in | Wt 179.6 lb

## 2015-11-22 DIAGNOSIS — F3130 Bipolar disorder, current episode depressed, mild or moderate severity, unspecified: Secondary | ICD-10-CM

## 2015-11-22 DIAGNOSIS — F319 Bipolar disorder, unspecified: Secondary | ICD-10-CM

## 2015-11-22 MED ORDER — SERTRALINE HCL 100 MG PO TABS: 100.0000 mg | ORAL_TABLET | Freq: Every day | ORAL | 0 refills | Status: DC

## 2015-11-22 MED ORDER — LORAZEPAM 0.5 MG PO TABS: 0.5000 mg | ORAL_TABLET | Freq: Every day | ORAL | 2 refills | Status: DC

## 2015-11-22 MED ORDER — LAMOTRIGINE 150 MG PO TABS: 150.0000 mg | ORAL_TABLET | Freq: Two times a day (BID) | ORAL | 0 refills | Status: DC

## 2015-11-22 MED ORDER — OLANZAPINE 20 MG PO TABS: 20.0000 mg | ORAL_TABLET | Freq: Every day | ORAL | 0 refills | Status: DC

## 2015-11-22 NOTE — Progress Notes (Signed)
[facility] MD/PA/NP OP Progress Note  11/22/2015 12:13 PM Timothy Burnett  MRN:  [id number]  Chief Complaint:routine med check  Subjective:  Everything is okay HPI:  Timothy Burnett says meds are fine without side effects.  Mood is good.  He dealt with daughter's not wishing to have him at her graduation well enough.  He wrote her a long letter expressing his love for her and wish to be part of her life aon her terms.  She has not responded.  No psychotic thoughts. Visit Diagnosis: Bipolar 1, most recently depressed  Past Psychiatric History: no changes  Past Medical History:  Past Medical History:  Diagnosis Date  . Bipolar disorder ([place])   . Depression   . Hyperlipemia   . Ulcer    stomach    Past Surgical History:  Procedure Laterality Date  . feet surgery     bone spurs  . FOOT SURGERY  2012   Bil  . TONSILLECTOMY      Family Psychiatric History: no changes  Family History:  Family History  Problem Relation Age of Onset  . Hyperlipidemia Mother   . Heart disease Mother   . Hypertension Mother   . Hyperlipidemia Father     Social History:  Social History   Social History  . Marital status: Married    Spouse name: N/A  . Number of children: N/A  . Years of education: N/A   Social History Main Topics  . Smoking status: Never Smoker  . Smokeless tobacco: Never Used  . Alcohol use 0.0 oz/week  . Drug use: No  . Sexual activity: Not Currently   Other Topics Concern  . None   Social History Narrative  . None    Allergies:  Allergies  Allergen Reactions  . Penicillins Rash    Metabolic Disorder Labs: Lab Results  Component Value Date   HGBA1C 5.5 08/08/2011   MPG 111 08/08/2011   No results found for: PROLACTIN Lab Results  Component Value Date   CHOL 169 07/04/2015   TRIG 130.0 07/04/2015   HDL 33.50 (L) 07/04/2015   CHOLHDL 5 07/04/2015   VLDL 26.0 07/04/2015   LDLCALC 110 (H) 07/04/2015   [place] 95 02/06/2014     Current Medications: Current  Outpatient Prescriptions  Medication Sig Dispense Refill  . atorvastatin (LIPITOR) 40 MG tablet TAKE ONE TABLET BY MOUTH AT BEDTIME 90 tablet 1  . doxycycline (VIBRA-TABS) 100 MG tablet Take 1 tablet (100 mg total) by mouth 2 (two) times daily. 20 tablet 0  . fenofibrate 160 MG tablet Take 1 tablet (160 mg total) by mouth daily. 90 tablet 1  . lamoTRIgine (LAMICTAL) 150 MG tablet TAKE ONE TABLET BY MOUTH TWICE DAILY 180 tablet 0  . LORazepam (ATIVAN) 0.5 MG tablet Take 1 tablet (0.5 mg total) by mouth at bedtime. 30 tablet 0  . Multiple Vitamin (MULTIVITAMIN) tablet Take 1 tablet by mouth daily.    Marland Kitchen OLANZapine (ZYPREXA) 20 MG tablet TAKE ONE TABLET BY MOUTH AT BEDTIME 90 tablet 0  . sertraline (ZOLOFT) 100 MG tablet TAKE ONE TABLET BY MOUTH ONCE DAILY 90 tablet 0   No current facility-administered medications for this visit.     Neurologic: Headache: Negative Seizure: Negative Paresthesias: Negative  Musculoskeletal: Strength & Muscle Tone: within normal limits Gait & Station: normal Patient leans: N/A  Psychiatric Specialty Exam: ROS  Blood pressure 124/72, pulse 87, height 5\' 8"  (1.727 m), weight 179 lb 9.6 oz (81.5 kg).Body mass index is 27.31 kg/m.  General Appearance: Well Groomed  Eye Contact:  Good  Speech:  Clear and Coherent  Volume:  Normal  Mood:  Euthymic  Affect:  Appropriate  Thought Process:  Coherent  Orientation:  Full (Time, Place, and Person)  Thought Content: Logical   Suicidal Thoughts:  No  Homicidal Thoughts:  No  Memory:  Immediate;   Good Recent;   Good Remote;   Good  Judgement:  Intact  Insight:  Good  Psychomotor Activity:  Normal  Concentration:  Concentration: Good and Attention Span: Good  Recall:  Good  Fund of Knowledge: Good  Language: Good  Akathisia:  Negative  Handed:  Right  AIMS (if indicated):  0  Assets:  Communication Skills Desire for Improvement Financial Resources/Insurance Housing Intimacy Leisure Time Physical  Health Resilience Social Support Talents/Skills Transportation Vocational/Educational  ADL's:  Intact  Cognition: WNL  Sleep:  adequate     Treatment Plan Summary: Mood disorder:  Mood stable.  Continue Zyprexa 20 mg daily, lamotrigine 300 mg daily and sertraline 100 mg daily Anxiety:  Under control, continue lorazepam 0.5 mg daily as needed and sertraline 100 mg daily Return 3 months   Donnelly Angelica, MD 11/22/2015, 12:13 PM

## 2016-01-03 ENCOUNTER — Encounter: Payer: Self-pay | Admitting: Family Medicine

## 2016-02-26 ENCOUNTER — Encounter (HOSPITAL_COMMUNITY): Payer: Self-pay | Admitting: Psychiatry

## 2016-02-26 ENCOUNTER — Ambulatory Visit (INDEPENDENT_AMBULATORY_CARE_PROVIDER_SITE_OTHER): Payer: BLUE CROSS/BLUE SHIELD | Admitting: Psychiatry

## 2016-02-26 VITALS — BP 125/85 | HR 85 | Ht 68.5 in | Wt 182.8 lb

## 2016-02-26 DIAGNOSIS — F319 Bipolar disorder, unspecified: Secondary | ICD-10-CM

## 2016-02-26 DIAGNOSIS — Z79899 Other long term (current) drug therapy: Secondary | ICD-10-CM

## 2016-02-26 MED ORDER — OLANZAPINE 20 MG PO TABS
20.0000 mg | ORAL_TABLET | Freq: Every day | ORAL | 0 refills | Status: DC
Start: 2016-02-26 — End: 2016-05-27

## 2016-02-26 MED ORDER — LORAZEPAM 0.5 MG PO TABS: 0.5000 mg | ORAL_TABLET | Freq: Every day | ORAL | 2 refills | Status: DC | PRN

## 2016-02-26 MED ORDER — LAMOTRIGINE 150 MG PO TABS: 150.0000 mg | ORAL_TABLET | Freq: Two times a day (BID) | ORAL | 0 refills | Status: DC

## 2016-02-26 MED ORDER — SERTRALINE HCL 100 MG PO TABS: 100.0000 mg | ORAL_TABLET | Freq: Every day | ORAL | 0 refills | Status: DC

## 2016-02-26 NOTE — Progress Notes (Signed)
[facility] Progress Note  Timothy Burnett [id number] 50 y.o.  02/26/2016 9:36 AM  Chief Complaint:  I'm doing good on medication.  I did not get any despondency from a daughter .             History of Present Illness: Timothy Burnett came for his follow-up appointment.  He is taking his medication as prescribed.  He was disappointed because his daughter did not responding to his letter and now he find out that she changed her last name.  Patient initially was very sad but now he is having much better.  He continues to work 2 jobs and keep himself busy.  He admitted some time job at [employer] is very stressful.  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  He endorses Thanksgiving was very busy because he was working.  However he has a plan to see his father in Christmas oh lives in [place].  He sleeping good.  He denies any concern from the medication.  He has no tremors, shakes or any EPS.  He like to continue Zyprexa, Zoloft, Lamictal and Ativan as needed.  His energy level is good.  His appetite is okay.  Patient denies drinking alcohol or using any illegal substance.  Currently he is not in any relationship.  Suicidal Ideation: No Plan Formed: No Patient has means to carry out plan: No  Homicidal Ideation: No Plan Formed: No Patient has means to carry out plan: No  Review of Systems  Constitutional: Negative.   HENT: Negative.   Cardiovascular: Negative.   Gastrointestinal: Negative.   Musculoskeletal: Negative.   Skin: Negative.  Negative for rash.  Neurological: Negative.     Psychiatric: Agitation: No Hallucination: No Depressed Mood: No Insomnia: No Hypersomnia: No Altered Concentration: No Feels Worthless: No Grandiose Ideas: No Belief In Special Powers: No New/Increased Substance Abuse: No Compulsions: No  Neurologic: Headache: No Seizure: No Paresthesias: No  Medical History:  Patient has hyperlipidemia.  He sees Dr. Birdie Riddle.  Outpatient  Encounter Prescriptions as of 02/26/2016  Medication Sig Dispense Refill  . atorvastatin (LIPITOR) 40 MG tablet TAKE ONE TABLET BY MOUTH AT BEDTIME 90 tablet 1  . fenofibrate 160 MG tablet Take 1 tablet (160 mg total) by mouth daily. 90 tablet 1  . lamoTRIgine (LAMICTAL) 150 MG tablet Take 1 tablet (150 mg total) by mouth 2 (two) times daily. 180 tablet 0  . LORazepam (ATIVAN) 0.5 MG tablet Take 1 tablet (0.5 mg total) by mouth daily as needed for anxiety. 30 tablet 2  . Multiple Vitamin (MULTIVITAMIN) tablet Take 1 tablet by mouth daily.    Marland Kitchen OLANZapine (ZYPREXA) 20 MG tablet Take 1 tablet (20 mg total) by mouth at bedtime. 90 tablet 0  . sertraline (ZOLOFT) 100 MG tablet Take 1 tablet (100 mg total) by mouth daily. 90 tablet 0  . [DISCONTINUED] lamoTRIgine (LAMICTAL) 150 MG tablet Take 1 tablet (150 mg total) by mouth 2 (two) times daily. 180 tablet 0  . [DISCONTINUED] LORazepam (ATIVAN) 0.5 MG tablet Take 1 tablet (0.5 mg total) by mouth at bedtime. 30 tablet 2  . [DISCONTINUED] OLANZapine (ZYPREXA) 20 MG tablet Take 1 tablet (20 mg total) by mouth at bedtime. 90 tablet 0  . [DISCONTINUED] sertraline (ZOLOFT) 100 MG tablet Take 1 tablet (100 mg total) by mouth daily. 90 tablet 0  . [DISCONTINUED] doxycycline (VIBRA-TABS) 100 MG tablet Take 1 tablet (100 mg total) by mouth 2 (two) times daily. (Patient not taking:  Reported on 02/26/2016) 20 tablet 0   No facility-administered encounter medications on file as of 02/26/2016.     Past Psychiatric History/Hospitalization(s): Patient had tried Abilify and Geodon in the past with limited improvement.  He has a history of impulsive behavior. Anxiety: Yes Bipolar Disorder: Yes Depression: Yes Mania: Yes Psychosis: No Schizophrenia: No Personality Disorder: No Hospitalization for psychiatric illness: No History of Electroconvulsive Shock Therapy: No Prior Suicide Attempts: No  Physical Exam: Constitutional:  BP 125/85 (BP Location: Left  Arm, Patient Position: Sitting, Cuff Size: Normal)   Pulse 85   Ht 5' 8.5" (1.74 m)   Wt 182 lb 12.8 oz (82.9 kg)   BMI 27.39 kg/m     No results found for this or any previous visit (from the past 2160 hour(s)).  General Appearance: well nourished, casually dressed and fairly groomed.  Musculoskeletal: Strength & Muscle Tone: within normal limits Gait & Station: normal Patient leans: N/A  Mental Status Examination  patient is well-groomed and well-dressed man who appears to be in his stated age.  He is pleasant and cooperative and maintained good eye contact.  His speech is fluent, clear and coherent.  He described his mood euthymic and his affect is appropriate.  He denies any active or passive suicidal thoughts or homicidal thought.  He denies any auditory or visual hallucination.  There were no paranoia, delusion or any obsessive thoughts.  His fund of knowledge is adequate.  His psychomotor activity is normal.  His attention and concentration is good.  He is alert and oriented 3.  His insight judgment and impulse control is okay.  Established Problem, Stable/Improving (1), Review of Last Therapy Session (1) and Review of Medication Regimen & Side Effects (2)  Assessment: Axis I: Bipolar disorder  Axis II: Deferred  Axis III:  Patient Active Problem List   Diagnosis Date Noted  . Hidradenitis suppurativa of right axilla 09/20/2015  . Hyperlipidemia 06/11/2012    Plan:  Patient is doing good on his current psychiatric medication.  He does not want to change his medication.  He has no side effects.  I will continue Zyprexa 20 mg daily, Lamictal 300 mg daily, Zoloft 1 mg daily and Ativan 0.5 mg as needed for severe anxiety.  Discussed medication side effects and benefits.  Recommended to call us back if he has any question, concern or if any worsening of the symptom.  Follow-up in 3 months.Discussed medication side effects and benefits.  Recommended to call us back if there is  any question, concern or worsening of the symptoms.  Discuss safety plan that anytime having active suicidal thoughts or homicidal thoughts and she need to call 911 or go to the local emergency room.  Timothy Tigges T., MD 02/26/2016

## 2016-04-03 ENCOUNTER — Encounter: Payer: Self-pay | Admitting: Family Medicine

## 2016-04-03 ENCOUNTER — Ambulatory Visit (INDEPENDENT_AMBULATORY_CARE_PROVIDER_SITE_OTHER): Payer: BLUE CROSS/BLUE SHIELD | Admitting: Family Medicine

## 2016-04-03 DIAGNOSIS — Z0000 Encounter for general adult medical examination without abnormal findings: Secondary | ICD-10-CM | POA: Diagnosis not present

## 2016-04-03 LAB — CBC WITH DIFFERENTIAL/PLATELET
BASOS PCT: 0 %
Basophils Absolute: 0 cells/uL (ref 0–200)
EOS ABS: 182 {cells}/uL (ref 15–500)
EOS PCT: 2 %
HCT: 43.9 % (ref 38.5–50.0)
Hemoglobin: 14.9 g/dL (ref 13.2–17.1)
Lymphocytes Relative: 30 %
Lymphs Abs: 2730 cells/uL (ref 850–3900)
MCH: 31.3 pg (ref 27.0–33.0)
MCHC: 33.9 g/dL (ref 32.0–36.0)
MCV: 92.2 fL (ref 80.0–100.0)
MONOS PCT: 8 %
MPV: 10 fL (ref 7.5–12.5)
Monocytes Absolute: 728 cells/uL (ref 200–950)
NEUTROS ABS: 5460 {cells}/uL (ref 1500–7800)
Neutrophils Relative %: 60 %
PLATELETS: 241 10*3/uL (ref 140–400)
RBC: 4.76 MIL/uL (ref 4.20–5.80)
RDW: 14.5 % (ref 11.0–15.0)
WBC: 9.1 10*3/uL (ref 3.8–10.8)

## 2016-04-03 LAB — LIPID PANEL
Cholesterol: 209 mg/dL — ABNORMAL HIGH (ref <200)
HDL: 30 mg/dL — ABNORMAL LOW (ref >40)
LDL Cholesterol: 120 mg/dL — ABNORMAL HIGH (ref <100)
TRIGLYCERIDES: 296 mg/dL — AB (ref <150)
Total CHOL/HDL Ratio: 7 Ratio — ABNORMAL HIGH (ref <5.0)
VLDL: 59 mg/dL — AB (ref <30)

## 2016-04-03 LAB — HEPATIC FUNCTION PANEL
ALBUMIN: 4.4 g/dL (ref 3.6–5.1)
ALK PHOS: 89 U/L (ref 40–115)
ALT: 24 U/L (ref 9–46)
AST: 18 U/L (ref 10–35)
Bilirubin, Direct: 0.1 mg/dL (ref <=0.2)
Indirect Bilirubin: 0.3 mg/dL (ref 0.2–1.2)
Total Bilirubin: 0.4 mg/dL (ref 0.2–1.2)
Total Protein: 6.7 g/dL (ref 6.1–8.1)

## 2016-04-03 LAB — BASIC METABOLIC PANEL
BUN: 15 mg/dL (ref 7–25)
CHLORIDE: 105 mmol/L (ref 98–110)
CO2: 25 mmol/L (ref 20–31)
CREATININE: 0.99 mg/dL (ref 0.70–1.33)
Calcium: 9.3 mg/dL (ref 8.6–10.3)
Glucose, Bld: 83 mg/dL (ref 65–99)
POTASSIUM: 4.3 mmol/L (ref 3.5–5.3)
Sodium: 140 mmol/L (ref 135–146)

## 2016-04-03 LAB — PSA: PSA: 0.8 ng/mL (ref <=4.0)

## 2016-04-03 LAB — TSH: TSH: 1.84 mIU/L (ref 0.40–4.50)

## 2016-04-03 MED ORDER — ATORVASTATIN CALCIUM 40 MG PO TABS: ORAL_TABLET | ORAL | 1 refills | Status: DC

## 2016-04-03 MED ORDER — FENOFIBRATE 160 MG PO TABS: 160.0000 mg | ORAL_TABLET | Freq: Every day | ORAL | 1 refills | Status: DC

## 2016-04-03 NOTE — Progress Notes (Signed)
   Subjective:    Patient ID: Timothy Burnett, male    DOB: 06-Mar-1966, 51 y.o.   MRN: [id number]  HPI CPE- UTD on colonoscopy, Tdap.  No concerns today.  Declines flu   Review of Systems Patient reports no vision/hearing changes, anorexia, fever ,adenopathy, persistant/recurrent hoarseness, swallowing issues, chest pain, palpitations, edema, persistant/recurrent cough, hemoptysis, dyspnea (rest,exertional, paroxysmal nocturnal), gastrointestinal  bleeding (melena, rectal bleeding), abdominal pain, excessive heart burn, GU symptoms (dysuria, hematuria, voiding/incontinence issues) syncope, focal weakness, memory loss, numbness & tingling, skin/hair/nail changes, depression, anxiety, abnormal bruising/bleeding, musculoskeletal symptoms/signs.     Objective:   Physical Exam BP 118/73   Pulse 83   Temp 99 F (37.2 C) (Oral)   Resp 16   Ht 5\' 9"  (1.753 m)   Wt 186 lb 2 oz (84.4 kg)   SpO2 98%   BMI 27.49 kg/m   General Appearance:    Alert, cooperative, no distress, appears stated age  Head:    Normocephalic, without obvious abnormality, atraumatic  Eyes:    PERRL, conjunctiva/corneas clear, EOM's intact, fundi    benign, both eyes       Ears:    Normal TM's and external ear canals, both ears  Nose:   Nares normal, septum midline, mucosa normal, no drainage   or sinus tenderness  Throat:   Lips, mucosa, and tongue normal; teeth and gums normal  Neck:   Supple, symmetrical, trachea midline, no adenopathy;       thyroid:  No enlargement/tenderness/nodules  Back:     Symmetric, no curvature, ROM normal, no CVA tenderness  Lungs:     Clear to auscultation bilaterally, respirations unlabored  Chest wall:    No tenderness or deformity  Heart:    Regular rate and rhythm, S1 and S2 normal, no murmur, rub   or gallop  Abdomen:     Soft, non-tender, bowel sounds active all four quadrants,    no masses, no organomegaly  Genitalia:    Normal male without lesion, discharge or tenderness  Rectal:     Normal tone, normal prostate, no masses or tenderness  Extremities:   Extremities normal, atraumatic, no cyanosis or edema  Pulses:   2+ and symmetric all extremities  Skin:   Skin color, texture, turgor normal, no rashes or lesions  Lymph nodes:   Cervical, supraclavicular, and axillary nodes normal  Neurologic:   CNII-XII intact. Normal strength, sensation and reflexes      throughout          Assessment & Plan:

## 2016-04-03 NOTE — Progress Notes (Signed)
Pre visit review using our clinic review tool, if applicable. No additional management support is needed unless otherwise documented below in the visit note. 

## 2016-04-03 NOTE — Patient Instructions (Signed)
Follow up in 6 months to recheck cholesterol We'll notify you of your lab results and make any changes if needed Continue to work on healthy diet and regular exercise- you can do it! Call with any questions or concerns Happy New Year!!!

## 2016-05-27 ENCOUNTER — Ambulatory Visit (INDEPENDENT_AMBULATORY_CARE_PROVIDER_SITE_OTHER): Payer: BLUE CROSS/BLUE SHIELD | Admitting: Psychiatry

## 2016-05-27 ENCOUNTER — Encounter (HOSPITAL_COMMUNITY): Payer: Self-pay | Admitting: Psychiatry

## 2016-05-27 DIAGNOSIS — Z8249 Family history of ischemic heart disease and other diseases of the circulatory system: Secondary | ICD-10-CM

## 2016-05-27 DIAGNOSIS — F319 Bipolar disorder, unspecified: Secondary | ICD-10-CM

## 2016-05-27 DIAGNOSIS — Z8349 Family history of other endocrine, nutritional and metabolic diseases: Secondary | ICD-10-CM

## 2016-05-27 DIAGNOSIS — Z9889 Other specified postprocedural states: Secondary | ICD-10-CM | POA: Diagnosis not present

## 2016-05-27 DIAGNOSIS — Z79899 Other long term (current) drug therapy: Secondary | ICD-10-CM

## 2016-05-27 DIAGNOSIS — Z888 Allergy status to other drugs, medicaments and biological substances status: Secondary | ICD-10-CM

## 2016-05-27 MED ORDER — OLANZAPINE 20 MG PO TABS: 20.0000 mg | ORAL_TABLET | Freq: Every day | ORAL | 0 refills | Status: DC

## 2016-05-27 MED ORDER — LAMOTRIGINE 150 MG PO TABS: 150.0000 mg | ORAL_TABLET | Freq: Two times a day (BID) | ORAL | 0 refills | Status: DC

## 2016-05-27 MED ORDER — SERTRALINE HCL 100 MG PO TABS: 100.0000 mg | ORAL_TABLET | Freq: Every day | ORAL | 0 refills | Status: DC

## 2016-05-27 NOTE — Progress Notes (Signed)
BH MD/PA/NP OP Progress Note  05/27/2016 11:24 AM Timothy Burnett  MRN:  [id number]  Chief Complaint:  Chief Complaint    Follow-up     Subjective:  I'm doing so-so.  I'm busy at work.  HPI: Timothy Burnett came for his follow-up appointment.  He is taking his medication as prescribed.  Lately he was feeling sad as he tried to communicate with her 52 year old daughter on her birthday but he did not get any despondent back.  He was disappointed.  Overall he continues to work 2 jobs and try to keep him busy.  He is working at [employer] and also at [employer].  He admitted some time job is stressful but he is handling much better.  He denies any irritability, anger, mania, hallucination or any feeling of hopelessness or suicidal thoughts.  Recently he seen his primary care physician and he had blood work.  He also gained weight from the past because he admitted that he is not doing any exercise.  He is compliant with medication and reported no side effects including any tremors or shakes.  His energy level is okay.  Patient denies drinking alcohol or using any illegal substances.  He lives by himself in currently not in any relationship.  Visit Diagnosis:    ICD-9-CM ICD-10-CM   1. Bipolar 1 disorder (HCC) 296.7 F31.9 sertraline (ZOLOFT) 100 MG tablet     OLANZapine (ZYPREXA) 20 MG tablet     lamoTRIgine (LAMICTAL) 150 MG tablet    Past Psychiatric History: In the past he had tried Abilify and Geodon but limited efficacy.  Patient has history of impulsive behavior.  Past Medical History:  Past Medical History:  Diagnosis Date  . Bipolar disorder ([place])   . Depression   . Hyperlipemia   . Ulcer ([place])    stomach    Past Surgical History:  Procedure Laterality Date  . feet surgery     bone spurs  . FOOT SURGERY  2012   Bil  . TONSILLECTOMY      Family Psychiatric History: Reviewed.  Family History:  Family History  Problem Relation Age of Onset  . Hyperlipidemia Mother   . Heart disease Mother    . Hypertension Mother   . Hyperlipidemia Father     Social History:  Social History   Social History  . Marital status: Married    Spouse name: N/A  . Number of children: N/A  . Years of education: N/A   Social History Main Topics  . Smoking status: Never Smoker  . Smokeless tobacco: Never Used  . Alcohol use No     Comment: Occasional   . Drug use: No  . Sexual activity: Not Currently   Other Topics Concern  . None   Social History Narrative  . None    Allergies:  Allergies  Allergen Reactions  . Penicillins Rash    Metabolic Disorder Labs: Recent Results (from the past 2160 hour(s))  Lipid panel     Status: Abnormal   Collection Time: 04/03/16  4:13 PM  Result Value Ref Range   Cholesterol 209 (H) <200 mg/dL   Triglycerides 296 (H) <150 mg/dL   HDL 30 (L) >40 mg/dL   Total CHOL/HDL Ratio 7.0 (H) <5.0 Ratio   VLDL 59 (H) <30 mg/dL   LDL Cholesterol 120 (H) <100 mg/dL  Basic metabolic panel     Status: None   Collection Time: 04/03/16  4:13 PM  Result Value Ref Range   Sodium 140 135 -  146 mmol/L   Potassium 4.3 3.5 - 5.3 mmol/L   Chloride 105 98 - 110 mmol/L   CO2 25 20 - 31 mmol/L   Glucose, Bld 83 65 - 99 mg/dL   BUN 15 7 - 25 mg/dL   Creat 0.99 0.70 - 1.33 mg/dL    Comment:   For patients > or = 52 years of age: The upper reference limit for Creatinine is approximately 13% higher for people identified as African-American.      Calcium 9.3 8.6 - 10.3 mg/dL  TSH     Status: None   Collection Time: 04/03/16  4:13 PM  Result Value Ref Range   TSH 1.84 0.40 - 4.50 mIU/L  Hepatic function panel     Status: None   Collection Time: 04/03/16  4:13 PM  Result Value Ref Range   Total Bilirubin 0.4 0.2 - 1.2 mg/dL   Bilirubin, Direct 0.1 <=0.2 mg/dL   Indirect Bilirubin 0.3 0.2 - 1.2 mg/dL   Alkaline Phosphatase 89 40 - 115 U/L   AST 18 10 - 35 U/L   ALT 24 9 - 46 U/L   Total Protein 6.7 6.1 - 8.1 g/dL   Albumin 4.4 3.6 - 5.1 g/dL  CBC with  Differential/Platelet     Status: None   Collection Time: 04/03/16  4:13 PM  Result Value Ref Range   WBC 9.1 3.8 - 10.8 K/uL   RBC 4.76 4.20 - 5.80 MIL/uL   Hemoglobin 14.9 13.2 - 17.1 g/dL   HCT 43.9 38.5 - 50.0 %   MCV 92.2 80.0 - 100.0 fL   MCH 31.3 27.0 - 33.0 pg   MCHC 33.9 32.0 - 36.0 g/dL   RDW 14.5 11.0 - 15.0 %   Platelets 241 140 - 400 K/uL   MPV 10.0 7.5 - 12.5 fL   Neutro Abs 5,460 1,500 - 7,800 cells/uL   Lymphs Abs 2,730 850 - 3,900 cells/uL   Monocytes Absolute 728 200 - 950 cells/uL   Eosinophils Absolute 182 15 - 500 cells/uL   Basophils Absolute 0 0 - 200 cells/uL   Neutrophils Relative % 60 %   Lymphocytes Relative 30 %   Monocytes Relative 8 %   Eosinophils Relative 2 %   Basophils Relative 0 %   Smear Review Criteria for review not met   PSA     Status: None   Collection Time: 04/03/16  4:13 PM  Result Value Ref Range   PSA 0.8 <=4.0 ng/mL    Comment:   The total PSA value from this assay system is standardized against the WHO standard. The test result will be approximately 20% lower when compared to the equimolar-standardized total PSA ([employer]). Comparison of serial PSA results should be interpreted with this fact in mind.   This test was performed using the Siemens chemiluminescent method. Values obtained from different assay methods cannot be used interchangeably. PSA levels, regardless of value, should not be interpreted as absolute evidence of the presence or absence of disease.      Lab Results  Component Value Date   HGBA1C 5.5 08/08/2011   MPG 111 08/08/2011   No results found for: PROLACTIN Lab Results  Component Value Date   CHOL 209 (H) 04/03/2016   TRIG 296 (H) 04/03/2016   HDL 30 (L) 04/03/2016   CHOLHDL 7.0 (H) 04/03/2016   VLDL 59 (H) 04/03/2016   LDLCALC 120 (H) 04/03/2016   LDLCALC 110 (H) 07/04/2015     Current Medications:  Current Outpatient Prescriptions  Medication Sig Dispense Refill  . atorvastatin  (LIPITOR) 40 MG tablet TAKE ONE TABLET BY MOUTH AT BEDTIME 90 tablet 1  . fenofibrate 160 MG tablet Take 1 tablet (160 mg total) by mouth daily. 90 tablet 1  . lamoTRIgine (LAMICTAL) 150 MG tablet Take 1 tablet (150 mg total) by mouth 2 (two) times daily. 180 tablet 0  . LORazepam (ATIVAN) 0.5 MG tablet Take 1 tablet (0.5 mg total) by mouth daily as needed for anxiety. 30 tablet 2  . Multiple Vitamin (MULTIVITAMIN) tablet Take 1 tablet by mouth daily.    Marland Kitchen OLANZapine (ZYPREXA) 20 MG tablet Take 1 tablet (20 mg total) by mouth at bedtime. 90 tablet 0  . sertraline (ZOLOFT) 100 MG tablet Take 1 tablet (100 mg total) by mouth daily. 90 tablet 0   No current facility-administered medications for this visit.     Neurologic: Headache: No Seizure: No Paresthesias: No  Musculoskeletal: Strength & Muscle Tone: within normal limits Gait & Station: normal Patient leans: N/A  Psychiatric Specialty Exam: Review of Systems  Constitutional: Negative for weight loss.  Respiratory: Negative.   Musculoskeletal: Negative.   Skin: Negative for itching and rash.  Neurological: Negative.     Blood pressure 124/82, pulse 79, height 5' 8"  (1.727 m), weight 186 lb 12.8 oz (84.7 kg).Body mass index is 28.4 kg/m.  General Appearance: Well Groomed  Eye Contact:  Good  Speech:  Clear and Coherent  Volume:  Normal  Mood:  Anxious  Affect:  Appropriate  Thought Process:  Goal Directed  Orientation:  Full (Time, Place, and Person)  Thought Content: WDL and Logical   Suicidal Thoughts:  No  Homicidal Thoughts:  No  Memory:  Immediate;   Good Recent;   Good Remote;   Good  Judgement:  Good  Insight:  Good  Psychomotor Activity:  Normal  Concentration:  Concentration: Good and Attention Span: Good  Recall:  Good  Fund of Knowledge: Good  Language: Good  Akathisia:  No  Handed:  Right  AIMS (if indicated):  0  Assets:  Communication Skills Desire for Improvement Housing Resilience  ADL's:   Intact  Cognition: WNL  Sleep:  Good     Assessment: Bipolar disorder  Plan: Patient is a stable on his current psychiatric medication.  I review his blood work, his cholesterol is slightly increase.  His other labs are normal.  He had gained weight from the past and encouraged him to watch his calorie intake and to regular exercise.  I will continue Zyprexa 20 mg daily, Lamictal 300 mg daily, Zoloft 100 mg daily and lorazepam 0.5 mg as needed.  He does not need a new prescription of lorazepam today.  He has no rash or itching.  Discussed medication side effects and benefits.  Recommended to call us back if he has any question, concern or if he feel worsening of the symptom.  Follow-up in 3 months.  Saurav Crumble T., MD 05/27/2016, 11:24 AM

## 2016-08-26 ENCOUNTER — Encounter (HOSPITAL_COMMUNITY): Payer: Self-pay | Admitting: Psychiatry

## 2016-08-26 ENCOUNTER — Ambulatory Visit (INDEPENDENT_AMBULATORY_CARE_PROVIDER_SITE_OTHER): Payer: BLUE CROSS/BLUE SHIELD | Admitting: Psychiatry

## 2016-08-26 DIAGNOSIS — F319 Bipolar disorder, unspecified: Secondary | ICD-10-CM | POA: Diagnosis not present

## 2016-08-26 MED ORDER — OLANZAPINE 20 MG PO TABS: 20.0000 mg | ORAL_TABLET | Freq: Every day | ORAL | 0 refills | Status: DC

## 2016-08-26 MED ORDER — SERTRALINE HCL 100 MG PO TABS: 150.0000 mg | ORAL_TABLET | Freq: Every day | ORAL | 0 refills | Status: DC

## 2016-08-26 MED ORDER — LAMOTRIGINE 150 MG PO TABS: 150.0000 mg | ORAL_TABLET | Freq: Two times a day (BID) | ORAL | 0 refills | Status: DC

## 2016-08-26 NOTE — Progress Notes (Signed)
[name]  08/26/2016 9:15 AM Timothy Burnett  MRN:  [id number]  Chief Complaint:  Chief Complaint    Follow-up     Subjective:  I am disappointed because my children do not contact with me.  HPI: Timothy Burnett came for his follow-up appointment.  He is taking his medication but lately he has noticed some time irritability and frustration and sadness.  He is disappointed because his children does not respond back when he tried to contact them.  He continues to working 2 jobs and liked to keep himself busy.  He is hoping to stop working at [employer] next year when he does not have to pay child support.  Though he denies any suicidal thoughts or homicidal thought but endorsed some time isolated, withdrawn and sleeping too much.  He denies any feeling of hopelessness or worthlessness.  He is also trying to lose weight and started regular exercise.  He has no tremors, shakes, EPS or any rash.  His energy level is good.  Patient denies drinking alcohol or using any illegal substances.  He lives by himself and currently not in any relationship.  Visit Diagnosis:    ICD-9-CM ICD-10-CM   1. Bipolar 1 disorder (HCC) 296.7 F31.9 sertraline (ZOLOFT) 100 MG tablet     lamoTRIgine (LAMICTAL) 150 MG tablet     OLANZapine (ZYPREXA) 20 MG tablet    Past Psychiatric History: Reviewed. In the past he had tried Abilify and Geodon but stopped due to limited efficacy.  Patient has a history of mania and impulsive behavior.  Past Medical History:  Past Medical History:  Diagnosis Date  . Bipolar disorder ([place])   . Depression   . Hyperlipemia   . Ulcer    stomach    Past Surgical History:  Procedure Laterality Date  . feet surgery     bone spurs  . FOOT SURGERY  2012   Bil  . TONSILLECTOMY      Family Psychiatric History: Reviewed.  Family History:  Family History  Problem Relation Age of Onset  . Hyperlipidemia Mother   . Heart disease Mother   . Hypertension Mother   .  Hyperlipidemia Father     Social History:  Social History   Social History  . Marital status: Married    Spouse name: N/A  . Number of children: N/A  . Years of education: N/A   Social History Main Topics  . Smoking status: Never Smoker  . Smokeless tobacco: Never Used  . Alcohol use No     Comment: Occasional   . Drug use: No  . Sexual activity: Not Currently   Other Topics Concern  . None   Social History Narrative  . None    Allergies:  Allergies  Allergen Reactions  . Penicillins Rash    Metabolic Disorder Labs: Lab Results  Component Value Date   HGBA1C 5.5 08/08/2011   MPG 111 08/08/2011   No results found for: PROLACTIN Lab Results  Component Value Date   CHOL 209 (H) 04/03/2016   TRIG 296 (H) 04/03/2016   HDL 30 (L) 04/03/2016   CHOLHDL 7.0 (H) 04/03/2016   VLDL 59 (H) 04/03/2016   LDLCALC 120 (H) 04/03/2016   LDLCALC 110 (H) 07/04/2015     Current Medications: Current Outpatient Prescriptions  Medication Sig Dispense Refill  . atorvastatin (LIPITOR) 40 MG tablet TAKE ONE TABLET BY MOUTH AT BEDTIME 90 tablet 1  . fenofibrate 160 MG tablet Take 1 tablet (160 mg total)  by mouth daily. 90 tablet 1  . lamoTRIgine (LAMICTAL) 150 MG tablet Take 1 tablet (150 mg total) by mouth 2 (two) times daily. 180 tablet 0  . LORazepam (ATIVAN) 0.5 MG tablet Take 1 tablet (0.5 mg total) by mouth daily as needed for anxiety. 30 tablet 2  . Multiple Vitamin (MULTIVITAMIN) tablet Take 1 tablet by mouth daily.    Marland Kitchen OLANZapine (ZYPREXA) 20 MG tablet Take 1 tablet (20 mg total) by mouth at bedtime. 90 tablet 0  . sertraline (ZOLOFT) 100 MG tablet Take 1.5 tablets (150 mg total) by mouth daily. 135 tablet 0   No current facility-administered medications for this visit.     Neurologic: Headache: No Seizure: No Paresthesias: No  Musculoskeletal: Strength & Muscle Tone: within normal limits Gait & Station: normal Patient leans: N/A  Psychiatric Specialty  Exam: Review of Systems  Constitutional: Negative.   HENT: Negative.   Cardiovascular: Negative.   Musculoskeletal: Negative.   Skin: Negative.  Negative for itching and rash.  Neurological: Negative.     Blood pressure 136/80, pulse 81, height 5\' 8"  (1.727 m), weight 185 lb 6.4 oz (84.1 kg).Body mass index is 28.19 kg/m.  General Appearance: Casual  Eye Contact:  Good  Speech:  Slow  Volume:  Decreased  Mood:  Anxious  Affect:  Congruent  Thought Process:  Goal Directed  Orientation:  Full (Time, Place, and Person)  Thought Content: Logical and Rumination   Suicidal Thoughts:  No  Homicidal Thoughts:  No  Memory:  Immediate;   Good Recent;   Good Remote;   Good  Judgement:  Good  Insight:  Good  Psychomotor Activity:  Normal  Concentration:  Concentration: Good and Attention Span: Good  Recall:  Good  Fund of Knowledge: Good  Language: Good  Akathisia:  No  Handed:  Right  AIMS (if indicated):  0  Assets:  Communication Skills Desire for Improvement Housing Talents/Skills Transportation Vocational/Educational  ADL's:  Intact  Cognition: WNL  Sleep:  Too much    Assessment: Bipolar disorder type I.  Plan: Patient endorse times irritability and frustration because he is disappointed from his children who has not contact him.  I recommended to try Zoloft 150 mg to help irritability.  He has not taken Ativan for a while and does not need a new prescription.  I will continue Zyprexa 20 mg daily and Lamictal 300 mg daily.  Discussed medication side effects and benefits.  I recommended if symptoms do not improve or if he feel worsening of the symptoms that he should call us immediately.  Discuss safety concern that anytime having active suicidal thoughts or homicidal thought.  He to call 911 on the local emergency room.  Follow-up in 3 months.  Harley Mccartney T., MD 08/26/2016, 9:15 AM

## 2016-09-22 ENCOUNTER — Ambulatory Visit (INDEPENDENT_AMBULATORY_CARE_PROVIDER_SITE_OTHER): Payer: BLUE CROSS/BLUE SHIELD | Admitting: Family Medicine

## 2016-09-22 ENCOUNTER — Encounter: Payer: Self-pay | Admitting: Family Medicine

## 2016-09-22 VITALS — BP 124/80 | HR 83 | Temp 98.5°F | Resp 16 | Ht 68.0 in | Wt 177.4 lb

## 2016-09-22 DIAGNOSIS — E785 Hyperlipidemia, unspecified: Secondary | ICD-10-CM

## 2016-09-22 LAB — LIPID PANEL
Cholesterol: 191 mg/dL (ref 0–200)
HDL: 27.1 mg/dL — ABNORMAL LOW
NonHDL: 164.04
Total CHOL/HDL Ratio: 7
Triglycerides: 208 mg/dL — ABNORMAL HIGH (ref 0.0–149.0)
VLDL: 41.6 mg/dL — ABNORMAL HIGH (ref 0.0–40.0)

## 2016-09-22 LAB — BASIC METABOLIC PANEL
BUN: 13 mg/dL (ref 6–23)
CALCIUM: 9.6 mg/dL (ref 8.4–10.5)
CO2: 27 meq/L (ref 19–32)
CREATININE: 1.16 mg/dL (ref 0.40–1.50)
Chloride: 107 mEq/L (ref 96–112)
GFR: 70.34 mL/min (ref >60.00)
GLUCOSE: 92 mg/dL (ref 70–99)
Potassium: 4.3 mEq/L (ref 3.5–5.1)
Sodium: 139 mEq/L (ref 135–145)

## 2016-09-22 LAB — HEPATIC FUNCTION PANEL
ALBUMIN: 4.3 g/dL (ref 3.5–5.2)
ALK PHOS: 47 U/L (ref 39–117)
ALT: 24 U/L (ref 0–53)
AST: 18 U/L (ref 0–37)
Bilirubin, Direct: 0.1 mg/dL (ref 0.0–0.3)
TOTAL PROTEIN: 6.5 g/dL (ref 6.0–8.3)
Total Bilirubin: 0.3 mg/dL (ref 0.2–1.2)

## 2016-09-22 LAB — LDL CHOLESTEROL, DIRECT: LDL DIRECT: 133 mg/dL

## 2016-09-22 NOTE — Progress Notes (Signed)
   Subjective:    Patient ID: Timothy Burnett, male    DOB: 10/12/1965, 51 y.o.   MRN: [id number]  HPI Hyperlipidemia- chronic problem.  On Lipitor and Fenofibrate daily.  Pt has lost 8 lbs in last month.  He states he is exercising regularly.  He is on Lamictal and Zyprexa which place him at risk for higher triglycerides.  No CP, SOB, HAs, visual changes, abd pain, N/V.     Review of Systems For ROS see HPI     Objective:   Physical Exam  Constitutional: He is oriented to person, place, and time. He appears well-developed and well-nourished. No distress.  HENT:  Head: Normocephalic and atraumatic.  Eyes: Conjunctivae and EOM are normal. Pupils are equal, round, and reactive to light.  Neck: Normal range of motion. Neck supple. No thyromegaly present.  Cardiovascular: Normal rate, regular rhythm, normal heart sounds and intact distal pulses.   No murmur heard. Pulmonary/Chest: Effort normal and breath sounds normal. No respiratory distress.  Abdominal: Soft. Bowel sounds are normal. He exhibits no distension.  Musculoskeletal: He exhibits no edema.  Lymphadenopathy:    He has no cervical adenopathy.  Neurological: He is alert and oriented to person, place, and time. No cranial nerve deficit.  Skin: Skin is warm and dry.  Psychiatric: He has a normal mood and affect. His behavior is normal.  Vitals reviewed.         Assessment & Plan:

## 2016-09-22 NOTE — Progress Notes (Signed)
Pre visit review using our clinic review tool, if applicable. No additional management support is needed unless otherwise documented below in the visit note. 

## 2016-09-22 NOTE — Patient Instructions (Signed)
Schedule your complete physical in 6 months We'll notify you of your lab results and make any changes if needed Keep up the good work on healthy diet and regular exercise- you look great! Call with any questions or concerns Enjoy the beach!!!

## 2016-09-22 NOTE — Assessment & Plan Note (Signed)
Chronic problem.  Applauded pt's weight loss efforts.  Tolerating statin w/o difficulty.  Check labs.  Adjust meds prn

## 2016-09-23 ENCOUNTER — Encounter: Payer: Self-pay | Admitting: Family Medicine

## 2016-09-23 ENCOUNTER — Encounter: Payer: Self-pay | Admitting: General Practice

## 2016-10-02 ENCOUNTER — Other Ambulatory Visit: Payer: Self-pay | Admitting: General Practice

## 2016-10-02 ENCOUNTER — Encounter: Payer: Self-pay | Admitting: Family Medicine

## 2016-10-02 MED ORDER — ATORVASTATIN CALCIUM 40 MG PO TABS: ORAL_TABLET | ORAL | 1 refills | Status: DC

## 2016-10-02 MED ORDER — FENOFIBRATE 160 MG PO TABS: 160.0000 mg | ORAL_TABLET | Freq: Every day | ORAL | 1 refills | Status: DC

## 2016-11-26 ENCOUNTER — Ambulatory Visit (HOSPITAL_COMMUNITY): Payer: BLUE CROSS/BLUE SHIELD | Admitting: Psychiatry

## 2016-11-27 ENCOUNTER — Encounter (HOSPITAL_COMMUNITY): Payer: Self-pay | Admitting: Psychiatry

## 2016-11-27 ENCOUNTER — Ambulatory Visit (INDEPENDENT_AMBULATORY_CARE_PROVIDER_SITE_OTHER): Payer: BLUE CROSS/BLUE SHIELD | Admitting: Psychiatry

## 2016-11-27 DIAGNOSIS — F319 Bipolar disorder, unspecified: Secondary | ICD-10-CM | POA: Diagnosis not present

## 2016-11-27 DIAGNOSIS — G4700 Insomnia, unspecified: Secondary | ICD-10-CM | POA: Diagnosis not present

## 2016-11-27 DIAGNOSIS — Z566 Other physical and mental strain related to work: Secondary | ICD-10-CM | POA: Diagnosis not present

## 2016-11-27 DIAGNOSIS — Z638 Other specified problems related to primary support group: Secondary | ICD-10-CM

## 2016-11-27 MED ORDER — SERTRALINE HCL 100 MG PO TABS: 150.0000 mg | ORAL_TABLET | Freq: Every day | ORAL | 0 refills | Status: DC

## 2016-11-27 MED ORDER — LAMOTRIGINE 150 MG PO TABS: 150.0000 mg | ORAL_TABLET | Freq: Two times a day (BID) | ORAL | 0 refills | Status: DC

## 2016-11-27 MED ORDER — OLANZAPINE 20 MG PO TABS: 20.0000 mg | ORAL_TABLET | Freq: Every day | ORAL | 0 refills | Status: DC

## 2016-11-27 MED ORDER — HYDROXYZINE PAMOATE 25 MG PO CAPS: ORAL_CAPSULE | ORAL | 0 refills | Status: DC

## 2016-11-27 NOTE — Progress Notes (Signed)
[facility] MD/PA/NP OP Progress Note  11/27/2016 8:20 AM Timothy Burnett  MRN:  [id number]  Chief Complaint: I still have irritability and sometimes I don't sleep well. Chief Complaint    Follow-up     HPI: Timothy Burnett came for his follow-up appointment.  On his last visit we increased Zoloft to help his irritability and frustration but he did not see a huge improvement.  He continues to have racing thoughts poor sleep irritability and overwhelming distress.  Most of the stress coming from work.  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  He is also working at [employer] 12 hours a week .  He trying to go to gym whenever he had time but lately has been very tired and noticed more withdrawn, isolated and some time irritable.  Though he denies any suicidal thoughts or homicidal thoughts but completing a poor sleep and racing thoughts.  He is also very disappointed because children did not contact him.  He is taking Lamictal Zoloft and some time Ativan.  He is not drinking or using any illegal substances.  He lives by himself.  Currently he is not in any relationship.  Recently he seen his primary care physician Dr. Birdie Riddle and had blood work and physical.  His lipid panel improved from the past.  His appetite is okay.  His energy level is fair.  He denies any hallucination, paranoia or any aggressive behavior.    Visit Diagnosis:    ICD-10-CM   1. Bipolar 1 disorder (HCC) F31.9 sertraline (ZOLOFT) 100 MG tablet    OLANZapine (ZYPREXA) 20 MG tablet    lamoTRIgine (LAMICTAL) 150 MG tablet    hydrOXYzine (VISTARIL) 25 MG capsule    Past Psychiatric History: Reviewed. Patient has history of mania and impulsive behavior.  In the past he tried Abilify and Geodon but did not help him.  He was taking Ativan 0.5 mg which was discontinued when Vistaril was added.  Past Medical History:  Past Medical History:  Diagnosis Date  . Bipolar disorder ([place])   . Depression    . Hyperlipemia   . Ulcer    stomach    Past Surgical History:  Procedure Laterality Date  . feet surgery     bone spurs  . FOOT SURGERY  2012   Bil  . TONSILLECTOMY      Family Psychiatric History: Reviewed.  Family History:  Family History  Problem Relation Age of Onset  . Hyperlipidemia Mother   . Heart disease Mother   . Hypertension Mother   . Hyperlipidemia Father     Social History:  Social History   Social History  . Marital status: Married    Spouse name: N/A  . Number of children: N/A  . Years of education: N/A   Social History Main Topics  . Smoking status: Never Smoker  . Smokeless tobacco: Never Used  . Alcohol use No     Comment: Occasional   . Drug use: No  . Sexual activity: Not Currently   Other Topics Concern  . None   Social History Narrative  . None    Allergies:  Allergies  Allergen Reactions  . Penicillins Rash    Metabolic Disorder Labs: Recent Results (from the past 2160 hour(s))  Lipid panel     Status: Abnormal   Collection Time: 09/22/16 10:37 AM  Result Value Ref Range   Cholesterol 191 0 - 200 mg/dL    Comment: ATP III  Classification       Desirable:  < 200 mg/dL               Borderline High:  200 - 239 mg/dL          High:  > = 240 mg/dL   Triglycerides 208.0 (H) 0.0 - 149.0 mg/dL    Comment: Normal:  <150 mg/dLBorderline High:  150 - 199 mg/dL   HDL 27.10 (L) >39.00 mg/dL   VLDL 41.6 (H) 0.0 - 40.0 mg/dL   Total CHOL/HDL Ratio 7     Comment:                Men          Women1/2 Average Risk     3.4          3.3Average Risk          5.0          4.42X Average Risk          9.6          7.13X Average Risk          15.0          11.0                       NonHDL 164.04     Comment: NOTE:  Non-HDL goal should be 30 mg/dL higher than patient's LDL goal (i.e. LDL goal of < 70 mg/dL, would have non-HDL goal of < 100 mg/dL)  Basic metabolic panel     Status: None   Collection Time: 09/22/16 10:37 AM  Result Value Ref  Range   Sodium 139 135 - 145 mEq/L   Potassium 4.3 3.5 - 5.1 mEq/L   Chloride 107 96 - 112 mEq/L   CO2 27 19 - 32 mEq/L   Glucose, Bld 92 70 - 99 mg/dL   BUN 13 6 - 23 mg/dL   Creatinine, Ser 1.16 0.40 - 1.50 mg/dL   Calcium 9.6 8.4 - 10.5 mg/dL   GFR 70.34 >60.00 mL/min  Hepatic function panel     Status: None   Collection Time: 09/22/16 10:37 AM  Result Value Ref Range   Total Bilirubin 0.3 0.2 - 1.2 mg/dL   Bilirubin, Direct 0.1 0.0 - 0.3 mg/dL   Alkaline Phosphatase 47 39 - 117 U/L   AST 18 0 - 37 U/L   ALT 24 0 - 53 U/L   Total Protein 6.5 6.0 - 8.3 g/dL   Albumin 4.3 3.5 - 5.2 g/dL  LDL cholesterol, direct     Status: None   Collection Time: 09/22/16 10:37 AM  Result Value Ref Range   Direct LDL 133.0 mg/dL    Comment: Optimal:  <100 mg/dLNear or Above Optimal:  100-129 mg/dLBorderline High:  130-159 mg/dLHigh:  160-189 mg/dLVery High:  >190 mg/dL   Lab Results  Component Value Date   HGBA1C 5.5 08/08/2011   MPG 111 08/08/2011   No results found for: PROLACTIN Lab Results  Component Value Date   CHOL 191 09/22/2016   TRIG 208.0 (H) 09/22/2016   HDL 27.10 (L) 09/22/2016   CHOLHDL 7 09/22/2016   VLDL 41.6 (H) 09/22/2016   LDLCALC 120 (H) 04/03/2016   LDLCALC 110 (H) 07/04/2015   Lab Results  Component Value Date   TSH 1.84 04/03/2016   TSH 1.75 01/03/2015    Therapeutic Level Labs: No results found for: LITHIUM No results found for: VALPROATE No components  found for:  CBMZ  Current Medications: Current Outpatient Prescriptions  Medication Sig Dispense Refill  . atorvastatin (LIPITOR) 40 MG tablet TAKE ONE TABLET BY MOUTH AT BEDTIME 90 tablet 1  . B Complex Vitamins (B COMPLEX PO) Take by mouth.    . fenofibrate 160 MG tablet Take 1 tablet (160 mg total) by mouth daily. 90 tablet 1  . lamoTRIgine (LAMICTAL) 150 MG tablet Take 1 tablet (150 mg total) by mouth 2 (two) times daily. 180 tablet 0  . LORazepam (ATIVAN) 0.5 MG tablet Take 1 tablet (0.5 mg  total) by mouth daily as needed for anxiety. 30 tablet 2  . Multiple Vitamin (MULTIVITAMIN) tablet Take 1 tablet by mouth daily.    Marland Kitchen OLANZapine (ZYPREXA) 20 MG tablet Take 1 tablet (20 mg total) by mouth at bedtime. 90 tablet 0  . Omega-3 Fatty Acids (FISH OIL PO) Take by mouth.    . sertraline (ZOLOFT) 100 MG tablet Take 1.5 tablets (150 mg total) by mouth daily. 135 tablet 0   No current facility-administered medications for this visit.      Musculoskeletal: Strength & Muscle Tone: within normal limits Gait & Station: normal Patient leans: N/A  Psychiatric Specialty Exam: Review of Systems  Constitutional: Negative.   HENT: Negative.   Respiratory: Negative.   Cardiovascular: Negative.   Gastrointestinal: Negative.   Genitourinary: Negative.   Musculoskeletal: Negative.   Skin: Negative.  Negative for itching and rash.  Neurological: Negative.   Psychiatric/Behavioral: The patient has insomnia.     Blood pressure 122/76, pulse 87, height 5\' 8"  (1.727 m), weight 176 lb (79.8 kg).Body mass index is 26.76 kg/m.  General Appearance: Casual  Eye Contact:  Good  Speech:  Slow  Volume:  Normal  Mood:  Anxious  Affect:  Appropriate  Thought Process:  Goal Directed  Orientation:  Full (Time, Place, and Person)  Thought Content: Illogical and Rumination   Suicidal Thoughts:  No  Homicidal Thoughts:  No  Memory:  Immediate;   Good Recent;   Good Remote;   Good  Judgement:  Good  Insight:  Good  Psychomotor Activity:  Normal  Concentration:  Concentration: Good and Attention Span: Good  Recall:  Good  Fund of Knowledge: Good  Language: Good  Akathisia:  No  Handed:  Right  AIMS (if indicated): not done  Assets:  Communication Skills Desire for Improvement Housing Physical Health Resilience  ADL's:  Intact  Cognition: WNL  Sleep:  Fair   Screenings: PHQ2-9     Office Visit from 04/03/2016 in [facility] Office Visit from  10/19/2015 in [facility] Office Visit from 01/03/2015 in [facility] at [facility]  PHQ-2 Total Score  0  0  0  PHQ-9 Total Score  0  -  -      Assessment: Bipolar disorder type I.  Plan: I reviewed records and collateral information from other providers including recent blood work results.  His lipid panel is improved from the past.  He continues to have irritability and racing thoughts.  Despite increase Zoloft he did not see much improvement.  I recommended to take Vistaril 25 mg 1-2 capsule at bedtime on a regular basis to help irritability and sleep.  We will discontinue Ativan.  Continue Zyprexa 20 mg daily, Lamictal 300 mg daily and Zoloft 150 mg daily.  Patient has no rash, itching, tremors or shakes.  Patient is not interested in counseling.  Recommended to  call us back if he has any question or any concern.  Follow-up in 3 months.  Time spent 25 minutes.  Aastha Dayley T., MD 11/27/2016, 8:20 AM

## 2017-01-17 ENCOUNTER — Other Ambulatory Visit (HOSPITAL_COMMUNITY): Payer: Self-pay | Admitting: Psychiatry

## 2017-01-17 DIAGNOSIS — F319 Bipolar disorder, unspecified: Secondary | ICD-10-CM

## 2017-01-21 ENCOUNTER — Other Ambulatory Visit (HOSPITAL_COMMUNITY): Payer: Self-pay

## 2017-01-21 DIAGNOSIS — F319 Bipolar disorder, unspecified: Secondary | ICD-10-CM

## 2017-01-21 MED ORDER — HYDROXYZINE PAMOATE 25 MG PO CAPS: ORAL_CAPSULE | ORAL | 0 refills | Status: DC

## 2017-01-22 ENCOUNTER — Other Ambulatory Visit (HOSPITAL_COMMUNITY): Payer: Self-pay

## 2017-01-22 DIAGNOSIS — F319 Bipolar disorder, unspecified: Secondary | ICD-10-CM

## 2017-01-22 MED ORDER — HYDROXYZINE PAMOATE 25 MG PO CAPS: ORAL_CAPSULE | ORAL | 0 refills | Status: DC

## 2017-01-30 ENCOUNTER — Other Ambulatory Visit (HOSPITAL_COMMUNITY): Payer: Self-pay | Admitting: Psychiatry

## 2017-02-27 ENCOUNTER — Encounter (HOSPITAL_COMMUNITY): Payer: Self-pay | Admitting: Psychiatry

## 2017-02-27 ENCOUNTER — Ambulatory Visit (INDEPENDENT_AMBULATORY_CARE_PROVIDER_SITE_OTHER): Payer: BLUE CROSS/BLUE SHIELD | Admitting: Psychiatry

## 2017-02-27 VITALS — BP 138/80 | HR 82 | Ht 68.0 in | Wt 178.0 lb

## 2017-02-27 DIAGNOSIS — Z79899 Other long term (current) drug therapy: Secondary | ICD-10-CM | POA: Diagnosis not present

## 2017-02-27 DIAGNOSIS — F419 Anxiety disorder, unspecified: Secondary | ICD-10-CM | POA: Diagnosis not present

## 2017-02-27 DIAGNOSIS — F319 Bipolar disorder, unspecified: Secondary | ICD-10-CM | POA: Diagnosis not present

## 2017-02-27 MED ORDER — SERTRALINE HCL 100 MG PO TABS: 150.0000 mg | ORAL_TABLET | Freq: Every day | ORAL | 0 refills | Status: DC

## 2017-02-27 MED ORDER — LORAZEPAM 0.5 MG PO TABS
0.5000 mg | ORAL_TABLET | Freq: Every day | ORAL | 0 refills | Status: DC | PRN
Start: 2017-02-27 — End: 2017-05-28

## 2017-02-27 MED ORDER — OLANZAPINE 20 MG PO TABS: 20.0000 mg | ORAL_TABLET | Freq: Every day | ORAL | 0 refills | Status: DC

## 2017-02-27 MED ORDER — HYDROXYZINE PAMOATE 25 MG PO CAPS: ORAL_CAPSULE | ORAL | 0 refills | Status: DC

## 2017-02-27 MED ORDER — LAMOTRIGINE 150 MG PO TABS: 150.0000 mg | ORAL_TABLET | Freq: Two times a day (BID) | ORAL | 0 refills | Status: DC

## 2017-02-27 NOTE — Progress Notes (Signed)
[facility] MD/PA/NP OP Progress Note  02/27/2017 9:06 AM Timothy Burnett  MRN:  [id number]  Chief Complaint: I am doing fine but holidays making me anxious and stressed.  Sometimes I have nervousness.  HPI: Patient came for his follow-up appointment.  On his last visit we added Vistaril to help his sleep and is sleeping better but he continues to feel nervous and anxious during the day.  He is working at [employer] and it is a very busy season.  He has been working very late and some time he has a lot of stress.  Patient admitted during the holiday season he will be remain very busy.  Like to resume his low-dose lorazepam to help his anxiety and nervousness.  He admitted not going to gym because he has no time.  He works also at [employer] 12 hours a week.  He lives by himself.  He is not drinking or using any illegal substances.  He is sad that his children did not contact him on the holidays.  He has a children from his first marriage who lives in [place].  Patient is compliant with Lamictal, Zoloft.  He denies any tremors shakes or any EPS.  With the help of Vistaril he is sleeping better.  He has no concerns from the medication.  Denies any agitation, anger, mania or any psychosis.  His energy level is okay.  His vital signs are stable.  Visit Diagnosis:    ICD-10-CM   1. Bipolar 1 disorder (HCC) F31.9 sertraline (ZOLOFT) 100 MG tablet    OLANZapine (ZYPREXA) 20 MG tablet    lamoTRIgine (LAMICTAL) 150 MG tablet    hydrOXYzine (VISTARIL) 25 MG capsule  2. Anxiety F41.9 LORazepam (ATIVAN) 0.5 MG tablet    Past Psychiatric History: Reviewed. Patient has history of mania and impulsive behavior.  In the past he tried Abilify and Geodon but did not help him.    Past Medical History:  Past Medical History:  Diagnosis Date  . Bipolar disorder ([place])   . Depression   . Hyperlipemia   . Ulcer    stomach    Past Surgical History:  Procedure Laterality Date  . feet surgery     bone spurs  . FOOT SURGERY   2012   Bil  . TONSILLECTOMY      Family Psychiatric History: Reviewed.  Family History:  Family History  Problem Relation Age of Onset  . Hyperlipidemia Mother   . Heart disease Mother   . Hypertension Mother   . Hyperlipidemia Father     Social History:  Social History   Socioeconomic History  . Marital status: Married    Spouse name: Not on file  . Number of children: Not on file  . Years of education: Not on file  . Highest education level: Not on file  Social Needs  . Financial resource strain: Not on file  . Food insecurity - worry: Not on file  . Food insecurity - inability: Not on file  . Transportation needs - medical: Not on file  . Transportation needs - non-medical: Not on file  Occupational History  . Not on file  Tobacco Use  . Smoking status: Never Smoker  . Smokeless tobacco: Never Used  Substance and Sexual Activity  . Alcohol use: No    Alcohol/week: 0.0 oz    Comment: Occasional   . Drug use: No  . Sexual activity: Not Currently  Other Topics Concern  . Not on file  Social History Narrative  .  Not on file    Allergies:  Allergies  Allergen Reactions  . Penicillins Rash    Metabolic Disorder Labs: Lab Results  Component Value Date   HGBA1C 5.5 08/08/2011   MPG 111 08/08/2011   No results found for: PROLACTIN Lab Results  Component Value Date   CHOL 191 09/22/2016   TRIG 208.0 (H) 09/22/2016   HDL 27.10 (L) 09/22/2016   CHOLHDL 7 09/22/2016   VLDL 41.6 (H) 09/22/2016   LDLCALC 120 (H) 04/03/2016   LDLCALC 110 (H) 07/04/2015   Lab Results  Component Value Date   TSH 1.84 04/03/2016   TSH 1.75 01/03/2015    Therapeutic Level Labs: No results found for: LITHIUM No results found for: VALPROATE No components found for:  CBMZ  Current Medications: Current Outpatient Medications  Medication Sig Dispense Refill  . atorvastatin (LIPITOR) 40 MG tablet TAKE ONE TABLET BY MOUTH AT BEDTIME 90 tablet 1  . B Complex Vitamins (B  COMPLEX PO) Take by mouth.    . fenofibrate 160 MG tablet Take 1 tablet (160 mg total) by mouth daily. 90 tablet 1  . hydrOXYzine (VISTARIL) 25 MG capsule Take 1-2 capsule at be dtime 45 capsule 0  . lamoTRIgine (LAMICTAL) 150 MG tablet Take 1 tablet (150 mg total) by mouth 2 (two) times daily. 180 tablet 0  . Multiple Vitamin (MULTIVITAMIN) tablet Take 1 tablet by mouth daily.    Marland Kitchen OLANZapine (ZYPREXA) 20 MG tablet Take 1 tablet (20 mg total) by mouth at bedtime. 90 tablet 0  . Omega-3 Fatty Acids (FISH OIL PO) Take by mouth.    . sertraline (ZOLOFT) 100 MG tablet Take 1.5 tablets (150 mg total) by mouth daily. 135 tablet 0   No current facility-administered medications for this visit.      Musculoskeletal: Strength & Muscle Tone: within normal limits Gait & Station: normal Patient leans: N/A  Psychiatric Specialty Exam: Review of Systems  Constitutional: Negative.   Respiratory: Negative.   Musculoskeletal: Negative.   Skin: Negative.  Negative for itching and rash.  Neurological: Negative.   Psychiatric/Behavioral: The patient is nervous/anxious.     Blood pressure 138/80, pulse 82, height 5\' 8"  (1.727 m), weight 178 lb (80.7 kg).There is no height or weight on file to calculate BMI.  General Appearance: Well Groomed  Eye Contact:  Good  Speech:  Clear and Coherent  Volume:  Normal  Mood:  Anxious  Affect:  Appropriate  Thought Process:  Goal Directed  Orientation:  Full (Time, Place, and Person)  Thought Content: Logical   Suicidal Thoughts:  No  Homicidal Thoughts:  No  Memory:  Immediate;   Good Recent;   Good Remote;   Good  Judgement:  Good  Insight:  Good  Psychomotor Activity:  Normal  Concentration:  Concentration: Good and Attention Span: Good  Recall:  Good  Fund of Knowledge: Good  Language: Good  Akathisia:  No  Handed:  Left  AIMS (if indicated): not done  Assets:  Communication Skills Desire for [place] Talents/Skills Transportation  ADL's:  Intact  Cognition: WNL  Sleep:  Good   Screenings: PHQ2-9     Office Visit from 04/03/2016 in [facility] Office Visit from 10/19/2015 in [facility] Office Visit from 01/03/2015 in [facility] at [facility]  PHQ-2 Total Score  0  0  0  PHQ-9 Total Score  0  No data  No data  Assessment and Plan: Bipolar disorder type I.  Anxiety.  Patient doing better since we added low-dose Vistaril and is sleeping better.  However he still have anxiety and nervousness.  He like to resume Ativan which he has taken in the past with good response.  He like to take the Ativan until holiday seasons because his job is very stressful.  He has no rash, itching, tremors or shakes.  Continue Vistaril 25 mg 1-2 capsules at bedtime, Zyprexa 20 mg at bedtime, Lamictal 300 mg daily, Zoloft 150 mg daily and we will add low-dose lorazepam 0.5 mg for severe anxiety and nervousness.  Will provide 20 tablets.  Discussed medication side effects and benefits especially benzodiazepine dependence tolerance and withdrawal.  Patient is not interested in counseling.  Recommended to call us back if he has any question, concern if he feels worsening of the symptoms.  Follow-up in 3 months.   Kathlee Nations, MD 02/27/2017, 9:06 AM

## 2017-04-06 ENCOUNTER — Encounter: Payer: Self-pay | Admitting: Family Medicine

## 2017-04-06 ENCOUNTER — Other Ambulatory Visit: Payer: Self-pay

## 2017-04-06 ENCOUNTER — Encounter: Payer: Self-pay | Admitting: General Practice

## 2017-04-06 ENCOUNTER — Ambulatory Visit (INDEPENDENT_AMBULATORY_CARE_PROVIDER_SITE_OTHER): Payer: BLUE CROSS/BLUE SHIELD | Admitting: Family Medicine

## 2017-04-06 VITALS — BP 128/78 | HR 79 | Temp 98.3°F | Resp 16 | Ht 68.0 in | Wt 175.4 lb

## 2017-04-06 DIAGNOSIS — Z0000 Encounter for general adult medical examination without abnormal findings: Secondary | ICD-10-CM

## 2017-04-06 DIAGNOSIS — Z125 Encounter for screening for malignant neoplasm of prostate: Secondary | ICD-10-CM

## 2017-04-06 DIAGNOSIS — E785 Hyperlipidemia, unspecified: Secondary | ICD-10-CM | POA: Diagnosis not present

## 2017-04-06 LAB — CBC WITH DIFFERENTIAL/PLATELET
BASOS ABS: 0 10*3/uL (ref 0.0–0.1)
Basophils Relative: 0.2 % (ref 0.0–3.0)
EOS ABS: 0.1 10*3/uL (ref 0.0–0.7)
Eosinophils Relative: 1.7 % (ref 0.0–5.0)
HCT: 41.7 % (ref 39.0–52.0)
Hemoglobin: 13.8 g/dL (ref 13.0–17.0)
LYMPHS PCT: 24.9 % (ref 12.0–46.0)
Lymphs Abs: 2.1 10*3/uL (ref 0.7–4.0)
MCHC: 33 g/dL (ref 30.0–36.0)
MCV: 93.7 fl (ref 78.0–100.0)
Monocytes Absolute: 0.5 10*3/uL (ref 0.1–1.0)
Monocytes Relative: 6.2 % (ref 3.0–12.0)
NEUTROS ABS: 5.5 10*3/uL (ref 1.4–7.7)
NEUTROS PCT: 67 % (ref 43.0–77.0)
PLATELETS: 267 10*3/uL (ref 150.0–400.0)
RBC: 4.46 Mil/uL (ref 4.22–5.81)
RDW: 14.8 % (ref 11.5–15.5)
WBC: 8.2 10*3/uL (ref 4.0–10.5)

## 2017-04-06 LAB — TSH: TSH: 3.05 u[IU]/mL (ref 0.35–4.50)

## 2017-04-06 LAB — LIPID PANEL
CHOL/HDL RATIO: 6
CHOLESTEROL: 205 mg/dL — AB (ref 0–200)
HDL: 32.5 mg/dL — ABNORMAL LOW (ref >39.00)
NonHDL: 172.81
Triglycerides: 255 mg/dL — ABNORMAL HIGH (ref 0.0–149.0)
VLDL: 51 mg/dL — AB (ref 0.0–40.0)

## 2017-04-06 LAB — LDL CHOLESTEROL, DIRECT: LDL DIRECT: 139 mg/dL

## 2017-04-06 LAB — BASIC METABOLIC PANEL
BUN: 13 mg/dL (ref 6–23)
CHLORIDE: 104 meq/L (ref 96–112)
CO2: 28 mEq/L (ref 19–32)
Calcium: 9.5 mg/dL (ref 8.4–10.5)
Creatinine, Ser: 1.01 mg/dL (ref 0.40–1.50)
GFR: 82.35 mL/min (ref >60.00)
GLUCOSE: 91 mg/dL (ref 70–99)
POTASSIUM: 4.3 meq/L (ref 3.5–5.1)
SODIUM: 139 meq/L (ref 135–145)

## 2017-04-06 LAB — HEPATIC FUNCTION PANEL
ALT: 29 U/L (ref 0–53)
AST: 17 U/L (ref 0–37)
Albumin: 4.4 g/dL (ref 3.5–5.2)
Alkaline Phosphatase: 76 U/L (ref 39–117)
Bilirubin, Direct: 0.1 mg/dL (ref 0.0–0.3)
TOTAL PROTEIN: 6.6 g/dL (ref 6.0–8.3)
Total Bilirubin: 0.4 mg/dL (ref 0.2–1.2)

## 2017-04-06 LAB — PSA: PSA: 0.71 ng/mL (ref 0.10–4.00)

## 2017-04-06 NOTE — Assessment & Plan Note (Signed)
Pt's PE WNL.  UTD on colonoscopy, immunizations.  Check labs.  Anticipatory guidance provided.  

## 2017-04-06 NOTE — Patient Instructions (Signed)
Follow up in 6 months to recheck cholesterol We'll notify you of your lab results and make any changes if needed Continue to work on healthy diet and regular exercise- you look great!! Call with any questions or concerns Happy New Year!!!

## 2017-04-06 NOTE — Assessment & Plan Note (Signed)
Chronic problem.  Tolerating statin w/o difficulty.  Check labs.  Adjust meds prn  

## 2017-04-06 NOTE — Progress Notes (Signed)
   Subjective:    Patient ID: Timothy Burnett, male    DOB: [birth year], 52 y.o.   MRN: [id number]  CPE- UTD on colonoscopy, Tdap, Flu.     Review of Systems Patient reports no vision/hearing changes, anorexia, fever ,adenopathy, persistant/recurrent hoarseness, swallowing issues, chest pain, palpitations, edema, persistant/recurrent cough, hemoptysis, dyspnea (rest,exertional, paroxysmal nocturnal), gastrointestinal  bleeding (melena, rectal bleeding), abdominal pain, excessive heart burn, GU symptoms (dysuria, hematuria, voiding/incontinence issues) syncope, focal weakness, memory loss, numbness & tingling, skin/hair/nail changes, depression, anxiety, abnormal bruising/bleeding, musculoskeletal symptoms/signs.     Objective:   Physical Exam BP 128/78   Pulse 79   Temp 98.3 F (36.8 C) (Oral)   Resp 16   Ht 5\' 8"  (1.727 m)   Wt 175 lb 6 oz (79.5 kg)   SpO2 98%   BMI 26.67 kg/m   General Appearance:    Alert, cooperative, no distress, appears stated age  Head:    Normocephalic, without obvious abnormality, atraumatic  Eyes:    PERRL, conjunctiva/corneas clear, EOM's intact, fundi    benign, both eyes       Ears:    Normal TM's and external ear canals, both ears  Nose:   Nares normal, septum midline, mucosa normal, no drainage   or sinus tenderness  Throat:   Lips, mucosa, and tongue normal; teeth and gums normal  Neck:   Supple, symmetrical, trachea midline, no adenopathy;       thyroid:  No enlargement/tenderness/nodules  Back:     Symmetric, no curvature, ROM normal, no CVA tenderness  Lungs:     Clear to auscultation bilaterally, respirations unlabored  Chest wall:    No tenderness or deformity  Heart:    Regular rate and rhythm, S1 and S2 normal, no murmur, rub   or gallop  Abdomen:     Soft, non-tender, bowel sounds active all four quadrants,    no masses, no organomegaly  Genitalia:    Normal male without lesion, discharge or tenderness  Rectal:    Normal tone, normal  prostate, no masses or tenderness  Extremities:   Extremities normal, atraumatic, no cyanosis or edema  Pulses:   2+ and symmetric all extremities  Skin:   Skin color, texture, turgor normal, no rashes or lesions  Lymph nodes:   Cervical, supraclavicular, and axillary nodes normal  Neurologic:   CNII-XII intact. Normal strength, sensation and reflexes      throughout          Assessment & Plan:

## 2017-04-07 ENCOUNTER — Encounter: Payer: Self-pay | Admitting: Family Medicine

## 2017-04-09 ENCOUNTER — Other Ambulatory Visit: Payer: Self-pay | Admitting: Family Medicine

## 2017-04-09 ENCOUNTER — Telehealth: Payer: Self-pay | Admitting: Family Medicine

## 2017-04-09 NOTE — Telephone Encounter (Signed)
Copied from [place]. Topic: Quick Communication - See Telephone Encounter >> Apr 09, 2017  6:53 PM Ivar Drape wrote: CRM for notification. See Telephone encounter for:  04/09/17. Patient would like refills on the following prescriptions:  1) Fenofibrate 160mg   2) Atorvastatin 40mg 

## 2017-04-10 ENCOUNTER — Other Ambulatory Visit: Payer: Self-pay | Admitting: *Deleted

## 2017-04-10 MED ORDER — ATORVASTATIN CALCIUM 40 MG PO TABS: ORAL_TABLET | ORAL | 1 refills | Status: DC

## 2017-04-10 MED ORDER — FENOFIBRATE 160 MG PO TABS: 160.0000 mg | ORAL_TABLET | Freq: Every day | ORAL | 1 refills | Status: DC

## 2017-05-28 ENCOUNTER — Ambulatory Visit (INDEPENDENT_AMBULATORY_CARE_PROVIDER_SITE_OTHER): Payer: BLUE CROSS/BLUE SHIELD | Admitting: Psychiatry

## 2017-05-28 ENCOUNTER — Encounter (HOSPITAL_COMMUNITY): Payer: Self-pay | Admitting: Psychiatry

## 2017-05-28 DIAGNOSIS — F410 Panic disorder [episodic paroxysmal anxiety] without agoraphobia: Secondary | ICD-10-CM

## 2017-05-28 DIAGNOSIS — F419 Anxiety disorder, unspecified: Secondary | ICD-10-CM

## 2017-05-28 DIAGNOSIS — F319 Bipolar disorder, unspecified: Secondary | ICD-10-CM

## 2017-05-28 MED ORDER — LAMOTRIGINE 150 MG PO TABS: 150.0000 mg | ORAL_TABLET | Freq: Two times a day (BID) | ORAL | 0 refills | Status: DC

## 2017-05-28 MED ORDER — SERTRALINE HCL 100 MG PO TABS: 150.0000 mg | ORAL_TABLET | Freq: Every day | ORAL | 0 refills | Status: DC

## 2017-05-28 MED ORDER — HYDROXYZINE PAMOATE 25 MG PO CAPS: ORAL_CAPSULE | ORAL | 0 refills | Status: DC

## 2017-05-28 MED ORDER — LORAZEPAM 0.5 MG PO TABS: 0.5000 mg | ORAL_TABLET | Freq: Every day | ORAL | 0 refills | Status: DC | PRN

## 2017-05-28 MED ORDER — OLANZAPINE 20 MG PO TABS: 20.0000 mg | ORAL_TABLET | Freq: Every day | ORAL | 0 refills | Status: DC

## 2017-05-28 NOTE — Progress Notes (Addendum)
[place] MD/PA/NP OP Progress Note  05/28/2017 8:08 AM Timothy Burnett  MRN:  [id number]  Chief Complaint: I am doing good.  I got a promotion.  HPI: Timothy Burnett came for his follow-up appointment.  He is taking his medication as prescribed.  He ran out from Vistaril month ago and he admitted some nights he has difficulty falling asleep.  He is happy because he got a promotion and now he is Freight forwarder at [employer].  He admitted increased stress and feeling overwhelmed but he is doing better.  He is sleeping most of the night good.  He denies any irritability, anger, mania or any psychosis.  He continues to work at [employer] on the weekend but hoping to stop after August when he does not have to pay child support.  Patient also mentioned that his family is going to [place] on a cruise trip in August.  He is excited about it.  Patient takes lorazepam only as needed for severe panic attack.  He continues to go to gym twice a week.  Patient denies drinking alcohol or using any illegal substances.  He has no rash, itching, tremors or shakes.  He saw his primary care physician in January and his CBC, basic chemistry, TSH is normal.  His lipid panel shows cholesterol 205 and triglyceride 255.  Patient again scheduled to see his primary care physician Dr. Birdie Riddle in June.  His appetite is okay.  His vital signs are stable.  Visit Diagnosis:    ICD-10-CM   1. Bipolar 1 disorder (HCC) F31.9 sertraline (ZOLOFT) 100 MG tablet    OLANZapine (ZYPREXA) 20 MG tablet    lamoTRIgine (LAMICTAL) 150 MG tablet    hydrOXYzine (VISTARIL) 25 MG capsule  2. Anxiety F41.9 LORazepam (ATIVAN) 0.5 MG tablet    Past Psychiatric History: Reviewed. Patient has history of mania and impulsive behavior. In the past he tried Abilify and Geodon but did not help him.  Patient denies any history of psychiatric inpatient treatment or any suicidal attempt.  Past Medical History:  Past Medical History:  Diagnosis Date  . Bipolar disorder  ([place])   . Depression   . Hyperlipemia   . Ulcer    stomach    Past Surgical History:  Procedure Laterality Date  . feet surgery     bone spurs  . FOOT SURGERY  2012   Bil  . TONSILLECTOMY      Family Psychiatric History: Reviewed.  Family History:  Family History  Problem Relation Age of Onset  . Hyperlipidemia Mother   . Heart disease Mother   . Hypertension Mother   . Hyperlipidemia Father     Social History:  Social History   Socioeconomic History  . Marital status: Married    Spouse name: None  . Number of children: None  . Years of education: None  . Highest education level: None  Social Needs  . Financial resource strain: None  . Food insecurity - worry: None  . Food insecurity - inability: None  . Transportation needs - medical: None  . Transportation needs - non-medical: None  Occupational History  . None  Tobacco Use  . Smoking status: Never Smoker  . Smokeless tobacco: Never Used  Substance and Sexual Activity  . Alcohol use: No    Alcohol/week: 0.0 oz    Comment: Occasional   . Drug use: No  . Sexual activity: Not Currently  Other Topics Concern  . None  Social History Narrative  . None  Allergies:  Allergies  Allergen Reactions  . Penicillins Rash    Metabolic Disorder Labs: Recent Results (from the past 2160 hour(s))  Lipid panel     Status: Abnormal   Collection Time: 04/06/17  9:36 AM  Result Value Ref Range   Cholesterol 205 (H) 0 - 200 mg/dL    Comment: ATP III Classification       Desirable:  < 200 mg/dL               Borderline High:  200 - 239 mg/dL          High:  > = 240 mg/dL   Triglycerides 255.0 (H) 0.0 - 149.0 mg/dL    Comment: Normal:  <150 mg/dLBorderline High:  150 - 199 mg/dL   HDL 32.50 (L) >39.00 mg/dL   VLDL 51.0 (H) 0.0 - 40.0 mg/dL   Total CHOL/HDL Ratio 6     Comment:                Men          Women1/2 Average Risk     3.4          3.3Average Risk          5.0          4.42X Average Risk          9.6           7.13X Average Risk          15.0          11.0                       NonHDL 172.81     Comment: NOTE:  Non-HDL goal should be 30 mg/dL higher than patient's LDL goal (i.e. LDL goal of < 70 mg/dL, would have non-HDL goal of < 100 mg/dL)  Basic metabolic panel     Status: None   Collection Time: 04/06/17  9:36 AM  Result Value Ref Range   Sodium 139 135 - 145 mEq/L   Potassium 4.3 3.5 - 5.1 mEq/L   Chloride 104 96 - 112 mEq/L   CO2 28 19 - 32 mEq/L   Glucose, Bld 91 70 - 99 mg/dL   BUN 13 6 - 23 mg/dL   Creatinine, Ser 1.01 0.40 - 1.50 mg/dL   Calcium 9.5 8.4 - 10.5 mg/dL   GFR 82.35 >60.00 mL/min  TSH     Status: None   Collection Time: 04/06/17  9:36 AM  Result Value Ref Range   TSH 3.05 0.35 - 4.50 uIU/mL  Hepatic function panel     Status: None   Collection Time: 04/06/17  9:36 AM  Result Value Ref Range   Total Bilirubin 0.4 0.2 - 1.2 mg/dL   Bilirubin, Direct 0.1 0.0 - 0.3 mg/dL   Alkaline Phosphatase 76 39 - 117 U/L   AST 17 0 - 37 U/L   ALT 29 0 - 53 U/L   Total Protein 6.6 6.0 - 8.3 g/dL   Albumin 4.4 3.5 - 5.2 g/dL  CBC with Differential/Platelet     Status: None   Collection Time: 04/06/17  9:36 AM  Result Value Ref Range   WBC 8.2 4.0 - 10.5 K/uL   RBC 4.46 4.22 - 5.81 Mil/uL   Hemoglobin 13.8 13.0 - 17.0 g/dL   HCT 41.7 39.0 - 52.0 %   MCV 93.7 78.0 - 100.0 fl   MCHC 33.0 30.0 -  36.0 g/dL   RDW 14.8 11.5 - 15.5 %   Platelets 267.0 150.0 - 400.0 K/uL   Neutrophils Relative % 67.0 43.0 - 77.0 %   Lymphocytes Relative 24.9 12.0 - 46.0 %   Monocytes Relative 6.2 3.0 - 12.0 %   Eosinophils Relative 1.7 0.0 - 5.0 %   Basophils Relative 0.2 0.0 - 3.0 %   Neutro Abs 5.5 1.4 - 7.7 K/uL   Lymphs Abs 2.1 0.7 - 4.0 K/uL   Monocytes Absolute 0.5 0.1 - 1.0 K/uL   Eosinophils Absolute 0.1 0.0 - 0.7 K/uL   Basophils Absolute 0.0 0.0 - 0.1 K/uL  PSA     Status: None   Collection Time: 04/06/17  9:36 AM  Result Value Ref Range   PSA 0.71 0.10 - 4.00 ng/mL     Comment: Test performed using Access Hybritech PSA Assay, a parmagnetic partical, chemiluminecent immunoassay.  LDL cholesterol, direct     Status: None   Collection Time: 04/06/17  9:36 AM  Result Value Ref Range   Direct LDL 139.0 mg/dL    Comment: Optimal:  <100 mg/dLNear or Above Optimal:  100-129 mg/dLBorderline High:  130-159 mg/dLHigh:  160-189 mg/dLVery High:  >190 mg/dL   Lab Results  Component Value Date   HGBA1C 5.5 08/08/2011   MPG 111 08/08/2011   No results found for: PROLACTIN Lab Results  Component Value Date   CHOL 205 (H) 04/06/2017   TRIG 255.0 (H) 04/06/2017   HDL 32.50 (L) 04/06/2017   CHOLHDL 6 04/06/2017   VLDL 51.0 (H) 04/06/2017   LDLCALC 120 (H) 04/03/2016   LDLCALC 110 (H) 07/04/2015   Lab Results  Component Value Date   TSH 3.05 04/06/2017   TSH 1.84 04/03/2016    Therapeutic Level Labs: No results found for: LITHIUM No results found for: VALPROATE No components found for:  CBMZ  Current Medications: Current Outpatient Medications  Medication Sig Dispense Refill  . atorvastatin (LIPITOR) 40 MG tablet TAKE 1 TABLET BY MOUTH AT BEDTIME 90 tablet 1  . atorvastatin (LIPITOR) 40 MG tablet TAKE ONE TABLET BY MOUTH AT BEDTIME 90 tablet 1  . B Complex Vitamins (B COMPLEX PO) Take by mouth.    . fenofibrate 160 MG tablet TAKE 1 TABLET BY MOUTH ONCE DAILY 90 tablet 1  . hydrOXYzine (VISTARIL) 25 MG capsule Take 1-2 capsule at be dtime 45 capsule 0  . lamoTRIgine (LAMICTAL) 150 MG tablet Take 1 tablet (150 mg total) by mouth 2 (two) times daily. 180 tablet 0  . LORazepam (ATIVAN) 0.5 MG tablet Take 1 tablet (0.5 mg total) by mouth daily as needed for anxiety. 20 tablet 0  . Multiple Vitamin (MULTIVITAMIN) tablet Take 1 tablet by mouth daily.    Marland Kitchen OLANZapine (ZYPREXA) 20 MG tablet Take 1 tablet (20 mg total) by mouth at bedtime. 90 tablet 0  . Omega-3 Fatty Acids (FISH OIL PO) Take by mouth.    . sertraline (ZOLOFT) 100 MG tablet Take 1.5 tablets (150 mg  total) by mouth daily. 135 tablet 0   No current facility-administered medications for this visit.      Musculoskeletal: Strength & Muscle Tone: within normal limits Gait & Station: normal Patient leans: N/A  Psychiatric Specialty Exam: ROS  Blood pressure 101/66, pulse 80, height 5\' 8"  (1.727 m), weight 179 lb 12.8 oz (81.6 kg).Body mass index is 27.34 kg/m.  General Appearance: Casual  Eye Contact:  Good  Speech:  Clear and Coherent  Volume:  Normal  Mood:  Anxious  Affect:  Appropriate  Thought Process:  Goal Directed  Orientation:  Full (Time, Place, and Person)  Thought Content: Rumination   Suicidal Thoughts:  No  Homicidal Thoughts:  No  Memory:  Immediate;   Good Recent;   Good Remote;   Good  Judgement:  Good  Insight:  Good  Psychomotor Activity:  Normal  Concentration:  Concentration: Good and Attention Span: Good  Recall:  Good  Fund of Knowledge: Good  Language: Good  Akathisia:  No  Handed:  Right  AIMS (if indicated): not done  Assets:  Housing Talents/Skills Transportation  ADL's:  Intact  Cognition: WNL  Sleep:  Fair   Screenings: PHQ2-9     Office Visit from 04/06/2017 in [facility] Office Visit from 04/03/2016 in [facility] Office Visit from 10/19/2015 in [facility] Office Visit from 01/03/2015 in [facility] at [facility]  PHQ-2 Total Score  0  0  0  0  PHQ-9 Total Score  0  0  No data  No data       Assessment and Plan: Bipolar disorder type I.  Panic attacks.  Patient doing better on his current medication.  He has no major panic attack.  I review collect information from his primary care physician and recent blood work results.  He takes Ativan as as needed.  He will require a new prescription of Ativan to take 1 tablet as needed for severe panic attack #20.  Continue Vistaril 25 mg at bedtime,  Zyprexa 20 mg at bedtime, Lamictal 300 mg daily and Zoloft 150 mg daily.  Discussed medication side effects and benefits.  Patient is not interested in counseling.  Recommended to call us back if is any question, concern for feel worsening of the symptoms.  Follow-up in 3 months.   Kathlee Nations, MD 05/28/2017, 8:08 AM

## 2017-08-26 ENCOUNTER — Ambulatory Visit (HOSPITAL_COMMUNITY): Payer: Self-pay | Admitting: Psychiatry

## 2017-09-01 ENCOUNTER — Other Ambulatory Visit (HOSPITAL_COMMUNITY): Payer: Self-pay

## 2017-09-01 DIAGNOSIS — F319 Bipolar disorder, unspecified: Secondary | ICD-10-CM

## 2017-09-01 MED ORDER — LAMOTRIGINE 150 MG PO TABS
150.0000 mg | ORAL_TABLET | Freq: Two times a day (BID) | ORAL | 0 refills | Status: DC
Start: 2017-09-01 — End: 2017-09-16

## 2017-09-01 MED ORDER — OLANZAPINE 20 MG PO TABS: 20.0000 mg | ORAL_TABLET | Freq: Every day | ORAL | 0 refills | Status: DC

## 2017-09-01 MED ORDER — SERTRALINE HCL 100 MG PO TABS: 150.0000 mg | ORAL_TABLET | Freq: Every day | ORAL | 0 refills | Status: DC

## 2017-09-01 NOTE — Progress Notes (Signed)
Sent in 30 day supply per Silvio Pate

## 2017-09-16 ENCOUNTER — Ambulatory Visit (INDEPENDENT_AMBULATORY_CARE_PROVIDER_SITE_OTHER): Payer: BLUE CROSS/BLUE SHIELD | Admitting: Psychiatry

## 2017-09-16 ENCOUNTER — Encounter (HOSPITAL_COMMUNITY): Payer: Self-pay | Admitting: Psychiatry

## 2017-09-16 DIAGNOSIS — F419 Anxiety disorder, unspecified: Secondary | ICD-10-CM

## 2017-09-16 DIAGNOSIS — F319 Bipolar disorder, unspecified: Secondary | ICD-10-CM

## 2017-09-16 MED ORDER — HYDROXYZINE PAMOATE 25 MG PO CAPS: ORAL_CAPSULE | ORAL | 0 refills | Status: DC

## 2017-09-16 MED ORDER — OLANZAPINE 20 MG PO TABS: 20.0000 mg | ORAL_TABLET | Freq: Every day | ORAL | 0 refills | Status: DC

## 2017-09-16 MED ORDER — LORAZEPAM 0.5 MG PO TABS: 0.5000 mg | ORAL_TABLET | Freq: Every day | ORAL | 0 refills | Status: DC | PRN

## 2017-09-16 MED ORDER — SERTRALINE HCL 100 MG PO TABS: 150.0000 mg | ORAL_TABLET | Freq: Every day | ORAL | 0 refills | Status: DC

## 2017-09-16 MED ORDER — LAMOTRIGINE 150 MG PO TABS: 150.0000 mg | ORAL_TABLET | Freq: Two times a day (BID) | ORAL | 0 refills | Status: DC

## 2017-09-16 NOTE — Progress Notes (Signed)
[place] MD/PA/NP OP Progress Note  09/16/2017 8:41 AM Timothy Burnett  MRN:  [id number]  Chief Complaint: I am taking the medication.  Job is a stressful but doing very well.  HPI: Patient came for his follow-up appointment.  He is taking his medication as prescribed.  He really takes Ativan but he has taken 1 or 2 times when he was very stressed.  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  He is very excited about upcoming trip to [place] with the family member.  He stopped working second job because he could not do it with his current job.  He is not paying child support because it was suspended as her daughter did not graduate.  Patient told her 53 year old daughter has mental challenges and currently she is going through a lot of tests in [place].  Overall patient describe her mood is stable.  He denies any irritability, anger, mania, psychosis or any hallucination.  He is sleeping good.  He denies any paranoia or any suicidal thoughts.  His appetite is okay.  He continues to attend gym twice a week.  His energy level is good.  Patient denies drinking alcohol or using any illegal substances.  Visit Diagnosis:    ICD-10-CM   1. Bipolar 1 disorder (HCC) F31.9 sertraline (ZOLOFT) 100 MG tablet    OLANZapine (ZYPREXA) 20 MG tablet    lamoTRIgine (LAMICTAL) 150 MG tablet    hydrOXYzine (VISTARIL) 25 MG capsule  2. Anxiety F41.9 LORazepam (ATIVAN) 0.5 MG tablet    Past Psychiatric History: Viewed. Patient has history of mania and impulsive behavior. In the past he tried Abilify and Geodon but did not help him. Patient denies any history of psychiatric inpatient treatment or any suicidal attempt.  Past Medical History:  Past Medical History:  Diagnosis Date  . Bipolar disorder ([place])   . Depression   . Hyperlipemia   . Ulcer    stomach    Past Surgical History:  Procedure Laterality Date  . feet surgery     bone spurs   . FOOT SURGERY  2012   Bil  . TONSILLECTOMY      Family Psychiatric History: Reviewed.  Family History:  Family History  Problem Relation Age of Onset  . Hyperlipidemia Mother   . Heart disease Mother   . Hypertension Mother   . Hyperlipidemia Father     Social History:  Social History   Socioeconomic History  . Marital status: Married    Spouse name: Not on file  . Number of children: Not on file  . Years of education: Not on file  . Highest education level: Not on file  Occupational History  . Not on file  Social Needs  . Financial resource strain: Not on file  . Food insecurity:    Worry: Not on file    Inability: Not on file  . Transportation needs:    Medical: Not on file    Non-medical: Not on file  Tobacco Use  . Smoking status: Never Smoker  . Smokeless tobacco: Never Used  Substance and Sexual Activity  . Alcohol use: No    Alcohol/week: 0.0 oz    Comment: Occasional   . Drug use: No  . Sexual activity: Not Currently  Lifestyle  . Physical activity:    Days per week: Not on file    Minutes per session: Not on file  . Stress:  Not on file  Relationships  . Social connections:    Talks on phone: Not on file    Gets together: Not on file    Attends religious service: Not on file    Active member of club or organization: Not on file    Attends meetings of clubs or organizations: Not on file    Relationship status: Not on file  Other Topics Concern  . Not on file  Social History Narrative  . Not on file    Allergies:  Allergies  Allergen Reactions  . Penicillins Rash    Metabolic Disorder Labs: Lab Results  Component Value Date   HGBA1C 5.5 08/08/2011   MPG 111 08/08/2011   No results found for: PROLACTIN Lab Results  Component Value Date   CHOL 205 (H) 04/06/2017   TRIG 255.0 (H) 04/06/2017   HDL 32.50 (L) 04/06/2017   CHOLHDL 6 04/06/2017   VLDL 51.0 (H) 04/06/2017   LDLCALC 120 (H) 04/03/2016   LDLCALC 110 (H) 07/04/2015    Lab Results  Component Value Date   TSH 3.05 04/06/2017   TSH 1.84 04/03/2016    Therapeutic Level Labs: No results found for: LITHIUM No results found for: VALPROATE No components found for:  CBMZ  Current Medications: Current Outpatient Medications  Medication Sig Dispense Refill  . atorvastatin (LIPITOR) 40 MG tablet TAKE 1 TABLET BY MOUTH AT BEDTIME 90 tablet 1  . B Complex Vitamins (B COMPLEX PO) Take by mouth.    . fenofibrate 160 MG tablet TAKE 1 TABLET BY MOUTH ONCE DAILY 90 tablet 1  . hydrOXYzine (VISTARIL) 25 MG capsule Take 1capsule at bed time 90 capsule 0  . lamoTRIgine (LAMICTAL) 150 MG tablet Take 1 tablet (150 mg total) by mouth 2 (two) times daily. 60 tablet 0  . LORazepam (ATIVAN) 0.5 MG tablet Take 1 tablet (0.5 mg total) by mouth daily as needed for anxiety. 20 tablet 0  . Multiple Vitamin (MULTIVITAMIN) tablet Take 1 tablet by mouth daily.    Marland Kitchen OLANZapine (ZYPREXA) 20 MG tablet Take 1 tablet (20 mg total) by mouth at bedtime. 30 tablet 0  . Omega-3 Fatty Acids (FISH OIL PO) Take by mouth.    . sertraline (ZOLOFT) 100 MG tablet Take 1.5 tablets (150 mg total) by mouth daily. 60 tablet 0   No current facility-administered medications for this visit.      Musculoskeletal: Strength & Muscle Tone: within normal limits Gait & Station: normal Patient leans: N/A  Psychiatric Specialty Exam: ROS  Blood pressure 130/74, pulse 81, height 5\' 8"  (1.727 m), weight 175 lb (79.4 kg).Body mass index is 26.61 kg/m.  General Appearance: Casual  Eye Contact:  Good  Speech:  Clear and Coherent  Volume:  Normal  Mood:  Euthymic  Affect:  Congruent  Thought Process:  Goal Directed  Orientation:  Full (Time, Place, and Person)  Thought Content: Logical   Suicidal Thoughts:  No  Homicidal Thoughts:  No  Memory:  Immediate;   Good Recent;   Good Remote;   Good  Judgement:  Good  Insight:  Good  Psychomotor Activity:  Normal  Concentration:  Concentration: Good  and Attention Span: Good  Recall:  Good  Fund of Knowledge: Good  Language: Good  Akathisia:  No  Handed:  Right  AIMS (if indicated): not done  Assets:  Communication Skills Desire for [place] Talents/Skills Transportation  ADL's:  Intact  Cognition: WNL  Sleep:  Good  Screenings: PHQ2-9     Office Visit from 04/06/2017 in [facility] Office Visit from 04/03/2016 in [facility] Office Visit from 10/19/2015 in [facility] Office Visit from 01/03/2015 in [place] at [facility]  PHQ-2 Total Score  0  0  0  0  PHQ-9 Total Score  0  0  -  -       Assessment and Plan: Bipolar disorder type I.  Panic attacks.  Patient doing better on his current medication.  He has no tremors, shakes or any EPS.  Continue Lamictal 300 mg daily, Zoloft 150 mg daily, Vistaril 25 mg as needed at bedtime, Zyprexa 20 mg at bedtime and Ativan 0.5 mg for severe panic attack #20.  Patient is not interested in counseling.  Recommended to call us back if he has any question or any concern.  Encouraged to continue healthy lifestyle.  Follow-up in 3 months.   Kathlee Nations, MD 09/16/2017, 8:41 AM

## 2017-10-05 ENCOUNTER — Ambulatory Visit: Payer: Self-pay | Admitting: Family Medicine

## 2017-10-07 ENCOUNTER — Other Ambulatory Visit: Payer: Self-pay

## 2017-10-07 ENCOUNTER — Encounter: Payer: Self-pay | Admitting: Family Medicine

## 2017-10-07 ENCOUNTER — Ambulatory Visit (INDEPENDENT_AMBULATORY_CARE_PROVIDER_SITE_OTHER): Payer: BLUE CROSS/BLUE SHIELD | Admitting: Family Medicine

## 2017-10-07 VITALS — BP 106/78 | HR 76 | Temp 98.2°F | Resp 16 | Ht 68.0 in | Wt 173.2 lb

## 2017-10-07 DIAGNOSIS — E785 Hyperlipidemia, unspecified: Secondary | ICD-10-CM

## 2017-10-07 LAB — LIPID PANEL
CHOL/HDL RATIO: 5
CHOLESTEROL: 170 mg/dL (ref 0–200)
HDL: 31.7 mg/dL — ABNORMAL LOW (ref >39.00)
LDL CALC: 112 mg/dL — AB (ref 0–99)
NonHDL: 137.81
TRIGLYCERIDES: 128 mg/dL (ref 0.0–149.0)
VLDL: 25.6 mg/dL (ref 0.0–40.0)

## 2017-10-07 LAB — HEPATIC FUNCTION PANEL
ALT: 18 U/L (ref 0–53)
AST: 15 U/L (ref 0–37)
Albumin: 4.3 g/dL (ref 3.5–5.2)
Alkaline Phosphatase: 48 U/L (ref 39–117)
BILIRUBIN TOTAL: 0.4 mg/dL (ref 0.2–1.2)
Bilirubin, Direct: 0.1 mg/dL (ref 0.0–0.3)
TOTAL PROTEIN: 6.6 g/dL (ref 6.0–8.3)

## 2017-10-07 LAB — BASIC METABOLIC PANEL
BUN: 17 mg/dL (ref 6–23)
CHLORIDE: 106 meq/L (ref 96–112)
CO2: 30 meq/L (ref 19–32)
CREATININE: 1.15 mg/dL (ref 0.40–1.50)
Calcium: 9.6 mg/dL (ref 8.4–10.5)
GFR: 70.76 mL/min (ref >60.00)
Glucose, Bld: 89 mg/dL (ref 70–99)
POTASSIUM: 4.5 meq/L (ref 3.5–5.1)
Sodium: 139 mEq/L (ref 135–145)

## 2017-10-07 MED ORDER — ATORVASTATIN CALCIUM 40 MG PO TABS: ORAL_TABLET | ORAL | 1 refills | Status: DC

## 2017-10-07 MED ORDER — FENOFIBRATE 160 MG PO TABS: 160.0000 mg | ORAL_TABLET | Freq: Every day | ORAL | 1 refills | Status: DC

## 2017-10-07 NOTE — Assessment & Plan Note (Signed)
Chronic problem.  Tolerating statin w/o difficulty.  Encouraged regular exercise.  Check labs.  Adjust meds prn

## 2017-10-07 NOTE — Progress Notes (Signed)
   Subjective:    Patient ID: Halden Phegley, male    DOB: 1965/12/31, 52 y.o.   MRN: [id number]  HPI Hyperlipidemia- chronic problem, on Lipitor 40mg  daily, Fenofibrate 160mg  daily.  Pt is getting ~25k steps daily but no formal exercise.  No CP, SOB, HAs, visual changes, abd pain, N/V, edema.    Review of Systems For ROS see HPI     Objective:   Physical Exam  Constitutional: He is oriented to person, place, and time. He appears well-developed and well-nourished. No distress.  HENT:  Head: Normocephalic and atraumatic.  Eyes: Pupils are equal, round, and reactive to light. Conjunctivae and EOM are normal.  Neck: Normal range of motion. Neck supple. No thyromegaly present.  Cardiovascular: Normal rate, regular rhythm, normal heart sounds and intact distal pulses.  No murmur heard. Pulmonary/Chest: Effort normal and breath sounds normal. No respiratory distress.  Abdominal: Soft. Bowel sounds are normal. He exhibits no distension.  Musculoskeletal: He exhibits no edema.  Lymphadenopathy:    He has no cervical adenopathy.  Neurological: He is alert and oriented to person, place, and time. No cranial nerve deficit.  Skin: Skin is warm and dry.  Psychiatric: He has a normal mood and affect. His behavior is normal.  Vitals reviewed.         Assessment & Plan:

## 2017-10-07 NOTE — Patient Instructions (Signed)
Schedule your complete physical in 6 months We'll notify you of your lab results and make any changes if needed Continue to work on healthy diet and regular exercise Call with any questions or concerns Have a great summer!! [place]!

## 2017-10-08 ENCOUNTER — Encounter: Payer: Self-pay | Admitting: General Practice

## 2017-10-28 ENCOUNTER — Other Ambulatory Visit (HOSPITAL_COMMUNITY): Payer: Self-pay | Admitting: Psychiatry

## 2017-10-28 DIAGNOSIS — F319 Bipolar disorder, unspecified: Secondary | ICD-10-CM

## 2017-10-30 ENCOUNTER — Other Ambulatory Visit (HOSPITAL_COMMUNITY): Payer: Self-pay

## 2017-10-30 DIAGNOSIS — F319 Bipolar disorder, unspecified: Secondary | ICD-10-CM

## 2017-10-30 MED ORDER — LAMOTRIGINE 150 MG PO TABS: 150.0000 mg | ORAL_TABLET | Freq: Two times a day (BID) | ORAL | 0 refills | Status: DC

## 2017-12-09 ENCOUNTER — Other Ambulatory Visit (HOSPITAL_COMMUNITY): Payer: Self-pay

## 2017-12-09 DIAGNOSIS — F319 Bipolar disorder, unspecified: Secondary | ICD-10-CM

## 2017-12-09 MED ORDER — LAMOTRIGINE 150 MG PO TABS: 150.0000 mg | ORAL_TABLET | Freq: Two times a day (BID) | ORAL | 0 refills | Status: DC

## 2017-12-17 ENCOUNTER — Ambulatory Visit (INDEPENDENT_AMBULATORY_CARE_PROVIDER_SITE_OTHER): Payer: BLUE CROSS/BLUE SHIELD | Admitting: Psychiatry

## 2017-12-17 ENCOUNTER — Encounter (HOSPITAL_COMMUNITY): Payer: Self-pay | Admitting: Psychiatry

## 2017-12-17 DIAGNOSIS — F410 Panic disorder [episodic paroxysmal anxiety] without agoraphobia: Secondary | ICD-10-CM | POA: Diagnosis not present

## 2017-12-17 DIAGNOSIS — F419 Anxiety disorder, unspecified: Secondary | ICD-10-CM | POA: Diagnosis not present

## 2017-12-17 DIAGNOSIS — Z5689 Other problems related to employment: Secondary | ICD-10-CM | POA: Diagnosis not present

## 2017-12-17 DIAGNOSIS — F319 Bipolar disorder, unspecified: Secondary | ICD-10-CM

## 2017-12-17 MED ORDER — OLANZAPINE 20 MG PO TABS: 20.0000 mg | ORAL_TABLET | Freq: Every day | ORAL | 0 refills | Status: DC

## 2017-12-17 MED ORDER — SERTRALINE HCL 100 MG PO TABS: 150.0000 mg | ORAL_TABLET | Freq: Every day | ORAL | 0 refills | Status: DC

## 2017-12-17 MED ORDER — HYDROXYZINE PAMOATE 50 MG PO CAPS: ORAL_CAPSULE | ORAL | 0 refills | Status: DC

## 2017-12-17 MED ORDER — LORAZEPAM 0.5 MG PO TABS: 0.5000 mg | ORAL_TABLET | Freq: Every day | ORAL | 0 refills | Status: DC | PRN

## 2017-12-17 NOTE — Progress Notes (Signed)
[facility] MD/PA/NP OP Progress Note  12/17/2017 8:26 AM Timothy Burnett  MRN:  [id number]  Chief Complaint: I had a good cruise trip to [place].  I really enjoyed time with my family.  HPI: Timothy Burnett came for his follow-up appointment.  He had a cruise trip to [place] with his family for 8 days and he really enjoyed spending time with the family and cruise.  However he continued to express a lot of stress at work.  He is been working 12 to 14 hours a day.  Now he is exploring the options to find a different job.  He admitted there are days when he took lorazepam more frequently but now he is back to take 0.5 mg as needed.  He admitted there are times when he had poor sleep and racing thoughts but denies any irritability, psychosis, mania or any suicidal thoughts.  He recently seen his primary care physician Dr. Birdie Riddle and he was pleased everything is okay.  Patient denies any tremors, shakes or any EPS.  He continues to attend gym twice a week and walking.  He averages walk 10 to 14 miles a week.  Patient denies any paranoia or any hallucination.  His appetite is okay.  His energy level is good.  He denies drinking or using any illegal substances.  Visit Diagnosis:    ICD-10-CM   1. Bipolar 1 disorder (HCC) F31.9 sertraline (ZOLOFT) 100 MG tablet    OLANZapine (ZYPREXA) 20 MG tablet    hydrOXYzine (VISTARIL) 50 MG capsule  2. Anxiety F41.9 LORazepam (ATIVAN) 0.5 MG tablet    Past Psychiatric History: Viewed. Patient has history of mania and impulsive behavior. In the past he tried Abilify and Geodon but did not help him.Patient denies any history of psychiatric inpatient treatment or any suicidal attempt. Past Medical History:  Past Medical History:  Diagnosis Date  . Bipolar disorder ([place])   . Depression   . Hyperlipemia   . Ulcer    stomach    Past Surgical History:  Procedure Laterality Date  . feet surgery     bone spurs  . FOOT SURGERY  2012   Bil  . TONSILLECTOMY      Family Psychiatric  History: Reviewed.  Family History:  Family History  Problem Relation Age of Onset  . Hyperlipidemia Mother   . Heart disease Mother   . Hypertension Mother   . Hyperlipidemia Father     Social History:  Social History   Socioeconomic History  . Marital status: Married    Spouse name: Not on file  . Number of children: Not on file  . Years of education: Not on file  . Highest education level: Not on file  Occupational History  . Not on file  Social Needs  . Financial resource strain: Not on file  . Food insecurity:    Worry: Not on file    Inability: Not on file  . Transportation needs:    Medical: Not on file    Non-medical: Not on file  Tobacco Use  . Smoking status: Never Smoker  . Smokeless tobacco: Never Used  Substance and Sexual Activity  . Alcohol use: No    Alcohol/week: 0.0 standard drinks    Comment: Occasional   . Drug use: No  . Sexual activity: Not Currently  Lifestyle  . Physical activity:    Days per week: Not on file    Minutes per session: Not on file  . Stress: Not on file  Relationships  .  Social connections:    Talks on phone: Not on file    Gets together: Not on file    Attends religious service: Not on file    Active member of club or organization: Not on file    Attends meetings of clubs or organizations: Not on file    Relationship status: Not on file  Other Topics Concern  . Not on file  Social History Narrative  . Not on file    Allergies:  Allergies  Allergen Reactions  . Penicillins Rash    Metabolic Disorder Labs: Lab Results  Component Value Date   HGBA1C 5.5 08/08/2011   MPG 111 08/08/2011   No results found for: PROLACTIN Lab Results  Component Value Date   CHOL 170 10/07/2017   TRIG 128.0 10/07/2017   HDL 31.70 (L) 10/07/2017   CHOLHDL 5 10/07/2017   VLDL 25.6 10/07/2017   LDLCALC 112 (H) 10/07/2017   LDLCALC 120 (H) 04/03/2016   Lab Results  Component Value Date   TSH 3.05 04/06/2017   TSH 1.84  04/03/2016    Therapeutic Level Labs: No results found for: LITHIUM No results found for: VALPROATE No components found for:  CBMZ  Current Medications: Current Outpatient Medications  Medication Sig Dispense Refill  . atorvastatin (LIPITOR) 40 MG tablet TAKE 1 TABLET BY MOUTH AT BEDTIME 90 tablet 1  . B Complex Vitamins (B COMPLEX PO) Take by mouth.    . fenofibrate 160 MG tablet Take 1 tablet (160 mg total) by mouth daily. 90 tablet 1  . hydrOXYzine (VISTARIL) 25 MG capsule Take 1capsule at bed time 90 capsule 0  . lamoTRIgine (LAMICTAL) 150 MG tablet Take 1 tablet (150 mg total) by mouth 2 (two) times daily. 60 tablet 0  . LORazepam (ATIVAN) 0.5 MG tablet Take 1 tablet (0.5 mg total) by mouth daily as needed for anxiety. 20 tablet 0  . Multiple Vitamin (MULTIVITAMIN) tablet Take 1 tablet by mouth daily.    Marland Kitchen OLANZapine (ZYPREXA) 20 MG tablet Take 1 tablet (20 mg total) by mouth at bedtime. 90 tablet 0  . Omega-3 Fatty Acids (FISH OIL PO) Take by mouth.    . sertraline (ZOLOFT) 100 MG tablet Take 1.5 tablets (150 mg total) by mouth daily. 135 tablet 0   No current facility-administered medications for this visit.      Musculoskeletal: Strength & Muscle Tone: within normal limits Gait & Station: normal Patient leans: N/A  Psychiatric Specialty Exam: ROS  Blood pressure 132/74, pulse 74, height 5\' 8"  (1.727 m), weight 179 lb (81.2 kg).Body mass index is 27.22 kg/m.  General Appearance: Casual  Eye Contact:  Good  Speech:  Clear and Coherent  Volume:  Normal  Mood:  Euthymic  Affect:  Appropriate  Thought Process:  Goal Directed  Orientation:  Full (Time, Place, and Person)  Thought Content: Logical   Suicidal Thoughts:  No  Homicidal Thoughts:  No  Memory:  Immediate;   Good Recent;   Good Remote;   Good  Judgement:  Good  Insight:  Good  Psychomotor Activity:  Normal  Concentration:  Concentration: Good and Attention Span: Good  Recall:  Good  Fund of Knowledge:  Good  Language: Good  Akathisia:  No  Handed:  Right  AIMS (if indicated): not done  Assets:  Communication Skills Desire for Improvement Housing Resilience Social Support Talents/Skills  ADL's:  Intact  Cognition: WNL  Sleep:  Fair   Screenings: PHQ2-9     Office Visit from  10/07/2017 in [facility] Office Visit from 04/06/2017 in [facility] Office Visit from 04/03/2016 in [facility] Office Visit from 10/19/2015 in [facility] Office Visit from 01/03/2015 in [facility] at [facility]  PHQ-2 Total Score  0  0  0  0  0  PHQ-9 Total Score  0  0  0  -  -       Assessment and Plan: Bipolar disorder type I.  Panic attacks.  Discussed his insomnia and stress at work.  He is exploring to find another job.  He denies any major panic attack other than stress at work.  I will continue Zoloft 150 mg daily, Lamictal 300 mg daily, Zyprexa 20 mg at bedtime and lorazepam 0.5 mg for severe panic attacks.  I recommended to try Vistaril 50 mg to help insomnia.  Discussed medication side effects and benefits.  He has no rash, itching, tremors or shakes.  He is not interested in counseling.  Encouraged to continue healthy lifestyle and continue going to gym and walking.  Recommended to call us back if he has any question, concern for feel worsening of the symptoms.  Follow-up in 3 months.   Kathlee Nations, MD 12/17/2017, 8:26 AM

## 2018-01-05 ENCOUNTER — Other Ambulatory Visit (HOSPITAL_COMMUNITY): Payer: Self-pay

## 2018-01-05 DIAGNOSIS — F319 Bipolar disorder, unspecified: Secondary | ICD-10-CM

## 2018-01-05 MED ORDER — LAMOTRIGINE 150 MG PO TABS: 150.0000 mg | ORAL_TABLET | Freq: Two times a day (BID) | ORAL | 0 refills | Status: DC

## 2018-04-01 ENCOUNTER — Other Ambulatory Visit (HOSPITAL_COMMUNITY): Payer: Self-pay

## 2018-04-01 DIAGNOSIS — F319 Bipolar disorder, unspecified: Secondary | ICD-10-CM

## 2018-04-01 MED ORDER — OLANZAPINE 20 MG PO TABS: 20.0000 mg | ORAL_TABLET | Freq: Every day | ORAL | 0 refills | Status: DC

## 2018-04-01 MED ORDER — HYDROXYZINE PAMOATE 50 MG PO CAPS: ORAL_CAPSULE | ORAL | 0 refills | Status: DC

## 2018-04-01 MED ORDER — SERTRALINE HCL 100 MG PO TABS: 150.0000 mg | ORAL_TABLET | Freq: Every day | ORAL | 0 refills | Status: DC

## 2018-04-06 ENCOUNTER — Ambulatory Visit (HOSPITAL_COMMUNITY): Payer: BLUE CROSS/BLUE SHIELD | Admitting: Psychiatry

## 2018-04-07 ENCOUNTER — Ambulatory Visit (HOSPITAL_COMMUNITY): Payer: BLUE CROSS/BLUE SHIELD | Admitting: Psychiatry

## 2018-04-07 ENCOUNTER — Encounter (HOSPITAL_COMMUNITY): Payer: Self-pay | Admitting: Psychiatry

## 2018-04-07 DIAGNOSIS — F319 Bipolar disorder, unspecified: Secondary | ICD-10-CM

## 2018-04-07 DIAGNOSIS — F419 Anxiety disorder, unspecified: Secondary | ICD-10-CM | POA: Diagnosis not present

## 2018-04-07 MED ORDER — OLANZAPINE 20 MG PO TABS: 20.0000 mg | ORAL_TABLET | Freq: Every day | ORAL | 0 refills | Status: DC

## 2018-04-07 MED ORDER — HYDROXYZINE HCL 10 MG PO TABS: ORAL_TABLET | ORAL | 0 refills | Status: DC

## 2018-04-07 MED ORDER — LORAZEPAM 0.5 MG PO TABS: 0.5000 mg | ORAL_TABLET | Freq: Every day | ORAL | 0 refills | Status: DC | PRN

## 2018-04-07 MED ORDER — LAMOTRIGINE 150 MG PO TABS: 150.0000 mg | ORAL_TABLET | Freq: Two times a day (BID) | ORAL | 0 refills | Status: DC

## 2018-04-07 MED ORDER — SERTRALINE HCL 100 MG PO TABS: 150.0000 mg | ORAL_TABLET | Freq: Every day | ORAL | 0 refills | Status: DC

## 2018-04-07 NOTE — Progress Notes (Signed)
[facility] MD/PA/NP OP Progress Note  04/07/2018 8:26 AM Timothy Burnett  MRN:  [id number]  Chief Complaint: I am feeling better.  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  HPI: Timothy Burnett came for his follow-up appointment.  He had a good Christmas and holidays.  He spent time with the family and visit [place] to see his girlfriend's family member.  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  He took hydroxyzine which was given on his last visit but sometimes it makes him very groggy in the morning.  He is not taking as frequently because he feel he is more capable to handle the situation.  He denies any racing thoughts, irritability psychosis mania or any suicidal thoughts.  He is scheduled to see his primary care physician Dr. Lindell Noe next week.  He continues to walk 10 to 40 miles a week.  He denies any paranoia or any hallucination.  He is wondering if hydroxyzine dose can reduce so he can still take as needed for insomnia and anxiety.  He is taking lorazepam 1 to 2 tablet once a week when he is very anxious.  His energy level is good.  His appetite is okay.  His vital signs are stable.  Patient denies drinking or using any illegal substances.  Visit Diagnosis:    ICD-10-CM   1. Bipolar 1 disorder (HCC) F31.9 sertraline (ZOLOFT) 100 MG tablet    OLANZapine (ZYPREXA) 20 MG tablet    lamoTRIgine (LAMICTAL) 150 MG tablet  2. Anxiety F41.9 LORazepam (ATIVAN) 0.5 MG tablet    hydrOXYzine (ATARAX/VISTARIL) 10 MG tablet    Past Psychiatric History: Reviewed. History of mania and impulsive behavior.  No history of psychiatric inpatient treatment or any suicidal attempt.  Tried Abilify and Geodon with poor outcome.  Past Medical History:  Past Medical History:  Diagnosis Date  . Bipolar disorder ([place])   . Depression   . Hyperlipemia   . Ulcer    stomach    Past Surgical History:  Procedure Laterality Date  . feet surgery      bone spurs  . FOOT SURGERY  2012   Bil  . TONSILLECTOMY      Family Psychiatric History: Reviewed.  Family History:  Family History  Problem Relation Age of Onset  . Hyperlipidemia Mother   . Heart disease Mother   . Hypertension Mother   . Hyperlipidemia Father     Social History:  Social History   Socioeconomic History  . Marital status: Married    Spouse name: Not on file  . Number of children: Not on file  . Years of education: Not on file  . Highest education level: Not on file  Occupational History  . Not on file  Social Needs  . Financial resource strain: Not on file  . Food insecurity:    Worry: Not on file    Inability: Not on file  . Transportation needs:    Medical: Not on file    Non-medical: Not on file  Tobacco Use  . Smoking status: Never Smoker  . Smokeless tobacco: Never Used  Substance and Sexual Activity  . Alcohol use: No    Alcohol/week: 0.0 standard drinks    Comment: Occasional   . Drug use: No  . Sexual activity: Not Currently  Lifestyle  . Physical activity:    Days per week: Not on file    Minutes per session: Not on  file  . Stress: Not on file  Relationships  . Social connections:    Talks on phone: Not on file    Gets together: Not on file    Attends religious service: Not on file    Active member of club or organization: Not on file    Attends meetings of clubs or organizations: Not on file    Relationship status: Not on file  Other Topics Concern  . Not on file  Social History Narrative  . Not on file    Allergies:  Allergies  Allergen Reactions  . Penicillins Rash    Metabolic Disorder Labs: Lab Results  Component Value Date   HGBA1C 5.5 08/08/2011   MPG 111 08/08/2011   No results found for: PROLACTIN Lab Results  Component Value Date   CHOL 170 10/07/2017   TRIG 128.0 10/07/2017   HDL 31.70 (L) 10/07/2017   CHOLHDL 5 10/07/2017   VLDL 25.6 10/07/2017   LDLCALC 112 (H) 10/07/2017   LDLCALC 120 (H)  04/03/2016   Lab Results  Component Value Date   TSH 3.05 04/06/2017   TSH 1.84 04/03/2016    Therapeutic Level Labs: No results found for: LITHIUM No results found for: VALPROATE No components found for:  CBMZ  Current Medications: Current Outpatient Medications  Medication Sig Dispense Refill  . atorvastatin (LIPITOR) 40 MG tablet TAKE 1 TABLET BY MOUTH AT BEDTIME 90 tablet 1  . B Complex Vitamins (B COMPLEX PO) Take by mouth.    . fenofibrate 160 MG tablet Take 1 tablet (160 mg total) by mouth daily. 90 tablet 1  . hydrOXYzine (VISTARIL) 50 MG capsule Take 1capsule at bed time 30 capsule 0  . lamoTRIgine (LAMICTAL) 150 MG tablet Take 1 tablet (150 mg total) by mouth 2 (two) times daily. 180 tablet 0  . LORazepam (ATIVAN) 0.5 MG tablet Take 1 tablet (0.5 mg total) by mouth daily as needed for anxiety. 20 tablet 0  . Multiple Vitamin (MULTIVITAMIN) tablet Take 1 tablet by mouth daily.    Marland Kitchen OLANZapine (ZYPREXA) 20 MG tablet Take 1 tablet (20 mg total) by mouth at bedtime. 30 tablet 0  . Omega-3 Fatty Acids (FISH OIL PO) Take by mouth.    . sertraline (ZOLOFT) 100 MG tablet Take 1.5 tablets (150 mg total) by mouth daily. 45 tablet 0   No current facility-administered medications for this visit.      Musculoskeletal: Strength & Muscle Tone: within normal limits Gait & Station: normal Patient leans: N/A  Psychiatric Specialty Exam: ROS  Blood pressure 120/78, pulse 80, height 5\' 8"  (1.727 m), weight 180 lb 3.2 oz (81.7 kg).Body mass index is 27.4 kg/m.  General Appearance: Casual  Eye Contact:  Good  Speech:  Clear and Coherent  Volume:  Normal  Mood:  Euthymic  Affect:  Appropriate  Thought Process:  Goal Directed  Orientation:  Full (Time, Place, and Person)  Thought Content: Logical   Suicidal Thoughts:  No  Homicidal Thoughts:  No  Memory:  Immediate;   Good Recent;   Good Remote;   Good  Judgement:  Good  Insight:  Good  Psychomotor Activity:  Normal   Concentration:  Concentration: Good and Attention Span: Good  Recall:  Good  Fund of Knowledge: Good  Language: Good  Akathisia:  No  Handed:  Right  AIMS (if indicated): not done  Assets:  Communication Skills Desire for [place] Talents/Skills  ADL's:  Intact  Cognition: WNL  Sleep:  Good   Screenings: PHQ2-9     Office Visit from 10/07/2017 in [facility] Office Visit from 04/06/2017 in [facility] Office Visit from 04/03/2016 in [facility] Office Visit from 10/19/2015 in [facility] Office Visit from 01/03/2015 in [facility] at [facility]  PHQ-2 Total Score  0  0  0  0  0  PHQ-9 Total Score  0  0  0  -  -       Assessment and Plan: Polar disorder type I.  Anxiety.  Patient is doing much better since department is changed.  He is able to handle his stress much better than he anticipated.  We will reduce hydroxyzine 50 mg to 10 mg tablet and he can take 1 to 2 tablet as needed for anxiety.  Continue Zyprexa 20 mg daily, Ativan 0.5 mg as needed for severe anxiety attack and Zoloft 150 mg daily.  Recommended to continue exercise and walking and discussed healthy lifestyle.  He had appointment to see Dr. bruit next week and I recommended to keep that appointment for physical and blood work.  Recommended to call us back if is any question or any concern.  He has no tremors, shakes or any EPS.  I will see him again in 3 months.   Kathlee Nations, MD 04/07/2018, 8:26 AM

## 2018-04-12 ENCOUNTER — Ambulatory Visit (INDEPENDENT_AMBULATORY_CARE_PROVIDER_SITE_OTHER): Payer: BLUE CROSS/BLUE SHIELD | Admitting: Family Medicine

## 2018-04-12 ENCOUNTER — Encounter: Payer: Self-pay | Admitting: General Practice

## 2018-04-12 ENCOUNTER — Encounter: Payer: Self-pay | Admitting: Family Medicine

## 2018-04-12 ENCOUNTER — Other Ambulatory Visit: Payer: Self-pay

## 2018-04-12 VITALS — BP 121/78 | HR 76 | Temp 98.5°F | Resp 17 | Ht 68.0 in | Wt 179.2 lb

## 2018-04-12 DIAGNOSIS — E785 Hyperlipidemia, unspecified: Secondary | ICD-10-CM | POA: Diagnosis not present

## 2018-04-12 DIAGNOSIS — Z125 Encounter for screening for malignant neoplasm of prostate: Secondary | ICD-10-CM

## 2018-04-12 DIAGNOSIS — Z0000 Encounter for general adult medical examination without abnormal findings: Secondary | ICD-10-CM

## 2018-04-12 LAB — CBC WITH DIFFERENTIAL/PLATELET
Basophils Absolute: 0 10*3/uL (ref 0.0–0.1)
Basophils Relative: 0.3 % (ref 0.0–3.0)
Eosinophils Absolute: 0.1 10*3/uL (ref 0.0–0.7)
Eosinophils Relative: 1.8 % (ref 0.0–5.0)
HCT: 40.6 % (ref 39.0–52.0)
Hemoglobin: 13.8 g/dL (ref 13.0–17.0)
LYMPHS ABS: 2.2 10*3/uL (ref 0.7–4.0)
Lymphocytes Relative: 27 % (ref 12.0–46.0)
MCHC: 33.9 g/dL (ref 30.0–36.0)
MCV: 89.7 fl (ref 78.0–100.0)
Monocytes Absolute: 0.7 10*3/uL (ref 0.1–1.0)
Monocytes Relative: 8.1 % (ref 3.0–12.0)
Neutro Abs: 5.1 10*3/uL (ref 1.4–7.7)
Neutrophils Relative %: 62.8 % (ref 43.0–77.0)
Platelets: 289 10*3/uL (ref 150.0–400.0)
RBC: 4.53 Mil/uL (ref 4.22–5.81)
RDW: 15.3 % (ref 11.5–15.5)
WBC: 8.1 10*3/uL (ref 4.0–10.5)

## 2018-04-12 LAB — HEPATIC FUNCTION PANEL
ALT: 18 U/L (ref 0–53)
AST: 15 U/L (ref 0–37)
Albumin: 4.4 g/dL (ref 3.5–5.2)
Alkaline Phosphatase: 53 U/L (ref 39–117)
Bilirubin, Direct: 0.1 mg/dL (ref 0.0–0.3)
Total Bilirubin: 0.3 mg/dL (ref 0.2–1.2)
Total Protein: 6.8 g/dL (ref 6.0–8.3)

## 2018-04-12 LAB — BASIC METABOLIC PANEL
BUN: 17 mg/dL (ref 6–23)
CO2: 24 mEq/L (ref 19–32)
CREATININE: 1.17 mg/dL (ref 0.40–1.50)
Calcium: 9.9 mg/dL (ref 8.4–10.5)
Chloride: 107 mEq/L (ref 96–112)
GFR: 69.23 mL/min (ref >60.00)
Glucose, Bld: 90 mg/dL (ref 70–99)
Potassium: 4.5 mEq/L (ref 3.5–5.1)
Sodium: 141 mEq/L (ref 135–145)

## 2018-04-12 LAB — LIPID PANEL
Cholesterol: 215 mg/dL — ABNORMAL HIGH (ref 0–200)
HDL: 24.7 mg/dL — ABNORMAL LOW (ref >39.00)
NonHDL: 190.46
Total CHOL/HDL Ratio: 9
Triglycerides: 233 mg/dL — ABNORMAL HIGH (ref 0.0–149.0)
VLDL: 46.6 mg/dL — ABNORMAL HIGH (ref 0.0–40.0)

## 2018-04-12 LAB — TSH: TSH: 3.03 u[IU]/mL (ref 0.35–4.50)

## 2018-04-12 LAB — LDL CHOLESTEROL, DIRECT: Direct LDL: 160 mg/dL

## 2018-04-12 LAB — PSA: PSA: 0.67 ng/mL (ref 0.10–4.00)

## 2018-04-12 MED ORDER — ATORVASTATIN CALCIUM 40 MG PO TABS: ORAL_TABLET | ORAL | 1 refills | Status: DC

## 2018-04-12 MED ORDER — FENOFIBRATE 160 MG PO TABS: 160.0000 mg | ORAL_TABLET | Freq: Every day | ORAL | 1 refills | Status: DC

## 2018-04-12 NOTE — Patient Instructions (Signed)
Follow up in 6 months to recheck cholesterol We'll notify you of your lab results and make any changes if needed Continue to work on healthy diet and regular exercise- you look great! Try and quit smoking- you can do it! Call with any questions or concerns Happy New Year!!

## 2018-04-12 NOTE — Assessment & Plan Note (Signed)
Pt's PE WNL.  UTD on colonoscopy, immunizations.  Check labs.  Anticipatory guidance provided.  

## 2018-04-12 NOTE — Progress Notes (Signed)
   Subjective:    Patient ID: Timothy Burnett, male    DOB: 09/22/1965, 53 y.o.   MRN: [id number]  HPI CPE- UTD on immunizations, colonoscopy.   Review of Systems Patient reports no vision/hearing changes, anorexia, fever ,adenopathy, persistant/recurrent hoarseness, swallowing issues, chest pain, palpitations, edema, persistant/recurrent cough, hemoptysis, dyspnea (rest,exertional, paroxysmal nocturnal), gastrointestinal  bleeding (melena, rectal bleeding), abdominal pain, excessive heart burn, GU symptoms (dysuria, hematuria, voiding/incontinence issues) syncope, focal weakness, memory loss, numbness & tingling, skin/hair/nail changes, depression, anxiety, abnormal bruising/bleeding, musculoskeletal symptoms/signs.     Objective:   Physical Exam BP 121/78   Pulse 76   Temp 98.5 F (36.9 C) (Oral)   Resp 17   Ht 5\' 8"  (1.727 m)   Wt 179 lb 4 oz (81.3 kg)   SpO2 98%   BMI 27.25 kg/m   General Appearance:    Alert, cooperative, no distress, appears stated age  Head:    Normocephalic, without obvious abnormality, atraumatic  Eyes:    PERRL, conjunctiva/corneas clear, EOM's intact, fundi    benign, both eyes       Ears:    Normal TM's and external ear canals, both ears  Nose:   Nares normal, septum midline, mucosa normal, no drainage   or sinus tenderness  Throat:   Lips, mucosa, and tongue normal; teeth and gums normal  Neck:   Supple, symmetrical, trachea midline, no adenopathy;       thyroid:  No enlargement/tenderness/nodules  Back:     Symmetric, no curvature, ROM normal, no CVA tenderness  Lungs:     Clear to auscultation bilaterally, respirations unlabored  Chest wall:    No tenderness or deformity  Heart:    Regular rate and rhythm, S1 and S2 normal, no murmur, rub   or gallop  Abdomen:     Soft, non-tender, bowel sounds active all four quadrants,    no masses, no organomegaly  Genitalia:    Normal male without lesion, discharge or tenderness  Rectal:    Normal tone, normal  prostate, no masses or tenderness  Extremities:   Extremities normal, atraumatic, no cyanosis or edema  Pulses:   2+ and symmetric all extremities  Skin:   Skin color, texture, turgor normal, no rashes or lesions  Lymph nodes:   Cervical, supraclavicular, and axillary nodes normal  Neurologic:   CNII-XII intact. Normal strength, sensation and reflexes      throughout          Assessment & Plan:

## 2018-04-12 NOTE — Assessment & Plan Note (Signed)
Chronic problem, tolerating statin w/o difficulty.  Check labs.  Adjust meds prn  

## 2018-06-02 ENCOUNTER — Other Ambulatory Visit (HOSPITAL_COMMUNITY): Payer: Self-pay | Admitting: Psychiatry

## 2018-06-02 DIAGNOSIS — F419 Anxiety disorder, unspecified: Secondary | ICD-10-CM

## 2018-06-07 ENCOUNTER — Telehealth (HOSPITAL_COMMUNITY): Payer: Self-pay

## 2018-06-07 DIAGNOSIS — F419 Anxiety disorder, unspecified: Secondary | ICD-10-CM

## 2018-06-07 MED ORDER — HYDROXYZINE HCL 10 MG PO TABS: ORAL_TABLET | ORAL | 0 refills | Status: DC

## 2018-06-07 NOTE — Telephone Encounter (Signed)
Medication refill request - Fax from pt's [facility] requesting a refill of his prescribed Hydroxyzine, last ordered 04/07/18 and pt returns next on 07/08/18.

## 2018-06-07 NOTE — Telephone Encounter (Signed)
Prescription done

## 2018-06-13 ENCOUNTER — Encounter: Payer: Self-pay | Admitting: Emergency Medicine

## 2018-06-13 ENCOUNTER — Other Ambulatory Visit: Payer: Self-pay

## 2018-06-13 ENCOUNTER — Ambulatory Visit
Admission: EM | Admit: 2018-06-13 | Discharge: 2018-06-13 | Disposition: A | Discharge status: Home / Self Care | Payer: BLUE CROSS/BLUE SHIELD | Attending: Family Medicine | Admitting: Family Medicine

## 2018-06-13 DIAGNOSIS — J302 Other seasonal allergic rhinitis: Secondary | ICD-10-CM

## 2018-06-13 DIAGNOSIS — R05 Cough: Secondary | ICD-10-CM

## 2018-06-13 DIAGNOSIS — R059 Cough, unspecified: Secondary | ICD-10-CM

## 2018-06-13 MED ORDER — IPRATROPIUM BROMIDE 0.06 % NA SOLN: 2.0000 | Freq: Four times a day (QID) | NASAL | 0 refills | Status: DC | PRN

## 2018-06-13 MED ORDER — CETIRIZINE-PSEUDOEPHEDRINE ER 5-120 MG PO TB12: 1.0000 | ORAL_TABLET | Freq: Two times a day (BID) | ORAL | 0 refills | Status: DC

## 2018-06-13 MED ORDER — BENZONATATE 100 MG PO CAPS: 100.0000 mg | ORAL_CAPSULE | Freq: Three times a day (TID) | ORAL | 0 refills | Status: DC | PRN

## 2018-06-13 NOTE — ED Triage Notes (Signed)
Patient c/o sinus congestion and pressure, and cough that started 2 days ago.  Patient denies fevers.  Patient traveled on a cruise to [place].  Patient states that it was a 4 day cruise that left on Feb. 27 and returned on March 1 to [place], [place].

## 2018-06-13 NOTE — ED Provider Notes (Signed)
[facility]    CSN: [id number] Arrival date & time: 06/13/18  4081   History   Chief Complaint Chief Complaint  Patient presents with  . Cough  . Sinus Problem   HPI  53 year old male presents with the above complaints.  Patient reports that his symptoms started 2 days ago.  Patient reports that this happens every year at the beginning of spring.  He reports cough and congestion.  Denies fever.  No other respiratory symptoms.  Patient believes that he has a sinus infection.  No known relieving factors.  No other associated symptoms.  No other complaints.  Of note, patient recently traveled on a cruise to the [place].  He returned on March 1.  PMH, Surgical Hx, Family Hx, Social History reviewed and updated as below.  Past Medical History:  Diagnosis Date  . Bipolar disorder ([place])   . Depression   . Hyperlipemia   . Ulcer    stomach   Patient Active Problem List   Diagnosis Date Noted  . Physical exam 04/03/2016  . Hidradenitis suppurativa of right axilla 09/20/2015  . Hyperlipidemia 06/11/2012   Past Surgical History:  Procedure Laterality Date  . feet surgery     bone spurs  . FOOT SURGERY  2012   [place]  . TONSILLECTOMY      Home Medications    Prior to Admission medications   Medication Sig Start Date End Date Taking? Authorizing Provider  atorvastatin (LIPITOR) 40 MG tablet TAKE 1 TABLET BY MOUTH AT BEDTIME 04/12/18  Yes Midge Minium, MD  B Complex Vitamins (B COMPLEX PO) Take by mouth.   Yes [provider]  fenofibrate 160 MG tablet Take 1 tablet (160 mg total) by mouth daily. 04/12/18  Yes Midge Minium, MD  hydrOXYzine (ATARAX/VISTARIL) 10 MG tablet Take 1-2 tab as needed for anxiety 06/07/18  Yes Arfeen, Arlyce Harman, MD  lamoTRIgine (LAMICTAL) 150 MG tablet Take 1 tablet (150 mg total) by mouth 2 (two) times daily. 04/07/18  Yes Arfeen, Arlyce Harman, MD  LORazepam (ATIVAN) 0.5 MG tablet Take 1 tablet (0.5 mg total) by mouth daily as  needed for anxiety. 04/07/18 04/07/19 Yes Arfeen, Arlyce Harman, MD  Multiple Vitamin (MULTIVITAMIN) tablet Take 1 tablet by mouth daily.   Yes [provider]  OLANZapine (ZYPREXA) 20 MG tablet Take 1 tablet (20 mg total) by mouth at bedtime. 04/07/18  Yes Arfeen, Arlyce Harman, MD  Omega-3 Fatty Acids (FISH OIL PO) Take by mouth.   Yes [provider]  sertraline (ZOLOFT) 100 MG tablet Take 1.5 tablets (150 mg total) by mouth daily. 04/07/18  Yes Arfeen, Arlyce Harman, MD  benzonatate (TESSALON) 100 MG capsule Take 1 capsule (100 mg total) by mouth 3 (three) times daily as needed. 06/13/18   Coral Spikes, DO  cetirizine-pseudoephedrine (ZYRTEC-D) 5-120 MG tablet Take 1 tablet by mouth 2 (two) times daily. 06/13/18   Thersa Salt G, DO  ipratropium (ATROVENT) 0.06 % nasal spray Place 2 sprays into both nostrils 4 (four) times daily as needed for rhinitis. 06/13/18   Coral Spikes, DO    Family History Family History  Problem Relation Age of Onset  . Hyperlipidemia Mother   . Heart disease Mother   . Hypertension Mother   . Hyperlipidemia Father     Social History Social History   Tobacco Use  . Smoking status: Never Smoker  . Smokeless tobacco: Never Used  Substance Use Topics  . Alcohol use:  No    Alcohol/week: 0.0 standard drinks    Comment: Occasional   . Drug use: No     Allergies   Penicillins   Review of Systems Review of Systems  Constitutional: Negative for fever.  HENT: Positive for congestion.   Respiratory: Positive for cough.    Physical Exam Triage Vital Signs ED Triage Vitals  Enc Vitals Group     BP 06/13/18 0857 123/75     Pulse Rate 06/13/18 0857 90     Resp 06/13/18 0857 16     Temp 06/13/18 0857 98.7 F (37.1 C)     Temp Source 06/13/18 0857 Oral     SpO2 06/13/18 0857 99 %     Weight 06/13/18 0854 175 lb (79.4 kg)     Height 06/13/18 0854 5' 8.5" (1.74 m)     Head Circumference --      Peak Flow --      Pain Score 06/13/18 0854 4     Pain Loc --       Pain Edu? --      Excl. in [place]? --    Updated Vital Signs BP 123/75 (BP Location: Left Arm)   Pulse 90   Temp 98.7 F (37.1 C) (Oral)   Resp 16   Ht 5' 8.5" (1.74 m)   Wt 79.4 kg   SpO2 99%   BMI 26.22 kg/m   Visual Acuity Right Eye Distance:   Left Eye Distance:   Bilateral Distance:    Right Eye Near:   Left Eye Near:    Bilateral Near:     Physical Exam Vitals signs and nursing note reviewed.  Constitutional:      General: He is not in acute distress.    Appearance: Normal appearance.  HENT:     Head: Normocephalic and atraumatic.     Right Ear: Tympanic membrane normal.     Left Ear: Tympanic membrane normal.     Mouth/Throat:     Pharynx: Oropharynx is clear. No oropharyngeal exudate.  Eyes:     General:        Right eye: No discharge.        Left eye: No discharge.     Conjunctiva/sclera: Conjunctivae normal.  Cardiovascular:     Rate and Rhythm: Normal rate and regular rhythm.  Pulmonary:     Effort: Pulmonary effort is normal.     Breath sounds: Normal breath sounds.  Neurological:     Mental Status: He is alert.  Psychiatric:        Behavior: Behavior normal.     Comments: Flat affect.    UC Treatments / Results  Labs (all labs ordered are listed, but only abnormal results are displayed) Labs Reviewed - No data to display  EKG None  Radiology No results found.  Procedures Procedures (including critical care time)  Medications Ordered in UC Medications - No data to display  Initial Impression / Assessment and Plan / UC Course  I have reviewed the triage vital signs and the nursing notes.  Pertinent labs & imaging results that were available during my care of the patient were reviewed by me and considered in my medical decision making (see chart for details).    53 year old male presents with allergic rhinitis and associated cough.  Treating with Zyrtec-D, Atrovent nasal spray, Tessalon Perles.  Final Clinical Impressions(s) / UC  Diagnoses   Final diagnoses:  Seasonal allergic rhinitis, unspecified trigger  Cough     Discharge  Instructions     Medications as prescribed.  You can use Afrin briefly (2 days or less) if needed.  Take care  Dr. Lacinda Axon    ED Prescriptions    Medication Sig Dispense Auth. Provider   cetirizine-pseudoephedrine (ZYRTEC-D) 5-120 MG tablet Take 1 tablet by mouth 2 (two) times daily. 30 tablet Drezden Seitzinger G, DO   ipratropium (ATROVENT) 0.06 % nasal spray Place 2 sprays into both nostrils 4 (four) times daily as needed for rhinitis. 15 mL Zayley Arras G, DO   benzonatate (TESSALON) 100 MG capsule Take 1 capsule (100 mg total) by mouth 3 (three) times daily as needed. 30 capsule Coral Spikes, DO     Controlled Substance Prescriptions [place] Controlled Substance Registry consulted? Not Applicable   Coral Spikes, [place] 06/13/18 9223

## 2018-06-13 NOTE — Discharge Instructions (Signed)
Medications as prescribed.  You can use Afrin briefly (2 days or less) if needed.  Take care  Dr. Lacinda Axon

## 2018-06-15 ENCOUNTER — Other Ambulatory Visit: Payer: Self-pay | Admitting: Family Medicine

## 2018-07-08 ENCOUNTER — Other Ambulatory Visit: Payer: Self-pay

## 2018-07-08 ENCOUNTER — Ambulatory Visit (INDEPENDENT_AMBULATORY_CARE_PROVIDER_SITE_OTHER): Payer: BLUE CROSS/BLUE SHIELD | Admitting: Psychiatry

## 2018-07-08 DIAGNOSIS — F319 Bipolar disorder, unspecified: Secondary | ICD-10-CM

## 2018-07-08 DIAGNOSIS — F419 Anxiety disorder, unspecified: Secondary | ICD-10-CM

## 2018-07-08 MED ORDER — OLANZAPINE 20 MG PO TABS: 20.0000 mg | ORAL_TABLET | Freq: Every day | ORAL | 0 refills | Status: DC

## 2018-07-08 MED ORDER — LAMOTRIGINE 150 MG PO TABS: 150.0000 mg | ORAL_TABLET | Freq: Two times a day (BID) | ORAL | 0 refills | Status: DC

## 2018-07-08 MED ORDER — SERTRALINE HCL 100 MG PO TABS: 150.0000 mg | ORAL_TABLET | Freq: Every day | ORAL | 0 refills | Status: DC

## 2018-07-08 NOTE — Progress Notes (Signed)
Virtual Visit via Telephone Note  I connected with Timothy Burnett on 07/08/18 at  8:20 AM EDT by telephone and verified that I am speaking with the correct person using two identifiers.   I discussed the limitations, risks, security and privacy concerns of performing an evaluation and management service by telephone and the availability of in person appointments. I also discussed with the patient that there may be a patient responsible charge related to this service. The patient expressed understanding and agreed to proceed.   History of Present Illness: Patient was evaluated through phone session.  He is currently unemployed.  He quit working for [employer] and he had a job offer at [employer] but due to pandemic coronavirus is offered at [employer] was resended.  He is disappointed but he has not given hope.  Patient told he can always go back to [place].  Overall he describes his mood is okay.  He is not irritable.  He denies any mania or any psychosis.  He is moved in with his girlfriend and things are going well.  His relationship is supportive with a girlfriend.  Recently he has seen primary care physician and he has blood work.  He has hyperlipidemia.  He noticed since not working at [employer] his stress level is not as bad.  He does not get irritable or angry.  He is sleeping good.  Is trying to keep him busy and continue to exercise and walk whenever he get a chance.  Denies any paranoia or any hallucination.  He denies any rash or any itching.  He has not taken Ativan in past few months.  He like to continue his other medication.  Past Psychiatric History: Reviewed. History of mania and impulsive behavior.  No history of psychiatric inpatient treatment or any suicidal attempt.  Tried Abilify and Geodon with poor outcome.    Recent Results (from the past 2160 hour(s))  Lipid panel     Status: Abnormal   Collection Time: 04/12/18  9:16 AM  Result Value Ref Range   Cholesterol 215 (H) 0 - 200 mg/dL   Comment: ATP III Classification       Desirable:  < 200 mg/dL               Borderline High:  200 - 239 mg/dL          High:  > = 240 mg/dL   Triglycerides 233.0 (H) 0.0 - 149.0 mg/dL    Comment: Normal:  <150 mg/dLBorderline High:  150 - 199 mg/dL   HDL 24.70 (L) >39.00 mg/dL   VLDL 46.6 (H) 0.0 - 40.0 mg/dL   Total CHOL/HDL Ratio 9     Comment:                Men          Women1/2 Average Risk     3.4          3.3Average Risk          5.0          4.42X Average Risk          9.6          7.13X Average Risk          15.0          11.0                       NonHDL 190.46     Comment: NOTE:  Non-HDL  goal should be 30 mg/dL higher than patient's LDL goal (i.e. LDL goal of < 70 mg/dL, would have non-HDL goal of < 100 mg/dL)  Basic metabolic panel     Status: None   Collection Time: 04/12/18  9:16 AM  Result Value Ref Range   Sodium 141 135 - 145 mEq/L   Potassium 4.5 3.5 - 5.1 mEq/L   Chloride 107 96 - 112 mEq/L   CO2 24 19 - 32 mEq/L   Glucose, Bld 90 70 - 99 mg/dL   BUN 17 6 - 23 mg/dL   Creatinine, Ser 1.17 0.40 - 1.50 mg/dL   Calcium 9.9 8.4 - 10.5 mg/dL   GFR 69.23 >60.00 mL/min  Hepatic function panel     Status: None   Collection Time: 04/12/18  9:16 AM  Result Value Ref Range   Total Bilirubin 0.3 0.2 - 1.2 mg/dL   Bilirubin, Direct 0.1 0.0 - 0.3 mg/dL   Alkaline Phosphatase 53 39 - 117 U/L   AST 15 0 - 37 U/L   ALT 18 0 - 53 U/L   Total Protein 6.8 6.0 - 8.3 g/dL   Albumin 4.4 3.5 - 5.2 g/dL  TSH     Status: None   Collection Time: 04/12/18  9:16 AM  Result Value Ref Range   TSH 3.03 0.35 - 4.50 uIU/mL  CBC with Differential/Platelet     Status: None   Collection Time: 04/12/18  9:16 AM  Result Value Ref Range   WBC 8.1 4.0 - 10.5 K/uL   RBC 4.53 4.22 - 5.81 Mil/uL   Hemoglobin 13.8 13.0 - 17.0 g/dL   HCT 40.6 39.0 - 52.0 %   MCV 89.7 78.0 - 100.0 fl   MCHC 33.9 30.0 - 36.0 g/dL   RDW 15.3 11.5 - 15.5 %   Platelets 289.0 150.0 - 400.0 K/uL   Neutrophils Relative %  62.8 43.0 - 77.0 %   Lymphocytes Relative 27.0 12.0 - 46.0 %   Monocytes Relative 8.1 3.0 - 12.0 %   Eosinophils Relative 1.8 0.0 - 5.0 %   Basophils Relative 0.3 0.0 - 3.0 %   Neutro Abs 5.1 1.4 - 7.7 K/uL   Lymphs Abs 2.2 0.7 - 4.0 K/uL   Monocytes Absolute 0.7 0.1 - 1.0 K/uL   Eosinophils Absolute 0.1 0.0 - 0.7 K/uL   Basophils Absolute 0.0 0.0 - 0.1 K/uL  PSA     Status: None   Collection Time: 04/12/18  9:16 AM  Result Value Ref Range   PSA 0.67 0.10 - 4.00 ng/mL    Comment: Test performed using Access [employer] PSA Assay, a parmagnetic partical, chemiluminecent immunoassay.  LDL cholesterol, direct     Status: None   Collection Time: 04/12/18  9:16 AM  Result Value Ref Range   Direct LDL 160.0 mg/dL    Comment: Optimal:  <100 mg/dLNear or Above Optimal:  100-129 mg/dLBorderline High:  130-159 mg/dLHigh:  160-189 mg/dLVery High:  >190 mg/dL    Observations/Objective: Limited mental status admission done on the phone.  Patient describes his mood okay.  His speech is clear, coherent with normal tone and volume.  His thought process logical and goal-directed.  There were no paranoia, delusion, auditory hallucination or any mania.  He denies any active or passive suicidal thoughts or homicidal thought.  He does not appear to be distracted on the phone.  He is alert and oriented x3.  His cognition is intact.  His fund of knowledge is good.  His insight and judgment is okay.  Assessment and Plan: Bipolar disorder type I.  Anxiety.  Patient is doing better on his medication.  Currently he is unemployed but looking for a job.  His living situation is good.  He moved in with his girlfriend and his condominium is on sale.  I reviewed his blood work results.  Patient reported no side effects including any rash, itching, tremors or shakes.  He does not need Ativan.  Continue Zyprexa 20 mg daily, Zoloft 150 mg daily and Lamictal 150 mg twice a day.  Discussed medication side effects and benefits.   Recommended to call us back if he has any question or any concern.  Follow-up in 3 months.  Follow Up Instructions:    I discussed the assessment and treatment plan with the patient. The patient was provided an opportunity to ask questions and all were answered. The patient agreed with the plan and demonstrated an understanding of the instructions.   The patient was advised to call back or seek an in-person evaluation if the symptoms worsen or if the condition fails to improve as anticipated.  I provided 20 minutes of non-face-to-face time during this encounter.   Kathlee Nations, MD

## 2018-09-30 ENCOUNTER — Other Ambulatory Visit (HOSPITAL_COMMUNITY): Payer: Self-pay

## 2018-09-30 ENCOUNTER — Other Ambulatory Visit: Payer: Self-pay

## 2018-09-30 DIAGNOSIS — F319 Bipolar disorder, unspecified: Secondary | ICD-10-CM

## 2018-09-30 MED ORDER — ATORVASTATIN CALCIUM 40 MG PO TABS: 40.0000 mg | ORAL_TABLET | Freq: Every day | ORAL | 0 refills | Status: DC

## 2018-09-30 MED ORDER — LAMOTRIGINE 150 MG PO TABS: 150.0000 mg | ORAL_TABLET | Freq: Two times a day (BID) | ORAL | 0 refills | Status: DC

## 2018-09-30 NOTE — Progress Notes (Unsigned)
Patient called for a refill on Lamictal, he was going to run out 4 days before his appointment - his insurance requires a 90 day supply. I sent the order to the pharmacy and confirmed appointment with the patient

## 2018-10-07 ENCOUNTER — Other Ambulatory Visit: Payer: Self-pay

## 2018-10-07 ENCOUNTER — Ambulatory Visit (INDEPENDENT_AMBULATORY_CARE_PROVIDER_SITE_OTHER): Payer: Self-pay | Admitting: Psychiatry

## 2018-10-07 ENCOUNTER — Encounter (HOSPITAL_COMMUNITY): Payer: Self-pay | Admitting: Psychiatry

## 2018-10-07 DIAGNOSIS — F419 Anxiety disorder, unspecified: Secondary | ICD-10-CM

## 2018-10-07 DIAGNOSIS — F319 Bipolar disorder, unspecified: Secondary | ICD-10-CM

## 2018-10-07 MED ORDER — SERTRALINE HCL 100 MG PO TABS: 150.0000 mg | ORAL_TABLET | Freq: Every day | ORAL | 0 refills | Status: DC

## 2018-10-07 MED ORDER — LAMOTRIGINE 150 MG PO TABS: 150.0000 mg | ORAL_TABLET | Freq: Two times a day (BID) | ORAL | 0 refills | Status: DC

## 2018-10-07 MED ORDER — OLANZAPINE 20 MG PO TABS: 20.0000 mg | ORAL_TABLET | Freq: Every day | ORAL | 0 refills | Status: DC

## 2018-10-07 MED ORDER — HYDROXYZINE HCL 10 MG PO TABS: ORAL_TABLET | ORAL | 0 refills | Status: DC

## 2018-10-07 NOTE — Progress Notes (Signed)
Virtual Visit via Telephone Note  I connected with Timothy Burnett on 10/07/18 at  8:20 AM EDT by telephone and verified that I am speaking with the correct person using two identifiers.   I discussed the limitations, risks, security and privacy concerns of performing an evaluation and management service by telephone and the availability of in person appointments. I also discussed with the patient that there may be a patient responsible charge related to this service. The patient expressed understanding and agreed to proceed.   History of Present Illness: Patient was evaluated through phone session.  He is happy because he got a job at [employer] and is been working there for past 5 weeks.  Patient like working there because it is less stressful.  3 weeks ago he moved in with his girlfriend to a new house which they bought.  Patient told things are going very well.  He has not taken hydroxyzine in past few weeks but before he was taking on a regular basis.  He like to have hydroxyzine tablet in case he needed.  He denies any irritability, anger, mania, psychosis, mood swing.  He denies any crying spells or any feeling of hopelessness or worthlessness.  His anxiety level is much under control.  He admitted weight gain since COVID-19 but now he is started exercise and walking and hoping to lose weight.  He denies drinking or using any illegal substances.  He has no rash, itching tremors or shakes.  He is no longer taking Ativan for past 6 months.  Reported his mood is stable and he like to keep his current medication.  He has upcoming appointment with Dr. Birdie Riddle in few weeks.  Past Psychiatric History:Reviewed. History of mania and impulsive behavior. No history of psychiatric inpatient treatment or any suicidal attempt. Tried Abilify and Geodon with poor outcome.   Psychiatric Specialty Exam: Physical Exam  ROS  There were no vitals taken for this visit.There is no height or weight on  file to calculate BMI.  General Appearance: NA  Eye Contact:  NA  Speech:  Clear and Coherent  Volume:  Normal  Mood:  Euthymic  Affect:  NA  Thought Process:  Coherent and Goal Directed  Orientation:  Full (Time, Place, and Person)  Thought Content:  WDL and Logical  Suicidal Thoughts:  No  Homicidal Thoughts:  No  Memory:  Immediate;   Good Recent;   Good Remote;   Good  Judgement:  Good  Insight:  Good  Psychomotor Activity:  NA  Concentration:  Concentration: Good and Attention Span: Good  Recall:  Good  Fund of Knowledge:  Good  Language:  Good  Akathisia:  No  Handed:  Right  AIMS (if indicated):     Assets:  Communication Skills Desire for Improvement Housing Resilience Social Support Talents/Skills Transportation  ADL's:  Intact  Cognition:  WNL  Sleep:   good      Assessment and Plan: Bipolar disorder type I.  Anxiety.  Patient is a stable on his current medication.  Discussed his weight gain however he is working on it and started exercise and walking every day.  He is pleased that he is working and his new job is much better.  Continue Zyprexa 20 mg daily, Zoloft 150 mg daily, Lamictal 150 mg twice a day and hydroxyzine 10 mg as needed for insomnia.  I will discontinue Ativan since he is no longer taking it.  Discussed medication side effects and benefits.  He  has no rash, itching tremors or shakes.  Recommended to keep his appointment with his primary care physician for blood work.  Discussed healthy lifestyle and watch his calorie intake.  Recommended to call us back if he has any question or any concern.  Follow-up in 3 months.  Follow Up Instructions:    I discussed the assessment and treatment plan with the patient. The patient was provided an opportunity to ask questions and all were answered. The patient agreed with the plan and demonstrated an understanding of the instructions.   The patient was advised to call back or seek an in-person evaluation  if the symptoms worsen or if the condition fails to improve as anticipated.  I provided 15 minutes of non-face-to-face time during this encounter.   Kathlee Nations, MD

## 2018-10-12 ENCOUNTER — Other Ambulatory Visit: Payer: Self-pay

## 2018-10-12 ENCOUNTER — Encounter: Payer: Self-pay | Admitting: Family Medicine

## 2018-10-12 ENCOUNTER — Ambulatory Visit (INDEPENDENT_AMBULATORY_CARE_PROVIDER_SITE_OTHER): Payer: PRIVATE HEALTH INSURANCE | Admitting: Family Medicine

## 2018-10-12 VITALS — Ht 68.5 in | Wt 185.0 lb

## 2018-10-12 DIAGNOSIS — E785 Hyperlipidemia, unspecified: Secondary | ICD-10-CM | POA: Diagnosis not present

## 2018-10-12 NOTE — Progress Notes (Signed)
I have discussed the procedure for the virtual visit with the patient who has given consent to proceed with assessment and treatment.   Unable to obtain vitals  Jessica L Brodmerkel, CMA     

## 2018-10-12 NOTE — Progress Notes (Signed)
   Virtual Visit via Video   I connected with patient on 10/12/18 at  9:40 AM EDT by a video enabled telemedicine application and verified that I am speaking with the correct person using two identifiers.  Location patient: Home Location provider: [facility], Office Persons participating in the virtual visit: Patient, Provider, [place]  I discussed the limitations of evaluation and management by telemedicine and the availability of in person appointments. The patient expressed understanding and agreed to proceed.  Subjective:   HPI:   Hyperlipidemia- chronic problem.  On Lipitor 40mg  and Fenofibrate 160mg  daily.  Walking regularly.  No CP, SOB, HAs, abd pain, N/V, myalgias.  Pt admits to quarantine snacking.  Pt states he 'is up to 185.  The quarantine 14'.  ROS:   See pertinent positives and negatives per HPI.  Patient Active Problem List   Diagnosis Date Noted  . Physical exam 04/03/2016  . Hidradenitis suppurativa of right axilla 09/20/2015  . Hyperlipidemia 06/11/2012    Social History   Tobacco Use  . Smoking status: Never Smoker  . Smokeless tobacco: Never Used  Substance Use Topics  . Alcohol use: No    Alcohol/week: 0.0 standard drinks    Comment: Occasional     Current Outpatient Medications:  .  atorvastatin (LIPITOR) 40 MG tablet, Take 1 tablet (40 mg total) by mouth at bedtime., Disp: 30 tablet, Rfl: 0 .  B Complex Vitamins (B COMPLEX PO), Take by mouth., Disp: , Rfl:  .  cetirizine-pseudoephedrine (ZYRTEC-D) 5-120 MG tablet, Take 1 tablet by mouth 2 (two) times daily., Disp: 30 tablet, Rfl: 0 .  fenofibrate 160 MG tablet, Take 1 tablet by mouth once daily, Disp: 90 tablet, Rfl: 0 .  hydrOXYzine (ATARAX/VISTARIL) 10 MG tablet, Take 1-2 tab as needed for anxiety, Disp: 45 tablet, Rfl: 0 .  ipratropium (ATROVENT) 0.06 % nasal spray, Place 2 sprays into both nostrils 4 (four) times daily as needed for rhinitis., Disp: 15 mL, Rfl: 0 .  lamoTRIgine  (LAMICTAL) 150 MG tablet, Take 1 tablet (150 mg total) by mouth 2 (two) times daily., Disp: 180 tablet, Rfl: 0 .  Multiple Vitamin (MULTIVITAMIN) tablet, Take 1 tablet by mouth daily., Disp: , Rfl:  .  OLANZapine (ZYPREXA) 20 MG tablet, Take 1 tablet (20 mg total) by mouth at bedtime., Disp: 90 tablet, Rfl: 0 .  Omega-3 Fatty Acids (FISH OIL PO), Take by mouth., Disp: , Rfl:  .  sertraline (ZOLOFT) 100 MG tablet, Take 1.5 tablets (150 mg total) by mouth daily., Disp: 135 tablet, Rfl: 0  Allergies  Allergen Reactions  . Penicillins Rash    Objective:   There were no vitals taken for this visit. AAOx3, NAD NCAT, EOMI No obvious CN deficits Coloring WNL Pt is able to speak clearly, coherently without shortness of breath or increased work of breathing.  Thought process is linear.  Mood is appropriate.   Assessment and Plan:   Hyperlipidemia- chronic problem.  Tolerating statin w/o difficulty.  Encouraged healthy diet and regular exercise as pt has gained 6 lbs.  Check labs.  Adjust meds prn   Annye Asa, MD 10/12/2018

## 2018-10-13 ENCOUNTER — Other Ambulatory Visit (INDEPENDENT_AMBULATORY_CARE_PROVIDER_SITE_OTHER): Payer: PRIVATE HEALTH INSURANCE

## 2018-10-13 ENCOUNTER — Other Ambulatory Visit: Payer: Self-pay

## 2018-10-13 DIAGNOSIS — E785 Hyperlipidemia, unspecified: Secondary | ICD-10-CM

## 2018-10-13 LAB — LIPID PANEL
Cholesterol: 193 mg/dL (ref 0–200)
HDL: 27 mg/dL — ABNORMAL LOW (ref >39.00)
LDL Cholesterol: 129 mg/dL — ABNORMAL HIGH (ref 0–99)
NonHDL: 166.09
Total CHOL/HDL Ratio: 7
Triglycerides: 185 mg/dL — ABNORMAL HIGH (ref 0.0–149.0)
VLDL: 37 mg/dL (ref 0.0–40.0)

## 2018-10-13 LAB — BASIC METABOLIC PANEL
BUN: 20 mg/dL (ref 6–23)
CO2: 24 mEq/L (ref 19–32)
Calcium: 9.2 mg/dL (ref 8.4–10.5)
Chloride: 106 mEq/L (ref 96–112)
Creatinine, Ser: 1.14 mg/dL (ref 0.40–1.50)
GFR: 66.99 mL/min (ref >60.00)
Glucose, Bld: 89 mg/dL (ref 70–99)
Potassium: 4.1 mEq/L (ref 3.5–5.1)
Sodium: 140 mEq/L (ref 135–145)

## 2018-10-13 LAB — HEPATIC FUNCTION PANEL
ALT: 22 U/L (ref 0–53)
AST: 17 U/L (ref 0–37)
Albumin: 4.4 g/dL (ref 3.5–5.2)
Alkaline Phosphatase: 58 U/L (ref 39–117)
Bilirubin, Direct: 0.1 mg/dL (ref 0.0–0.3)
Total Bilirubin: 0.3 mg/dL (ref 0.2–1.2)
Total Protein: 6.4 g/dL (ref 6.0–8.3)

## 2018-10-13 LAB — TSH: TSH: 2.48 u[IU]/mL (ref 0.35–4.50)

## 2018-10-14 ENCOUNTER — Other Ambulatory Visit: Payer: Self-pay | Admitting: Family Medicine

## 2018-10-14 MED ORDER — FENOFIBRATE 160 MG PO TABS: 160.0000 mg | ORAL_TABLET | Freq: Every day | ORAL | 0 refills | Status: DC

## 2018-11-04 ENCOUNTER — Other Ambulatory Visit: Payer: Self-pay | Admitting: Family Medicine

## 2018-11-04 MED ORDER — ATORVASTATIN CALCIUM 40 MG PO TABS: 40.0000 mg | ORAL_TABLET | Freq: Every day | ORAL | 1 refills | Status: DC

## 2018-11-23 DIAGNOSIS — Z20828 Contact with and (suspected) exposure to other viral communicable diseases: Secondary | ICD-10-CM | POA: Diagnosis not present

## 2018-12-31 ENCOUNTER — Other Ambulatory Visit (HOSPITAL_COMMUNITY): Payer: Self-pay

## 2018-12-31 DIAGNOSIS — F319 Bipolar disorder, unspecified: Secondary | ICD-10-CM

## 2018-12-31 MED ORDER — LAMOTRIGINE 150 MG PO TABS: 150.0000 mg | ORAL_TABLET | Freq: Two times a day (BID) | ORAL | 0 refills | Status: DC

## 2019-01-07 ENCOUNTER — Ambulatory Visit (INDEPENDENT_AMBULATORY_CARE_PROVIDER_SITE_OTHER): Payer: Self-pay | Admitting: Psychiatry

## 2019-01-07 ENCOUNTER — Other Ambulatory Visit: Payer: Self-pay

## 2019-01-07 ENCOUNTER — Encounter (HOSPITAL_COMMUNITY): Payer: Self-pay | Admitting: Psychiatry

## 2019-01-07 DIAGNOSIS — F319 Bipolar disorder, unspecified: Secondary | ICD-10-CM

## 2019-01-07 DIAGNOSIS — F419 Anxiety disorder, unspecified: Secondary | ICD-10-CM

## 2019-01-07 MED ORDER — SERTRALINE HCL 100 MG PO TABS: 150.0000 mg | ORAL_TABLET | Freq: Every day | ORAL | 0 refills | Status: DC

## 2019-01-07 MED ORDER — LAMOTRIGINE 150 MG PO TABS: 150.0000 mg | ORAL_TABLET | Freq: Two times a day (BID) | ORAL | 0 refills | Status: DC

## 2019-01-07 MED ORDER — OLANZAPINE 20 MG PO TABS: 20.0000 mg | ORAL_TABLET | Freq: Every day | ORAL | 0 refills | Status: DC

## 2019-01-07 NOTE — Progress Notes (Signed)
Virtual Visit via Telephone Note  I connected with Timothy Burnett on 01/07/19 at  8:20 AM EDT by telephone and verified that I am speaking with the correct person using two identifiers.   I discussed the limitations, risks, security and privacy concerns of performing an evaluation and management service by telephone and the availability of in person appointments. I also discussed with the patient that there may be a patient responsible charge related to this service. The patient expressed understanding and agreed to proceed.   History of Present Illness: Patient was evaluated by phone session.  He is doing well.  He got married 2 weeks ago and things are going well.  He had 10 days off.  He has a ceremony at [employer].  Patient told things are going well.  He denies any irritability, mania, anger, mood swings or any agitation.  He like his new job at [employer] improvement.  Recently he had a blood work.  His labs are normal.  He has no tremors, shakes or any EPS.  We have prescribed hydroxyzine 10 mg tablet which he takes on and off and he feels very anxious and nervous.  Denies drinking or using any illegal substances.  He is no longer taking Ativan.  His drug level is good.  He has no rash or any itching.  Past Psychiatric History:Reviewed. History of mania and impulsive behavior. No history of psychiatric inpatient treatment or any suicidal attempt. Tried Abilify and Geodon with poor outcome.  Recent Results (from the past 2160 hour(s))  TSH     Status: None   Collection Time: 10/13/18  8:36 AM  Result Value Ref Range   TSH 2.48 0.35 - 4.50 uIU/mL  Hepatic function panel     Status: None   Collection Time: 10/13/18  8:36 AM  Result Value Ref Range   Total Bilirubin 0.3 0.2 - 1.2 mg/dL   Bilirubin, Direct 0.1 0.0 - 0.3 mg/dL   Alkaline Phosphatase 58 39 - 117 U/L   AST 17 0 - 37 U/L   ALT 22 0 - 53 U/L   Total Protein 6.4 6.0 - 8.3 g/dL   Albumin 4.4 3.5 - 5.2 g/dL  Basic metabolic  panel     Status: None   Collection Time: 10/13/18  8:36 AM  Result Value Ref Range   Sodium 140 135 - 145 mEq/L   Potassium 4.1 3.5 - 5.1 mEq/L   Chloride 106 96 - 112 mEq/L   CO2 24 19 - 32 mEq/L   Glucose, Bld 89 70 - 99 mg/dL   BUN 20 6 - 23 mg/dL   Creatinine, Ser 1.14 0.40 - 1.50 mg/dL   Calcium 9.2 8.4 - 10.5 mg/dL   GFR 66.99 >60.00 mL/min  Lipid panel     Status: Abnormal   Collection Time: 10/13/18  8:36 AM  Result Value Ref Range   Cholesterol 193 0 - 200 mg/dL    Comment: ATP III Classification       Desirable:  < 200 mg/dL               Borderline High:  200 - 239 mg/dL          High:  > = 240 mg/dL   Triglycerides 185.0 (H) 0.0 - 149.0 mg/dL    Comment: Normal:  <150 mg/dLBorderline High:  150 - 199 mg/dL   HDL 27.00 (L) >39.00 mg/dL   VLDL 37.0 0.0 - 40.0 mg/dL   LDL Cholesterol 129 (H)  0 - 99 mg/dL   Total CHOL/HDL Ratio 7     Comment:                Men          Women1/2 Average Risk     3.4          3.3Average Risk          5.0          4.42X Average Risk          9.6          7.13X Average Risk          15.0          11.0                       NonHDL 166.09     Comment: NOTE:  Non-HDL goal should be 30 mg/dL higher than patient's LDL goal (i.e. LDL goal of < 70 mg/dL, would have non-HDL goal of < 100 mg/dL)      Psychiatric Specialty Exam: Physical Exam  ROS  There were no vitals taken for this visit.There is no height or weight on file to calculate BMI.  General Appearance: NA  Eye Contact:  NA  Speech:  Clear and Coherent and Normal Rate  Volume:  Normal  Mood:  Euthymic  Affect:  NA  Thought Process:  Goal Directed  Orientation:  Full (Time, Place, and Person)  Thought Content:  WDL and Logical  Suicidal Thoughts:  No  Homicidal Thoughts:  No  Memory:  Immediate;   Good Recent;   Good Remote;   Good  Judgement:  Good  Insight:  Present  Psychomotor Activity:  NA  Concentration:  Concentration: Good and Attention Span: Good  Recall:  Good   Fund of Knowledge:  Good  Language:  Good  Akathisia:  No  Handed:  Right  AIMS (if indicated):     Assets:  Communication Skills Desire for [place] Support Talents/Skills Transportation  ADL's:  Intact  Cognition:  WNL  Sleep:   good      Assessment and Plan: Bipolar disorder type I.  Anxiety.  Patient is a stable on his current medication.  I reviewed blood work results.  His CBC and basic chemistry is normal.  His LDL is 129.  His BUN/creatinine and LFTs are normal.  His TSH is normal.  Patient want to keep his current medication.  Continue Zyprexa 20 mg at bedtime, Zoloft 150 mg daily and Lamictal 300 mg daily.  He does not need a new prescription of hydroxyzine at this time.  Discussed medication side effects and benefits.  Encourage healthy lifestyle and start exercising.  Recommended to call us back if is any question of any concern.  Patient is not interested in therapy.  Recommended to call us back if there is any question or any concern.  Follow-up in 3 months.  Follow Up Instructions:    I discussed the assessment and treatment plan with the patient. The patient was provided an opportunity to ask questions and all were answered. The patient agreed with the plan and demonstrated an understanding of the instructions.   The patient was advised to call back or seek an in-person evaluation if the symptoms worsen or if the condition fails to improve as anticipated.  I provided 20 minutes of non-face-to-face time during this encounter.   Kathlee Nations, MD

## 2019-01-12 DIAGNOSIS — H04123 Dry eye syndrome of bilateral lacrimal glands: Secondary | ICD-10-CM | POA: Diagnosis not present

## 2019-01-12 DIAGNOSIS — H40033 Anatomical narrow angle, bilateral: Secondary | ICD-10-CM | POA: Diagnosis not present

## 2019-01-13 ENCOUNTER — Other Ambulatory Visit: Payer: Self-pay | Admitting: Family Medicine

## 2019-01-13 MED ORDER — FENOFIBRATE 160 MG PO TABS: 160.0000 mg | ORAL_TABLET | Freq: Every day | ORAL | 0 refills | Status: DC

## 2019-01-24 ENCOUNTER — Other Ambulatory Visit: Payer: Self-pay

## 2019-01-24 ENCOUNTER — Encounter: Payer: Self-pay | Admitting: Physician Assistant

## 2019-01-24 ENCOUNTER — Ambulatory Visit (INDEPENDENT_AMBULATORY_CARE_PROVIDER_SITE_OTHER): Payer: BC Managed Care – PPO | Admitting: Physician Assistant

## 2019-01-24 ENCOUNTER — Telehealth: Payer: Self-pay

## 2019-01-24 ENCOUNTER — Ambulatory Visit (INDEPENDENT_AMBULATORY_CARE_PROVIDER_SITE_OTHER)
Admission: RE | Admit: 2019-01-24 | Discharge: 2019-01-24 | Disposition: A | Discharge status: Home / Self Care | Payer: BC Managed Care – PPO | Source: Ambulatory Visit | Attending: Physician Assistant | Admitting: Physician Assistant

## 2019-01-24 ENCOUNTER — Other Ambulatory Visit: Payer: Self-pay | Admitting: Emergency Medicine

## 2019-01-24 VITALS — BP 110/60 | HR 83 | Temp 98.7°F | Resp 14 | Ht 68.5 in | Wt 190.0 lb

## 2019-01-24 DIAGNOSIS — M25542 Pain in joints of left hand: Secondary | ICD-10-CM

## 2019-01-24 DIAGNOSIS — M25532 Pain in left wrist: Secondary | ICD-10-CM | POA: Diagnosis not present

## 2019-01-24 DIAGNOSIS — M79642 Pain in left hand: Secondary | ICD-10-CM | POA: Diagnosis not present

## 2019-01-24 NOTE — Patient Instructions (Signed)
Please go to the [facility] office for x-ray. We will call you with your results and alter treatment accordingly.   [place]  [place], [place] [address]  Please wear the splint as directed for the next week. Start Aleve twice daily.  We will alter regimen based on x-ray results. I am going to likely send you to Sports Medicine for the "popping" joint.

## 2019-01-24 NOTE — Progress Notes (Signed)
Patient presents to clinic today c/o left thumb pain x 4 days. States pain is worsening. No injury or fall, no prior issues with left thumb. Pain is located in base of thumb at rest, increases with movement including texting. Positive for swelling, "clicking". Pain radiates to palm of hand. Difficulty holding objects in his left hand. Denies numbness or tingling, pain radiating up arm, pain in other digits. Has not taken any medication for the pain.      Past Medical History:  Diagnosis Date  . Bipolar disorder ([place])   . Depression   . Hyperlipemia   . Ulcer    stomach    Current Outpatient Medications on File Prior to Visit  Medication Sig Dispense Refill  . atorvastatin (LIPITOR) 40 MG tablet Take 1 tablet (40 mg total) by mouth at bedtime. 90 tablet 1  . B Complex Vitamins (B COMPLEX PO) Take by mouth.    . fenofibrate 160 MG tablet Take 1 tablet (160 mg total) by mouth daily. 90 tablet 0  . hydrOXYzine (ATARAX/VISTARIL) 10 MG tablet Take 1-2 tab as needed for anxiety 45 tablet 0  . lamoTRIgine (LAMICTAL) 150 MG tablet Take 1 tablet (150 mg total) by mouth 2 (two) times daily. 180 tablet 0  . Multiple Vitamin (MULTIVITAMIN) tablet Take 1 tablet by mouth daily.    Marland Kitchen OLANZapine (ZYPREXA) 20 MG tablet Take 1 tablet (20 mg total) by mouth at bedtime. 90 tablet 0  . Omega-3 Fatty Acids (FISH OIL PO) Take by mouth.    . sertraline (ZOLOFT) 100 MG tablet Take 1.5 tablets (150 mg total) by mouth daily. 135 tablet 0   No current facility-administered medications on file prior to visit.     Allergies  Allergen Reactions  . Penicillins Rash    Family History  Problem Relation Age of Onset  . Hyperlipidemia Mother   . Heart disease Mother   . Hypertension Mother   . Hyperlipidemia Father     Social History   Socioeconomic History  . Marital status: Married    Spouse name: Not on file  . Number of children: Not on file  . Years of education: Not on file  . Highest education  level: Not on file  Occupational History  . Not on file  Social Needs  . Financial resource strain: Not on file  . Food insecurity    Worry: Not on file    Inability: Not on file  . Transportation needs    Medical: Not on file    Non-medical: Not on file  Tobacco Use  . Smoking status: Never Smoker  . Smokeless tobacco: Never Used  Substance and Sexual Activity  . Alcohol use: No    Alcohol/week: 0.0 standard drinks    Comment: Occasional   . Drug use: No  . Sexual activity: Not Currently  Lifestyle  . Physical activity    Days per week: Not on file    Minutes per session: Not on file  . Stress: Not on file  Relationships  . Social Herbalist on phone: Not on file    Gets together: Not on file    Attends religious service: Not on file    Active member of club or organization: Not on file    Attends meetings of clubs or organizations: Not on file    Relationship status: Not on file  Other Topics Concern  . Not on file  Social History Narrative  . Not on file  Review of Systems - See HPI.  All other ROS are negative.  BP 110/60   Pulse 83   Temp 98.7 F (37.1 C) (Temporal)   Resp 14   Ht 5' 8.5" (1.74 m)   Wt 190 lb (86.2 kg)   SpO2 98%   BMI 28.47 kg/m   Physical Exam HENT:     Head: Normocephalic and atraumatic.  Pulmonary:     Effort: Pulmonary effort is normal.  Musculoskeletal:     Left hand: He exhibits decreased range of motion, tenderness, bony tenderness and swelling. He exhibits normal capillary refill and no deformity. Normal sensation noted. Decreased strength noted.  Skin:    General: Skin is warm and dry.  Neurological:     General: No focal deficit present.     Mental Status: He is alert.  Psychiatric:        Mood and Affect: Mood normal.        Behavior: Behavior normal.    Assessment/Plan: 1. Pain in joint of left hand Pain in IP and MCP of 1st phalanx, IP with popping and concern for start of trigger finger. Also with  some pain with lateral bending at wrist. + Wynn Maudlin. Will obtain imaging due to severity of pain out of proportion to exam findings. Thumb spica splint applied. Start NSAIDs OTC. Topical Icy Hot to the area. Will refer to Hand specialist for trigger finger. .  - DG Hand Complete Left; Future - DG Wrist Complete Left; Future   Leeanne Rio, PA-C

## 2019-01-24 NOTE — Telephone Encounter (Signed)
Patient called in stating that last Thursday 10.22.20, he started noticing that his left thumb is "popping" in the joint with any type of movement and that the thumb is swollen. Denies redness, warm to touch, or any injury that he is aware of. Informed patient that I would route message to provider for recommendation if patient can be seen in office or should go to UC. Please advise

## 2019-01-24 NOTE — Telephone Encounter (Signed)
Pt would need an in-office visit to assess his thumb.  I do not have any appts today but he could see Einar Pheasant or wait for my next available

## 2019-01-24 NOTE — Telephone Encounter (Signed)
Patient is scheduled to see Cody at 1130 AM today

## 2019-02-01 ENCOUNTER — Encounter: Payer: Self-pay | Admitting: Physician Assistant

## 2019-02-03 DIAGNOSIS — M65312 Trigger thumb, left thumb: Secondary | ICD-10-CM | POA: Diagnosis not present

## 2019-04-06 ENCOUNTER — Other Ambulatory Visit: Payer: Self-pay | Admitting: Family Medicine

## 2019-04-06 NOTE — Telephone Encounter (Signed)
Please advise 

## 2019-04-11 ENCOUNTER — Ambulatory Visit (INDEPENDENT_AMBULATORY_CARE_PROVIDER_SITE_OTHER): Payer: PRIVATE HEALTH INSURANCE | Admitting: Psychiatry

## 2019-04-11 ENCOUNTER — Other Ambulatory Visit: Payer: Self-pay

## 2019-04-11 ENCOUNTER — Encounter (HOSPITAL_COMMUNITY): Payer: Self-pay | Admitting: Psychiatry

## 2019-04-11 DIAGNOSIS — F319 Bipolar disorder, unspecified: Secondary | ICD-10-CM | POA: Diagnosis not present

## 2019-04-11 DIAGNOSIS — F419 Anxiety disorder, unspecified: Secondary | ICD-10-CM | POA: Diagnosis not present

## 2019-04-11 MED ORDER — OLANZAPINE 20 MG PO TABS: 20.0000 mg | ORAL_TABLET | Freq: Every day | ORAL | 0 refills | Status: DC

## 2019-04-11 MED ORDER — LAMOTRIGINE 150 MG PO TABS: 150.0000 mg | ORAL_TABLET | Freq: Two times a day (BID) | ORAL | 0 refills | Status: DC

## 2019-04-11 MED ORDER — SERTRALINE HCL 100 MG PO TABS: 150.0000 mg | ORAL_TABLET | Freq: Every day | ORAL | 0 refills | Status: DC

## 2019-04-11 MED ORDER — HYDROXYZINE HCL 10 MG PO TABS: ORAL_TABLET | ORAL | 0 refills | Status: DC

## 2019-04-11 NOTE — Progress Notes (Signed)
Virtual Visit via Telephone Note  I connected with Timothy Burnett on 04/11/19 at  8:20 AM EST by telephone and verified that I am speaking with the correct person using two identifiers.   I discussed the limitations, risks, security and privacy concerns of performing an evaluation and management service by telephone and the availability of in person appointments. I also discussed with the patient that there may be a patient responsible charge related to this service. The patient expressed understanding and agreed to proceed.   History of Present Illness: Patient was evaluated by phone session.  He is taking his medication and reported no side effects.  He admitted sometimes he has to take frequently hydroxyzine 10 mg as he cannot sleep well.  Since started the new job at [employer] improvement he admitted there is sometimes stress but there is no major issue.  He reported his marriage life is going very well.  Denies any irritability, mood swing, anger or mania.  His energy level is good.  He is trying to lose weight and he believes he has lost few pounds since the last visit.  He has an upcoming appointment for blood work in few weeks.  He is no longer taking Ativan.  He denies drinking or using any illegal substances.  He likes to continue with his current medication.   Past Psychiatric History:Reviewed. H/O  mania and impulsive behavior. No h/o psychiatric inpatient treatment or suicidal attempt. Tried Abilify and Geodon with poor outcome.    Psychiatric Specialty Exam: Physical Exam  Review of Systems  There were no vitals taken for this visit.There is no height or weight on file to calculate BMI.  General Appearance: NA  Eye Contact:  NA  Speech:  Clear and Coherent and Normal Rate  Volume:  Normal  Mood:  Euthymic  Affect:  NA  Thought Process:  Goal Directed  Orientation:  Full (Time, Place, and Person)  Thought Content:  WDL  Suicidal Thoughts:  No  Homicidal Thoughts:  No   Memory:  Immediate;   Good Recent;   Good Remote;   Good  Judgement:  Intact  Insight:  Present  Psychomotor Activity:  NA  Concentration:  Concentration: Good and Attention Span: Good  Recall:  Good  Fund of Knowledge:  Good  Language:  Good  Akathisia:  No  Handed:  Right  AIMS (if indicated):     Assets:  Communication Skills Desire for Improvement Housing Resilience Social Support  ADL's:  Intact  Cognition:  WNL  Sleep:   ok      Assessment and Plan: Bipolar disorder type I.  Anxiety.  Patient is a stable on his current medication.  He needs a new prescription of hydroxyzine which he takes to 3 times a week to help his sleep.  I will continue Zoloft 150 mg daily, Lamictal 300 mg daily, Zyprexa 20 mg at bedtime and hydroxyzine 10 mg as needed for insomnia and anxiety.  Recommended to call us back if is any question or any concern.  He has no rash, itching tremors or shakes.  Follow-up in 3 months.  Follow Up Instructions:    I discussed the assessment and treatment plan with the patient. The patient was provided an opportunity to ask questions and all were answered. The patient agreed with the plan and demonstrated an understanding of the instructions.   The patient was advised to call back or seek an in-person evaluation if the symptoms worsen or if the condition fails  to improve as anticipated.  I provided 15 minutes of non-face-to-face time during this encounter.   Kathlee Nations, MD

## 2019-04-19 ENCOUNTER — Encounter: Payer: BC Managed Care – PPO | Admitting: Family Medicine

## 2019-04-21 ENCOUNTER — Ambulatory Visit (INDEPENDENT_AMBULATORY_CARE_PROVIDER_SITE_OTHER): Payer: BC Managed Care – PPO | Admitting: Family Medicine

## 2019-04-21 ENCOUNTER — Other Ambulatory Visit: Payer: Self-pay

## 2019-04-21 ENCOUNTER — Encounter: Payer: Self-pay | Admitting: Family Medicine

## 2019-04-21 VITALS — BP 118/64 | HR 80 | Temp 97.6°F | Resp 16 | Ht 68.0 in | Wt 192.8 lb

## 2019-04-21 DIAGNOSIS — Z125 Encounter for screening for malignant neoplasm of prostate: Secondary | ICD-10-CM

## 2019-04-21 DIAGNOSIS — E785 Hyperlipidemia, unspecified: Secondary | ICD-10-CM

## 2019-04-21 DIAGNOSIS — Z0000 Encounter for general adult medical examination without abnormal findings: Secondary | ICD-10-CM | POA: Diagnosis not present

## 2019-04-21 LAB — BASIC METABOLIC PANEL
BUN: 23 mg/dL (ref 6–23)
CO2: 28 mEq/L (ref 19–32)
Calcium: 9.8 mg/dL (ref 8.4–10.5)
Chloride: 104 mEq/L (ref 96–112)
Creatinine, Ser: 1.17 mg/dL (ref 0.40–1.50)
GFR: 64.88 mL/min (ref >60.00)
Glucose, Bld: 94 mg/dL (ref 70–99)
Potassium: 4.6 mEq/L (ref 3.5–5.1)
Sodium: 139 mEq/L (ref 135–145)

## 2019-04-21 LAB — CBC WITH DIFFERENTIAL/PLATELET
Basophils Absolute: 0 10*3/uL (ref 0.0–0.1)
Basophils Relative: 0.2 % (ref 0.0–3.0)
Eosinophils Absolute: 0.1 10*3/uL (ref 0.0–0.7)
Eosinophils Relative: 1.7 % (ref 0.0–5.0)
HCT: 41.8 % (ref 39.0–52.0)
Hemoglobin: 13.8 g/dL (ref 13.0–17.0)
Lymphocytes Relative: 29.6 % (ref 12.0–46.0)
Lymphs Abs: 2.2 10*3/uL (ref 0.7–4.0)
MCHC: 33 g/dL (ref 30.0–36.0)
MCV: 93.1 fl (ref 78.0–100.0)
Monocytes Absolute: 0.5 10*3/uL (ref 0.1–1.0)
Monocytes Relative: 6.4 % (ref 3.0–12.0)
Neutro Abs: 4.7 10*3/uL (ref 1.4–7.7)
Neutrophils Relative %: 62.1 % (ref 43.0–77.0)
Platelets: 273 10*3/uL (ref 150.0–400.0)
RBC: 4.49 Mil/uL (ref 4.22–5.81)
RDW: 14.7 % (ref 11.5–15.5)
WBC: 7.6 10*3/uL (ref 4.0–10.5)

## 2019-04-21 LAB — HEPATIC FUNCTION PANEL
ALT: 39 U/L (ref 0–53)
AST: 28 U/L (ref 0–37)
Albumin: 4.6 g/dL (ref 3.5–5.2)
Alkaline Phosphatase: 59 U/L (ref 39–117)
Bilirubin, Direct: 0.1 mg/dL (ref 0.0–0.3)
Total Bilirubin: 0.4 mg/dL (ref 0.2–1.2)
Total Protein: 7 g/dL (ref 6.0–8.3)

## 2019-04-21 LAB — LIPID PANEL
Cholesterol: 245 mg/dL — ABNORMAL HIGH (ref 0–200)
HDL: 26 mg/dL — ABNORMAL LOW (ref >39.00)
NonHDL: 219.04
Total CHOL/HDL Ratio: 9
Triglycerides: 345 mg/dL — ABNORMAL HIGH (ref 0.0–149.0)
VLDL: 69 mg/dL — ABNORMAL HIGH (ref 0.0–40.0)

## 2019-04-21 LAB — LDL CHOLESTEROL, DIRECT: Direct LDL: 160 mg/dL

## 2019-04-21 LAB — PSA: PSA: 0.65 ng/mL (ref 0.10–4.00)

## 2019-04-21 LAB — TSH: TSH: 3.21 u[IU]/mL (ref 0.35–4.50)

## 2019-04-21 NOTE — Assessment & Plan Note (Signed)
Chronic problem.  Tolerating statin w/o difficulty.  Check labs.  Adjust meds prn  

## 2019-04-21 NOTE — Patient Instructions (Addendum)
Follow up in 6 months to recheck cholesterol We'll notify you of your lab results and make any changes if needed Continue to work on healthy diet and regular exercise- you can do it! Call with any questions or concerns Stay Safe!  Stay Health!!

## 2019-04-21 NOTE — Progress Notes (Signed)
   Subjective:    Patient ID: Timothy Burnett, male    DOB: 22-Jan-1966, 54 y.o.   MRN: [id number]  HPI CPE- UTD on colonoscopy, Tdap, flu.   Review of Systems Patient reports no vision/hearing changes, anorexia, fever ,adenopathy, persistant/recurrent hoarseness, swallowing issues, chest pain, palpitations, edema, persistant/recurrent cough, hemoptysis, dyspnea (rest,exertional, paroxysmal nocturnal), gastrointestinal  bleeding (melena, rectal bleeding), abdominal pain, excessive heart burn, GU symptoms (dysuria, hematuria, voiding/incontinence issues) syncope, focal weakness, memory loss, numbness & tingling, skin/hair/nail changes, depression, anxiety, abnormal bruising/bleeding, musculoskeletal symptoms/signs.   This visit occurred during the SARS-CoV-2 public health emergency.  Safety protocols were in place, including screening questions prior to the visit, additional usage of staff PPE, and extensive cleaning of exam room while observing appropriate contact time as indicated for disinfecting solutions.       Objective:   Physical Exam BP 118/64   Pulse 80   Temp 97.6 F (36.4 C) (Tympanic)   Resp 16   Ht 5\' 8"  (1.727 m)   Wt 192 lb 12.8 oz (87.5 kg)   SpO2 98%   BMI 29.32 kg/m   General Appearance:    Alert, cooperative, no distress, appears stated age  Head:    Normocephalic, without obvious abnormality, atraumatic  Eyes:    PERRL, conjunctiva/corneas clear, EOM's intact, fundi    benign, both eyes       Ears:    Normal TM's and external ear canals, both ears  Nose:   Deferred due to COVID  Throat:   Neck:   Supple, symmetrical, trachea midline, no adenopathy;       thyroid:  No enlargement/tenderness/nodules  Back:     Symmetric, no curvature, ROM normal, no CVA tenderness  Lungs:     Clear to auscultation bilaterally, respirations unlabored  Chest wall:    No tenderness or deformity  Heart:    Regular rate and rhythm, S1 and S2 normal, no murmur, rub   or gallop  Abdomen:      Soft, non-tender, bowel sounds active all four quadrants,    no masses, no organomegaly  Genitalia:    Normal male without lesion, discharge or tenderness  Rectal:    Normal tone, normal prostate, no masses or tenderness  Extremities:   Extremities normal, atraumatic, no cyanosis or edema  Pulses:   2+ and symmetric all extremities  Skin:   Skin color, texture, turgor normal, no rashes or lesions  Lymph nodes:   Cervical, supraclavicular, and axillary nodes normal  Neurologic:   CNII-XII intact. Normal strength, sensation and reflexes      throughout          Assessment & Plan:

## 2019-04-21 NOTE — Assessment & Plan Note (Signed)
Pt's PE WNL.  UTD on colonoscopy, immunizations.  Check labs.  Anticipatory guidance provided.  

## 2019-04-22 ENCOUNTER — Encounter: Payer: Self-pay | Admitting: Family Medicine

## 2019-04-22 MED ORDER — ROSUVASTATIN CALCIUM 40 MG PO TABS: 40.0000 mg | ORAL_TABLET | Freq: Every day | ORAL | 1 refills | Status: DC

## 2019-04-28 ENCOUNTER — Other Ambulatory Visit (HOSPITAL_COMMUNITY): Payer: Self-pay | Admitting: Psychiatry

## 2019-04-28 ENCOUNTER — Telehealth (HOSPITAL_COMMUNITY): Payer: Self-pay

## 2019-04-28 DIAGNOSIS — F419 Anxiety disorder, unspecified: Secondary | ICD-10-CM

## 2019-04-28 NOTE — Telephone Encounter (Signed)
We have prescribed hydroxyzine 50 mg but it make him very groggy and sleepy and was switched to 10 mg.  Please call patient to clarify the dosage.  He should be taking only 10 mg.

## 2019-04-28 NOTE — Telephone Encounter (Signed)
Patient called regarding his Hydroxyzine Pamoate 50mg . He stated that he's out and takes it consistently. I didn't see this medication in his listed medications or in the office notes from his October or January visits. I did see Hydroxyzine 10mg  that he takes as needed. He uses [facility] on [address] in [place]. Please review and advise. Thank you.

## 2019-05-04 NOTE — Telephone Encounter (Signed)
Relayed message to patient

## 2019-06-10 ENCOUNTER — Ambulatory Visit: Payer: BC Managed Care – PPO | Attending: Internal Medicine

## 2019-06-10 DIAGNOSIS — Z23 Encounter for immunization: Secondary | ICD-10-CM

## 2019-06-10 NOTE — Progress Notes (Signed)
   Covid-19 Vaccination Clinic  Name:  Lexon Behlen    MRN: [id number] DOB: Jun 18, 1965  06/10/2019  Mr. Terry was observed post Covid-19 immunization for 15 minutes without incident. He was provided with Vaccine Information Sheet and instruction to access the V-Safe system.   Mr. Kolodziej was instructed to call 911 with any severe reactions post vaccine: Marland Kitchen Difficulty breathing  . Swelling of face and throat  . A fast heartbeat  . A bad rash all over body  . Dizziness and weakness   Immunizations Administered    Name Date Dose VIS Date Route   Pfizer COVID-19 Vaccine 06/10/2019 11:30 AM 0.3 mL 03/11/2019 Intramuscular   Manufacturer: [place]   Lot: [id number]   [place]: [id number]

## 2019-06-30 ENCOUNTER — Encounter: Payer: Self-pay | Admitting: Family Medicine

## 2019-07-04 ENCOUNTER — Other Ambulatory Visit: Payer: Self-pay | Admitting: General Practice

## 2019-07-04 ENCOUNTER — Other Ambulatory Visit (HOSPITAL_COMMUNITY): Payer: Self-pay | Admitting: Psychiatry

## 2019-07-04 DIAGNOSIS — F319 Bipolar disorder, unspecified: Secondary | ICD-10-CM

## 2019-07-04 DIAGNOSIS — F419 Anxiety disorder, unspecified: Secondary | ICD-10-CM

## 2019-07-04 MED ORDER — FENOFIBRATE 160 MG PO TABS: 160.0000 mg | ORAL_TABLET | Freq: Every day | ORAL | 1 refills | Status: DC

## 2019-07-05 ENCOUNTER — Ambulatory Visit: Payer: Self-pay | Attending: Internal Medicine

## 2019-07-05 DIAGNOSIS — Z23 Encounter for immunization: Secondary | ICD-10-CM

## 2019-07-05 NOTE — Progress Notes (Signed)
   Covid-19 Vaccination Clinic  Name:  Timothy Burnett    MRN: [id number] DOB: 24-Oct-1965  07/05/2019  Timothy Burnett was observed post Covid-19 immunization for 15 minutes without incident. He was provided with Vaccine Information Sheet and instruction to access the V-Safe system.   Timothy Burnett was instructed to call 911 with any severe reactions post vaccine: Marland Kitchen Difficulty breathing  . Swelling of face and throat  . A fast heartbeat  . A bad rash all over body  . Dizziness and weakness   Immunizations Administered    Name Date Dose VIS Date Route   Pfizer COVID-19 Vaccine 07/05/2019  9:47 AM 0.3 mL 03/11/2019 Intramuscular   Manufacturer: [place]   Lot: [id number]   [place]: [id number]

## 2019-07-11 ENCOUNTER — Encounter (HOSPITAL_COMMUNITY): Payer: Self-pay | Admitting: Psychiatry

## 2019-07-11 ENCOUNTER — Other Ambulatory Visit: Payer: Self-pay

## 2019-07-11 ENCOUNTER — Ambulatory Visit (INDEPENDENT_AMBULATORY_CARE_PROVIDER_SITE_OTHER): Payer: 59 | Admitting: Psychiatry

## 2019-07-11 DIAGNOSIS — F319 Bipolar disorder, unspecified: Secondary | ICD-10-CM | POA: Diagnosis not present

## 2019-07-11 DIAGNOSIS — F419 Anxiety disorder, unspecified: Secondary | ICD-10-CM | POA: Diagnosis not present

## 2019-07-11 MED ORDER — SERTRALINE HCL 100 MG PO TABS: 150.0000 mg | ORAL_TABLET | Freq: Every day | ORAL | 0 refills | Status: DC

## 2019-07-11 MED ORDER — HYDROXYZINE HCL 10 MG PO TABS: 10.0000 mg | ORAL_TABLET | Freq: Every day | ORAL | 0 refills | Status: DC | PRN

## 2019-07-11 MED ORDER — LAMOTRIGINE 150 MG PO TABS: 150.0000 mg | ORAL_TABLET | Freq: Two times a day (BID) | ORAL | 0 refills | Status: DC

## 2019-07-11 MED ORDER — OLANZAPINE 20 MG PO TABS: 20.0000 mg | ORAL_TABLET | Freq: Every day | ORAL | 0 refills | Status: DC

## 2019-07-11 NOTE — Progress Notes (Signed)
Virtual Visit via Telephone Note  I connected with Timothy Burnett on 07/11/19 at  8:20 AM EDT by telephone and verified that I am speaking with the correct person using two identifiers.   I discussed the limitations, risks, security and privacy concerns of performing an evaluation and management service by telephone and the availability of in person appointments. I also discussed with the patient that there may be a patient responsible charge related to this service. The patient expressed understanding and agreed to proceed.   History of Present Illness: Patient was evaluated by phone session.  He admitted some anxiety running from work.  He reported his job is very stressful and there is sometimes unrealistic expectation from the work.  He is looking for a new job.  He is working for [employer] improvement for past 1 year.  He denies any irritability, anger, mood swing but admitted sometimes anxiety and nervousness.  He takes hydroxyzine 10 mg at least 3 times a week which helps his anxiety and sleep.  He is trying to lose weight and he had lost weight and now he is 175 pounds.  He had a blood work back in January which shows high LDL and his cholesterol medicines were changed.  He is living with his wife who he been married in September.  He told that his wife is very supportive.  Patient denies any crying spells, mania, psychosis, hallucination.  He has no tremors, shakes or any EPS.  He started exercise which is helping him.  Like to keep his current medication.   Past Psychiatric History:Reviewed. H/O  mania and impulsive behavior. No h/o psychiatric inpatient treatment or suicidal attempt. Tried Abilify and Geodon with poor outcome.   Recent Results (from the past 2160 hour(s))  Lipid panel     Status: Abnormal   Collection Time: 04/21/19 10:00 AM  Result Value Ref Range   Cholesterol 245 (H) 0 - 200 mg/dL    Comment: ATP III Classification       Desirable:  < 200 mg/dL                Borderline High:  200 - 239 mg/dL          High:  > = 240 mg/dL   Triglycerides 345.0 (H) 0.0 - 149.0 mg/dL    Comment: Normal:  <150 mg/dLBorderline High:  150 - 199 mg/dL   HDL 26.00 (L) >39.00 mg/dL   VLDL 69.0 (H) 0.0 - 40.0 mg/dL   Total CHOL/HDL Ratio 9     Comment:                Men          Women1/2 Average Risk     3.4          3.3Average Risk          5.0          4.42X Average Risk          9.6          7.13X Average Risk          15.0          11.0                       NonHDL 219.04     Comment: NOTE:  Non-HDL goal should be 30 mg/dL higher than patient's LDL goal (i.e. LDL goal of < 70 mg/dL, would have non-HDL goal of <  100 mg/dL)  Basic metabolic panel     Status: None   Collection Time: 04/21/19 10:00 AM  Result Value Ref Range   Sodium 139 135 - 145 mEq/L   Potassium 4.6 3.5 - 5.1 mEq/L   Chloride 104 96 - 112 mEq/L   CO2 28 19 - 32 mEq/L   Glucose, Bld 94 70 - 99 mg/dL   BUN 23 6 - 23 mg/dL   Creatinine, Ser 1.17 0.40 - 1.50 mg/dL   GFR 64.88 >60.00 mL/min   Calcium 9.8 8.4 - 10.5 mg/dL  TSH     Status: None   Collection Time: 04/21/19 10:00 AM  Result Value Ref Range   TSH 3.21 0.35 - 4.50 uIU/mL  Hepatic function panel     Status: None   Collection Time: 04/21/19 10:00 AM  Result Value Ref Range   Total Bilirubin 0.4 0.2 - 1.2 mg/dL   Bilirubin, Direct 0.1 0.0 - 0.3 mg/dL   Alkaline Phosphatase 59 39 - 117 U/L   AST 28 0 - 37 U/L   ALT 39 0 - 53 U/L   Total Protein 7.0 6.0 - 8.3 g/dL   Albumin 4.6 3.5 - 5.2 g/dL  CBC with Differential/Platelet     Status: None   Collection Time: 04/21/19 10:00 AM  Result Value Ref Range   WBC 7.6 4.0 - 10.5 K/uL   RBC 4.49 4.22 - 5.81 Mil/uL   Hemoglobin 13.8 13.0 - 17.0 g/dL   HCT 41.8 39.0 - 52.0 %   MCV 93.1 78.0 - 100.0 fl   MCHC 33.0 30.0 - 36.0 g/dL   RDW 14.7 11.5 - 15.5 %   Platelets 273.0 150.0 - 400.0 K/uL   Neutrophils Relative % 62.1 43.0 - 77.0 %   Lymphocytes Relative 29.6 12.0 - 46.0 %   Monocytes  Relative 6.4 3.0 - 12.0 %   Eosinophils Relative 1.7 0.0 - 5.0 %   Basophils Relative 0.2 0.0 - 3.0 %   Neutro Abs 4.7 1.4 - 7.7 K/uL   Lymphs Abs 2.2 0.7 - 4.0 K/uL   Monocytes Absolute 0.5 0.1 - 1.0 K/uL   Eosinophils Absolute 0.1 0.0 - 0.7 K/uL   Basophils Absolute 0.0 0.0 - 0.1 K/uL  PSA     Status: None   Collection Time: 04/21/19 10:00 AM  Result Value Ref Range   PSA 0.65 0.10 - 4.00 ng/mL    Comment: Test performed using Access [employer] PSA Assay, a parmagnetic partical, chemiluminecent immunoassay.  LDL cholesterol, direct     Status: None   Collection Time: 04/21/19 10:00 AM  Result Value Ref Range   Direct LDL 160.0 mg/dL    Comment: Optimal:  <100 mg/dLNear or Above Optimal:  100-129 mg/dLBorderline High:  130-159 mg/dLHigh:  160-189 mg/dLVery High:  >190 mg/dL     Psychiatric Specialty Exam: Physical Exam  Review of Systems  There were no vitals taken for this visit.There is no height or weight on file to calculate BMI.  General Appearance: NA  Eye Contact:  NA  Speech:  Normal Rate  Volume:  Normal  Mood:  Anxious  Affect:  NA  Thought Process:  Goal Directed  Orientation:  Full (Time, Place, and Person)  Thought Content:  Rumination  Suicidal Thoughts:  No  Homicidal Thoughts:  No  Memory:  Immediate;   Good Recent;   Good Remote;   Good  Judgement:  Intact  Insight:  Present  Psychomotor Activity:  NA  Concentration:  Concentration:  Good and Attention Span: Fair  Recall:  Good  Fund of Knowledge:  Good  Language:  Good  Akathisia:  No  Handed:  Right  AIMS (if indicated):     Assets:  Communication Skills Desire for Improvement Housing Resilience Social Support  ADL's:  Intact  Cognition:  WNL  Sleep:         Assessment and Plan: Bipolar disorder type I.  Anxiety.  Patient endorsed some anxiety coming from work but otherwise his symptoms are stable.  He does not want to change medication.  I reviewed blood work results.  He had lost  weight since started exercise.  Discussed medication side effects and benefits.  Continue Zoloft 150 mg daily, Lamictal 300 mg daily, Zyprexa 20 mg at bedtime and hydroxyzine 10 mg as needed for insomnia and anxiety.  I offered therapy but at this time he does not feel he needed.  I recommend to call us back if is any question or any concern.  Follow-up in 3 months.  Follow Up Instructions:    I discussed the assessment and treatment plan with the patient. The patient was provided an opportunity to ask questions and all were answered. The patient agreed with the plan and demonstrated an understanding of the instructions.   The patient was advised to call back or seek an in-person evaluation if the symptoms worsen or if the condition fails to improve as anticipated.  I provided 20 minutes of non-face-to-face time during this encounter.   Kathlee Nations, MD

## 2019-07-16 ENCOUNTER — Encounter: Payer: Self-pay | Admitting: Family Medicine

## 2019-07-18 MED ORDER — ROSUVASTATIN CALCIUM 40 MG PO TABS: 40.0000 mg | ORAL_TABLET | Freq: Every day | ORAL | 1 refills | Status: DC

## 2019-10-06 ENCOUNTER — Telehealth (HOSPITAL_COMMUNITY): Payer: Self-pay | Admitting: *Deleted

## 2019-10-06 NOTE — Telephone Encounter (Signed)
He can try melatonin up to 6 mg at night.  If he still have insomnia and then he can take hydroxyzine up to 20 mg.  He is already taking 10 mg.

## 2019-10-06 NOTE — Telephone Encounter (Signed)
Pt called with c/o decreased and very restless sleep. Possibly wanting something to help. Pt is already taking Zyprexa 20mg  HS.pt has an appointment on 10/19/19. Please review and advise.

## 2019-10-13 ENCOUNTER — Encounter: Payer: Self-pay | Admitting: Family Medicine

## 2019-10-18 ENCOUNTER — Ambulatory Visit (INDEPENDENT_AMBULATORY_CARE_PROVIDER_SITE_OTHER): Payer: 59 | Admitting: Family Medicine

## 2019-10-18 ENCOUNTER — Encounter: Payer: Self-pay | Admitting: Family Medicine

## 2019-10-18 ENCOUNTER — Other Ambulatory Visit: Payer: Self-pay

## 2019-10-18 VITALS — BP 112/78 | HR 80 | Temp 97.9°F | Resp 16 | Ht 68.0 in | Wt 197.2 lb

## 2019-10-18 DIAGNOSIS — E785 Hyperlipidemia, unspecified: Secondary | ICD-10-CM

## 2019-10-18 DIAGNOSIS — R635 Abnormal weight gain: Secondary | ICD-10-CM | POA: Insufficient documentation

## 2019-10-18 LAB — LIPID PANEL
Cholesterol: 194 mg/dL (ref 0–200)
HDL: 18.9 mg/dL — ABNORMAL LOW (ref >39.00)
NonHDL: 175.35
Total CHOL/HDL Ratio: 10
Triglycerides: 326 mg/dL — ABNORMAL HIGH (ref 0.0–149.0)
VLDL: 65.2 mg/dL — ABNORMAL HIGH (ref 0.0–40.0)

## 2019-10-18 LAB — HEPATIC FUNCTION PANEL
ALT: 31 U/L (ref 0–53)
AST: 21 U/L (ref 0–37)
Albumin: 4.3 g/dL (ref 3.5–5.2)
Alkaline Phosphatase: 61 U/L (ref 39–117)
Bilirubin, Direct: 0.1 mg/dL (ref 0.0–0.3)
Total Bilirubin: 0.4 mg/dL (ref 0.2–1.2)
Total Protein: 6.6 g/dL (ref 6.0–8.3)

## 2019-10-18 LAB — CBC WITH DIFFERENTIAL/PLATELET
Basophils Absolute: 0 10*3/uL (ref 0.0–0.1)
Basophils Relative: 0.3 % (ref 0.0–3.0)
Eosinophils Absolute: 0.2 10*3/uL (ref 0.0–0.7)
Eosinophils Relative: 2.2 % (ref 0.0–5.0)
HCT: 39.1 % (ref 39.0–52.0)
Hemoglobin: 13.2 g/dL (ref 13.0–17.0)
Lymphocytes Relative: 25.8 % (ref 12.0–46.0)
Lymphs Abs: 1.8 10*3/uL (ref 0.7–4.0)
MCHC: 33.7 g/dL (ref 30.0–36.0)
MCV: 92.2 fl (ref 78.0–100.0)
Monocytes Absolute: 0.5 10*3/uL (ref 0.1–1.0)
Monocytes Relative: 7.7 % (ref 3.0–12.0)
Neutro Abs: 4.4 10*3/uL (ref 1.4–7.7)
Neutrophils Relative %: 64 % (ref 43.0–77.0)
Platelets: 238 10*3/uL (ref 150.0–400.0)
RBC: 4.24 Mil/uL (ref 4.22–5.81)
RDW: 14.6 % (ref 11.5–15.5)
WBC: 6.8 10*3/uL (ref 4.0–10.5)

## 2019-10-18 LAB — TSH: TSH: 2.67 u[IU]/mL (ref 0.35–4.50)

## 2019-10-18 LAB — BASIC METABOLIC PANEL
BUN: 19 mg/dL (ref 6–23)
CO2: 27 mEq/L (ref 19–32)
Calcium: 9.6 mg/dL (ref 8.4–10.5)
Chloride: 104 mEq/L (ref 96–112)
Creatinine, Ser: 1.14 mg/dL (ref 0.40–1.50)
GFR: 66.74 mL/min (ref >60.00)
Glucose, Bld: 90 mg/dL (ref 70–99)
Potassium: 4.3 mEq/L (ref 3.5–5.1)
Sodium: 139 mEq/L (ref 135–145)

## 2019-10-18 LAB — LDL CHOLESTEROL, DIRECT: Direct LDL: 133 mg/dL

## 2019-10-18 NOTE — Assessment & Plan Note (Signed)
New.  Pt has gained 22 lbs due to stress eating and lack of exercise.  Encouraged healthy diet and regular activity.  Will follow.

## 2019-10-18 NOTE — Progress Notes (Signed)
° °  Subjective:    Patient ID: Timothy Burnett, male    DOB: 1965-12-25, 54 y.o.   MRN: [id number]  HPI Hyperlipidemia- chronic problem, on Crestor 40mg  daily and Fenofibrate 160mg  daily.  Pt has gained 22 lbs since last visit.  No CP, SOB, HAs, abd pain, N/V.  Weight gain- pt has gained 22 lbs since April.  No regular exercise.  Pt admits to emotional/stress eating.  Currently job hunting which is stressful.   Review of Systems For ROS see HPI   This visit occurred during the SARS-CoV-2 public health emergency.  Safety protocols were in place, including screening questions prior to the visit, additional usage of staff PPE, and extensive cleaning of exam room while observing appropriate contact time as indicated for disinfecting solutions.       Objective:   Physical Exam Vitals reviewed.  Constitutional:      General: He is not in acute distress.    Appearance: Normal appearance. He is well-developed.  HENT:     Head: Normocephalic and atraumatic.  Eyes:     Conjunctiva/sclera: Conjunctivae normal.     Pupils: Pupils are equal, round, and reactive to light.  Neck:     Thyroid: No thyromegaly.  Cardiovascular:     Rate and Rhythm: Normal rate and regular rhythm.     Heart sounds: Normal heart sounds. No murmur heard.   Pulmonary:     Effort: Pulmonary effort is normal. No respiratory distress.     Breath sounds: Normal breath sounds.  Abdominal:     General: Bowel sounds are normal. There is no distension.     Palpations: Abdomen is soft.  Musculoskeletal:     Cervical back: Normal range of motion and neck supple.  Lymphadenopathy:     Cervical: No cervical adenopathy.  Skin:    General: Skin is warm and dry.  Neurological:     Mental Status: He is alert and oriented to person, place, and time.     Cranial Nerves: No cranial nerve deficit.  Psychiatric:        Behavior: Behavior normal.           Assessment & Plan:

## 2019-10-18 NOTE — Assessment & Plan Note (Signed)
Chronic problem.  Tolerating statin w/o difficulty.  Encouraged healthy diet and regular exercise as pt has recently gained 22 lbs.  Check labs.  Adjust meds prn

## 2019-10-18 NOTE — Patient Instructions (Signed)
Schedule your complete physical in 6 months We'll notify you of your lab results and make any changes if needed Continue to work on healthy diet and regular exercise- you can do it! Call with any questions or concerns Hang in there! I am so sorry for your loss!

## 2019-10-19 ENCOUNTER — Telehealth (INDEPENDENT_AMBULATORY_CARE_PROVIDER_SITE_OTHER): Payer: 59 | Admitting: Psychiatry

## 2019-10-19 ENCOUNTER — Encounter (HOSPITAL_COMMUNITY): Payer: Self-pay | Admitting: Psychiatry

## 2019-10-19 ENCOUNTER — Encounter: Payer: Self-pay | Admitting: General Practice

## 2019-10-19 DIAGNOSIS — F319 Bipolar disorder, unspecified: Secondary | ICD-10-CM

## 2019-10-19 DIAGNOSIS — F419 Anxiety disorder, unspecified: Secondary | ICD-10-CM

## 2019-10-19 MED ORDER — SERTRALINE HCL 100 MG PO TABS: 150.0000 mg | ORAL_TABLET | Freq: Every day | ORAL | 0 refills | Status: DC

## 2019-10-19 MED ORDER — LAMOTRIGINE 150 MG PO TABS: 150.0000 mg | ORAL_TABLET | Freq: Two times a day (BID) | ORAL | 0 refills | Status: DC

## 2019-10-19 MED ORDER — HYDROXYZINE HCL 10 MG PO TABS: 10.0000 mg | ORAL_TABLET | Freq: Every day | ORAL | 0 refills | Status: DC | PRN

## 2019-10-19 MED ORDER — OLANZAPINE 15 MG PO TABS: 30.0000 mg | ORAL_TABLET | Freq: Every day | ORAL | 1 refills | Status: DC

## 2019-10-19 NOTE — Progress Notes (Signed)
Virtual Visit via Telephone Note  I connected with Timothy Burnett on 10/19/19 at  8:20 AM EDT by telephone and verified that I am speaking with the correct person using two identifiers.  Location: Patient: work Provider: Home office   I discussed the limitations, risks, security and privacy concerns of performing an evaluation and management service by telephone and the availability of in person appointments. I also discussed with the patient that there may be a patient responsible charge related to this service. The patient expressed understanding and agreed to proceed.   History of Present Illness: Patient is evaluated by phone session.  He is under a lot of stress because he had lost his job after he did mistake at work.  He was hoping that after appeal he may able to get his job but in March they let him go.  Currently he is working temporary with the company but actively looking for a new job.  He admitted despite taking the medication he feels very stressed, irritable and restless at night.  His wife started sleeping in a different room because he is very restless at night.  Denies any hallucination, paranoia or any suicidal thoughts.  He admitted to stress causing increased appetite and he had gained weight from the past.  Yesterday he had a blood work.  His LDL and triglycerides better but his triglycerides still high.  He has no rash, itching, tremors or shakes.  He admitted not exercising because he is stressed about his job.  His basic chemistry and CBC is normal.  He lives with his wife who is supportive.  He denies any paranoia or any aggressive thoughts other than his wife noticed that he is restless at night.  He tried melatonin up to 10 mg but it does not help his sleep and he feels very tired in the morning.  Denies drinking or using any illegal substances.   Past Psychiatric History:Reviewed. H/Omania and impulsive behavior. No h/opsychiatric inpatient treatment orsuicidal  attempt. Tried Abilify and Geodon with poor outcome.  Recent Results (from the past 2160 hour(s))  Lipid panel     Status: Abnormal   Collection Time: 10/18/19  9:16 AM  Result Value Ref Range   Cholesterol 194 0 - 200 mg/dL    Comment: ATP III Classification       Desirable:  < 200 mg/dL               Borderline High:  200 - 239 mg/dL          High:  > = 240 mg/dL   Triglycerides 326.0 (H) 0 - 149 mg/dL    Comment: Normal:  <150 mg/dLBorderline High:  150 - 199 mg/dL   HDL 18.90 (L) >39.00 mg/dL   VLDL 65.2 (H) 0.0 - 40.0 mg/dL   Total CHOL/HDL Ratio 10     Comment:                Men          Women1/2 Average Risk     3.4          3.3Average Risk          5.0          4.42X Average Risk          9.6          7.13X Average Risk          15.0          11.0  NonHDL 175.35     Comment: NOTE:  Non-HDL goal should be 30 mg/dL higher than patient's LDL goal (i.e. LDL goal of < 70 mg/dL, would have non-HDL goal of < 100 mg/dL)  Basic metabolic panel     Status: None   Collection Time: 10/18/19  9:16 AM  Result Value Ref Range   Sodium 139 135 - 145 mEq/L   Potassium 4.3 3.5 - 5.1 mEq/L   Chloride 104 96 - 112 mEq/L   CO2 27 19 - 32 mEq/L   Glucose, Bld 90 70 - 99 mg/dL   BUN 19 6 - 23 mg/dL   Creatinine, Ser 1.14 0.40 - 1.50 mg/dL   GFR 66.74 >60.00 mL/min   Calcium 9.6 8.4 - 10.5 mg/dL  Hepatic function panel     Status: None   Collection Time: 10/18/19  9:16 AM  Result Value Ref Range   Total Bilirubin 0.4 0.2 - 1.2 mg/dL   Bilirubin, Direct 0.1 0.0 - 0.3 mg/dL   Alkaline Phosphatase 61 39 - 117 U/L   AST 21 0 - 37 U/L   ALT 31 0 - 53 U/L   Total Protein 6.6 6.0 - 8.3 g/dL   Albumin 4.3 3.5 - 5.2 g/dL  TSH     Status: None   Collection Time: 10/18/19  9:16 AM  Result Value Ref Range   TSH 2.67 0.35 - 4.50 uIU/mL  CBC with Differential/Platelet     Status: None   Collection Time: 10/18/19  9:16 AM  Result Value Ref Range   WBC 6.8 4.0 - 10.5 K/uL   RBC  4.24 4.22 - 5.81 Mil/uL   Hemoglobin 13.2 13.0 - 17.0 g/dL   HCT 39.1 39 - 52 %   MCV 92.2 78.0 - 100.0 fl   MCHC 33.7 30.0 - 36.0 g/dL   RDW 14.6 11.5 - 15.5 %   Platelets 238.0 150 - 400 K/uL   Neutrophils Relative % 64.0 43 - 77 %   Lymphocytes Relative 25.8 12 - 46 %   Monocytes Relative 7.7 3 - 12 %   Eosinophils Relative 2.2 0 - 5 %   Basophils Relative 0.3 0 - 3 %   Neutro Abs 4.4 1.4 - 7.7 K/uL   Lymphs Abs 1.8 0.7 - 4.0 K/uL   Monocytes Absolute 0.5 0 - 1 K/uL   Eosinophils Absolute 0.2 0 - 0 K/uL   Basophils Absolute 0.0 0 - 0 K/uL  LDL cholesterol, direct     Status: None   Collection Time: 10/18/19  9:16 AM  Result Value Ref Range   Direct LDL 133.0 mg/dL    Comment: Optimal:  <100 mg/dLNear or Above Optimal:  100-129 mg/dLBorderline High:  130-159 mg/dLHigh:  160-189 mg/dLVery High:  >190 mg/dL      Psychiatric Specialty Exam: Physical Exam  Review of Systems  Weight 205 lb (93 kg).There is no height or weight on file to calculate BMI.  General Appearance: NA  Eye Contact:  NA  Speech:  Clear and Coherent  Volume:  Normal  Mood:  Anxious and Irritable  Affect:  NA  Thought Process:  Goal Directed  Orientation:  Full (Time, Place, and Person)  Thought Content:  Rumination  Suicidal Thoughts:  No  Homicidal Thoughts:  No  Memory:  Immediate;   Good Recent;   Good Remote;   Good  Judgement:  Intact  Insight:  Present  Psychomotor Activity:  NA  Concentration:  Concentration: Fair and Attention Span: Fair  Recall:  Good  Fund of Knowledge:  Good  Language:  Good  Akathisia:  No  Handed:  Right  AIMS (if indicated):     Assets:  Communication Skills Desire for Improvement Housing Social Support Transportation  ADL's:  Intact  Cognition:  WNL  Sleep:   restless      Assessment and Plan: Bipolar disorder type I.  Anxiety.  I reviewed blood work results and current medication.  Patient is experiencing anxiety, restlessness and irritability.  He  is actively looking for a job.  After leaving the blood work I recommend that he should try going up on Zyprexa 30 mg and he agreed with it.  He has no rash so we will continue Lamictal 300 mg daily, Zoloft and 50 mg daily.  He will continue hydroxyzine 10 mg as needed.  He tried higher dose of hydroxyzine but it makes him tired.  Increasing Zyprexa will help his mood, irritability and sleep.  However if medicine did not help we will consider switching to a different medication.  In the past he had tried Abilify and Geodon that did not help him.  Also talked about therapy but at this time patient like to find a job first and if symptoms do not improve then he will consider therapy.  Discuss safety concern that anytime having active suicidal thoughts or homicidal thought that he need to call 911 or go to local emergency room.  Follow-up in 6 weeks.  Follow Up Instructions:    I discussed the assessment and treatment plan with the patient. The patient was provided an opportunity to ask questions and all were answered. The patient agreed with the plan and demonstrated an understanding of the instructions.   The patient was advised to call back or seek an in-person evaluation if the symptoms worsen or if the condition fails to improve as anticipated.  I provided 20 minutes of non-face-to-face time during this encounter.   Kathlee Nations, MD

## 2019-10-21 ENCOUNTER — Other Ambulatory Visit (HOSPITAL_COMMUNITY): Payer: Self-pay | Admitting: *Deleted

## 2019-10-21 DIAGNOSIS — F419 Anxiety disorder, unspecified: Secondary | ICD-10-CM

## 2019-10-21 DIAGNOSIS — F319 Bipolar disorder, unspecified: Secondary | ICD-10-CM

## 2019-10-21 MED ORDER — OLANZAPINE 15 MG PO TABS: 30.0000 mg | ORAL_TABLET | Freq: Every day | ORAL | 1 refills | Status: DC

## 2019-10-21 MED ORDER — SERTRALINE HCL 100 MG PO TABS: 150.0000 mg | ORAL_TABLET | Freq: Every day | ORAL | 0 refills | Status: DC

## 2019-10-21 MED ORDER — LAMOTRIGINE 150 MG PO TABS: 150.0000 mg | ORAL_TABLET | Freq: Two times a day (BID) | ORAL | 0 refills | Status: DC

## 2019-10-21 MED ORDER — HYDROXYZINE HCL 10 MG PO TABS: 10.0000 mg | ORAL_TABLET | Freq: Every day | ORAL | 0 refills | Status: DC | PRN

## 2019-12-08 ENCOUNTER — Telehealth (INDEPENDENT_AMBULATORY_CARE_PROVIDER_SITE_OTHER): Payer: 59 | Admitting: Psychiatry

## 2019-12-08 ENCOUNTER — Other Ambulatory Visit: Payer: Self-pay

## 2019-12-08 ENCOUNTER — Encounter (HOSPITAL_COMMUNITY): Payer: Self-pay | Admitting: Psychiatry

## 2019-12-08 DIAGNOSIS — F419 Anxiety disorder, unspecified: Secondary | ICD-10-CM

## 2019-12-08 DIAGNOSIS — F319 Bipolar disorder, unspecified: Secondary | ICD-10-CM | POA: Diagnosis not present

## 2019-12-08 MED ORDER — SERTRALINE HCL 100 MG PO TABS: 150.0000 mg | ORAL_TABLET | Freq: Every day | ORAL | 0 refills | Status: DC

## 2019-12-08 MED ORDER — OLANZAPINE 15 MG PO TABS: 30.0000 mg | ORAL_TABLET | Freq: Every day | ORAL | 1 refills | Status: DC

## 2019-12-08 MED ORDER — LAMOTRIGINE 150 MG PO TABS: 150.0000 mg | ORAL_TABLET | Freq: Two times a day (BID) | ORAL | 0 refills | Status: DC

## 2019-12-08 MED ORDER — HYDROXYZINE HCL 10 MG PO TABS: 10.0000 mg | ORAL_TABLET | Freq: Every day | ORAL | 0 refills | Status: DC | PRN

## 2019-12-08 NOTE — Progress Notes (Signed)
Virtual Visit via Telephone Note  I connected with Timothy Burnett on 12/08/19 at  8:20 AM EDT by telephone and verified that I am speaking with the correct person using two identifiers.  Location: Patient: home Provider: home office   I discussed the limitations, risks, security and privacy concerns of performing an evaluation and management service by telephone and the availability of in person appointments. I also discussed with the patient that there may be a patient responsible charge related to this service. The patient expressed understanding and agreed to proceed.   History of Present Illness: Patient is evaluated phone session.  He is a still looking for a job and so far he had 2 job interviews but he is negotiating with that.  He is sleeping good.  Denies any mania, irritability, anger, suicidal thoughts.  Though he is a still stressed about his current situation but he is very hopeful that he is able to find a job.  He lives with his wife who is very supportive.  On the last visit we increase olanzapine to 30 mg and he admitted it is helping his mood, sleep and irritability.  He is also trying to lose weight and he lost few pounds since the last visit.  He is compliant with Lamictal and Zoloft.  He has no tremors, shakes or any EPS.  He feels much better with the dose adjustment of the olanzapine and he is hoping he will get a job very soon.    Past Psychiatric History: H/Omania and impulsive behavior. No h/opsychiatric inpatient treatment orsuicidal attempt. Tried Abilify and Geodon with poor outcome.   Psychiatric Specialty Exam: Physical Exam  Review of Systems  Weight 195 lb (88.5 kg).There is no height or weight on file to calculate BMI.  General Appearance: NA  Eye Contact:  NA  Speech:  Normal Rate  Volume:  Normal  Mood:  Euthymic  Affect:  NA  Thought Process:  Goal Directed  Orientation:  Full (Time, Place, and Person)  Thought Content:  WDL  Suicidal Thoughts:   No  Homicidal Thoughts:  No  Memory:  Immediate;   Good Recent;   Good Remote;   Good  Judgement:  Intact  Insight:  Present  Psychomotor Activity:  NA  Concentration:  Concentration: Fair and Attention Span: Fair  Recall:  Good  Fund of Knowledge:  Good  Language:  Good  Akathisia:  No  Handed:  Right  AIMS (if indicated):     Assets:  Communication Skills Desire for Improvement Housing Physical Health Resilience Social Support  ADL's:  Intact  Cognition:  WNL  Sleep:   improved      Assessment and Plan: Bipolar disorder type.  Anxiety.  Patient doing better since we increased the olanzapine 30 mg.  So far he is tolerating and reported no side effects.  He is taking hydroxyzine at least 2-3 times a week to help his anxiety.  He like to keep his current medication.  He is not interested in switching to a different medication since it is working.  Continue Zoloft 150 mg daily, Zyprexa 30 mg at bedtime, Lamictal 300 mg daily, hydroxyzine 10 mg to take as needed 2-3 times a week.  Recommended to call us back if he has any question, concern if he feels worsening of the symptom.  Follow-up in 3 months.  Follow Up Instructions:    I discussed the assessment and treatment plan with the patient. The patient was provided an opportunity to ask  questions and all were answered. The patient agreed with the plan and demonstrated an understanding of the instructions.   The patient was advised to call back or seek an in-person evaluation if the symptoms worsen or if the condition fails to improve as anticipated.  I provided 18 minutes of non-face-to-face time during this encounter.   Kathlee Nations, MD

## 2020-01-14 ENCOUNTER — Other Ambulatory Visit: Payer: Self-pay | Admitting: Family Medicine

## 2020-01-25 ENCOUNTER — Other Ambulatory Visit: Payer: Self-pay | Admitting: Family Medicine

## 2020-01-27 ENCOUNTER — Other Ambulatory Visit: Payer: Self-pay | Admitting: General Practice

## 2020-01-27 ENCOUNTER — Encounter: Payer: Self-pay | Admitting: Family Medicine

## 2020-01-27 MED ORDER — ROSUVASTATIN CALCIUM 40 MG PO TABS
40.0000 mg | ORAL_TABLET | Freq: Every day | ORAL | 1 refills | Status: DC
Start: 2020-01-27 — End: 2020-06-20

## 2020-02-24 ENCOUNTER — Other Ambulatory Visit (HOSPITAL_COMMUNITY): Payer: Self-pay | Admitting: Psychiatry

## 2020-02-24 DIAGNOSIS — F319 Bipolar disorder, unspecified: Secondary | ICD-10-CM

## 2020-02-27 ENCOUNTER — Telehealth (HOSPITAL_COMMUNITY): Payer: Self-pay | Admitting: *Deleted

## 2020-02-27 DIAGNOSIS — F319 Bipolar disorder, unspecified: Secondary | ICD-10-CM

## 2020-02-27 MED ORDER — OLANZAPINE 15 MG PO TABS: 30.0000 mg | ORAL_TABLET | Freq: Every day | ORAL | 0 refills | Status: DC

## 2020-02-27 NOTE — Telephone Encounter (Signed)
Patient called and requested refill of Zyprexa.  He has been out since last Wednesday.  His next appt is Dec 9.  Please review.

## 2020-02-27 NOTE — Telephone Encounter (Signed)
Done

## 2020-03-08 ENCOUNTER — Other Ambulatory Visit: Payer: Self-pay

## 2020-03-08 ENCOUNTER — Telehealth (INDEPENDENT_AMBULATORY_CARE_PROVIDER_SITE_OTHER): Payer: PRIVATE HEALTH INSURANCE | Admitting: Psychiatry

## 2020-03-08 ENCOUNTER — Encounter (HOSPITAL_COMMUNITY): Payer: Self-pay | Admitting: Psychiatry

## 2020-03-08 DIAGNOSIS — F319 Bipolar disorder, unspecified: Secondary | ICD-10-CM

## 2020-03-08 DIAGNOSIS — F419 Anxiety disorder, unspecified: Secondary | ICD-10-CM

## 2020-03-08 MED ORDER — SERTRALINE HCL 100 MG PO TABS: 150.0000 mg | ORAL_TABLET | Freq: Every day | ORAL | 0 refills | Status: DC

## 2020-03-08 MED ORDER — HYDROXYZINE HCL 10 MG PO TABS: 10.0000 mg | ORAL_TABLET | Freq: Every day | ORAL | 0 refills | Status: DC | PRN

## 2020-03-08 MED ORDER — LAMOTRIGINE 150 MG PO TABS: 150.0000 mg | ORAL_TABLET | Freq: Two times a day (BID) | ORAL | 0 refills | Status: DC

## 2020-03-08 MED ORDER — OLANZAPINE 20 MG PO TABS: 20.0000 mg | ORAL_TABLET | Freq: Every day | ORAL | 1 refills | Status: DC

## 2020-03-08 NOTE — Progress Notes (Signed)
Virtual Visit via Telephone Note  I connected with Timothy Burnett on 03/08/20 at 11:20 AM EST by telephone and verified that I am speaking with the correct person using two identifiers.  Location: Patient: work Provider: home office   I discussed the limitations, risks, security and privacy concerns of performing an evaluation and management service by telephone and the availability of in person appointments. I also discussed with the patient that there may be a patient responsible charge related to this service. The patient expressed understanding and agreed to proceed.   History of Present Illness: Patient is evaluated by phone session.  He is on the phone by himself.  He is able to find a job in [place].  He is happy about it.  He is now working for [employer] with normal hours and he is very happy because now we can send holidays.  His mood is stable.  He denies any anger, irritability, mania or any agitation.  He is sleeping okay but his wife told him that in the night he gets sometimes restless.  He is not sure why and how long it is been happening.  However we have increased olanzapine 30 mg 3 months ago and he believes that may trigger his restlessness at night.  He takes hydroxyzine 10 mg at bedtime.  He denies any panic attack or nervousness.  His energy level is okay.  His appetite is okay and his weight is stable.  He has no tremors, shakes or any EPS.  He is scheduled to see his PCP in January for blood work.  He is taking cholesterol-lowering medication.   Past Psychiatric History: H/Omania and impulsive behavior. No h/opsychiatric inpatient treatment orsuicidal attempt. Tried Abilify and Geodon with poor outcome.   Psychiatric Specialty Exam: Physical Exam  Review of Systems  Weight 195 lb (88.5 kg).There is no height or weight on file to calculate BMI.  General Appearance: NA  Eye Contact:  NA  Speech:  Clear and Coherent  Volume:  Normal  Mood:   Euthymic  Affect:  NA  Thought Process:  Goal Directed  Orientation:  Full (Time, Place, and Person)  Thought Content:  Logical  Suicidal Thoughts:  No  Homicidal Thoughts:  No  Memory:  Immediate;   Good Recent;   Good Remote;   Good  Judgement:  Good  Insight:  Present  Psychomotor Activity:  NA  Concentration:  Concentration: Good and Attention Span: Good  Recall:  Good  Fund of Knowledge:  Good  Language:  Good  Akathisia:  No  Handed:  Right  AIMS (if indicated):     Assets:  Communication Skills Desire for Improvement Housing Resilience Social Support Talents/Skills Transportation  ADL's:  Intact  Cognition:  WNL  Sleep:         Assessment and Plan: Bipolar disorder type I.  Anxiety.  Patient is feeling better since he got the job in [employer] with normal hours.  We talked about reducing the olanzapine back to 20 mg which he used to take with good response.  I explained that may help his restlessness at night.  However I recommend if he feels his symptoms and mood lability coming back then he should call us immediately.  I also recommend he can try taking hydroxyzine 10 mg-20 mg at bedtime to help with restlessness.  He agreed with the plan.  Continue Lamictal 300 mg daily, Zoloft 150 mg daily and he will try olanzapine 20 mg at bedtime.  Recommended to call us back if is any question or any concern.  Follow-up in 2 months.  Follow Up Instructions:    I discussed the assessment and treatment plan with the patient. The patient was provided an opportunity to ask questions and all were answered. The patient agreed with the plan and demonstrated an understanding of the instructions.   The patient was advised to call back or seek an in-person evaluation if the symptoms worsen or if the condition fails to improve as anticipated.  I provided 14 minutes of non-face-to-face time during this encounter.   Kathlee Nations, MD

## 2020-04-09 ENCOUNTER — Ambulatory Visit: Payer: 59 | Admitting: Family Medicine

## 2020-04-23 ENCOUNTER — Encounter: Payer: 59 | Admitting: Family Medicine

## 2020-04-26 ENCOUNTER — Encounter: Payer: Self-pay | Admitting: Family Medicine

## 2020-04-26 ENCOUNTER — Other Ambulatory Visit: Payer: Self-pay

## 2020-04-26 DIAGNOSIS — E785 Hyperlipidemia, unspecified: Secondary | ICD-10-CM

## 2020-04-26 MED ORDER — FENOFIBRATE 160 MG PO TABS: 160.0000 mg | ORAL_TABLET | Freq: Every day | ORAL | 0 refills | Status: DC

## 2020-05-04 ENCOUNTER — Telehealth (INDEPENDENT_AMBULATORY_CARE_PROVIDER_SITE_OTHER): Payer: BC Managed Care – PPO | Admitting: Psychiatry

## 2020-05-04 ENCOUNTER — Encounter (HOSPITAL_COMMUNITY): Payer: Self-pay | Admitting: Psychiatry

## 2020-05-04 ENCOUNTER — Other Ambulatory Visit: Payer: Self-pay

## 2020-05-04 DIAGNOSIS — F419 Anxiety disorder, unspecified: Secondary | ICD-10-CM | POA: Diagnosis not present

## 2020-05-04 DIAGNOSIS — F319 Bipolar disorder, unspecified: Secondary | ICD-10-CM

## 2020-05-04 MED ORDER — HYDROXYZINE HCL 25 MG PO TABS: 25.0000 mg | ORAL_TABLET | Freq: Every day | ORAL | 0 refills | Status: DC

## 2020-05-04 MED ORDER — OLANZAPINE 20 MG PO TABS: 20.0000 mg | ORAL_TABLET | Freq: Every day | ORAL | 0 refills | Status: DC

## 2020-05-04 MED ORDER — LAMOTRIGINE 150 MG PO TABS: 150.0000 mg | ORAL_TABLET | Freq: Two times a day (BID) | ORAL | 0 refills | Status: DC

## 2020-05-04 MED ORDER — SERTRALINE HCL 100 MG PO TABS: 150.0000 mg | ORAL_TABLET | Freq: Every day | ORAL | 0 refills | Status: DC

## 2020-05-04 NOTE — Progress Notes (Addendum)
Virtual Visit via Telephone Note  I connected with Timothy Burnett on 05/04/20 at  8:20 AM EST by telephone and verified that I am speaking with the correct person using two identifiers.  Location: Patient: Home Provider: Home Office   I discussed the limitations, risks, security and privacy concerns of performing an evaluation and management service by telephone and the availability of in person appointments. I also discussed with the patient that there may be a patient responsible charge related to this service. The patient expressed understanding and agreed to proceed.   History of Present Illness: Patient is a evaluated by phone session.  He is on the phone by himself.  On the last visit we cut down his olanzapine and he is taking 20 mg.  He is doing well and he denies any mania, psychosis, anger and he feels it is working well.  He had a good Christmas and holidays.  His job is going well.  He like his new job which has normal hours.  He is working in a [employer].  He reported his family life is also good.  However he is concerned about restlessness at night.  During the day he feels good but at nighttime he feels sometimes his sleep is very restless and he claimed that he is violent in the sleep.  However he do not recall any nightmares, flashback.  Patient told it started 3 weeks ago but do not recall anything trigger.  Denies any suicidal thoughts or homicidal thoughts.  He lives with his wife who is very supportive.  He is taking hydroxyzine 10 mg which initially he felt better but now does not feel it is working as good.  He has no tremors, shakes or any EPS.  He is a scheduled to have his blood work in few weeks.  His appetite is okay.  His weight is stable and unchanged.   Past Psychiatric History: H/Omania and impulsive behavior. No h/opsychiatric inpatient treatment orsuicidal attempt. Tried Abilify and Geodon with poor outcome.  Psychiatric Specialty Exam: Physical  Exam  Review of Systems  Weight 195 lb (88.5 kg).Body mass index is 29.65 kg/m.  General Appearance: NA  Eye Contact:  NA  Speech:  Clear and Coherent and Normal Rate  Volume:  Normal  Mood:  Euthymic  Affect:  NA  Thought Process:  Goal Directed  Orientation:  Full (Time, Place, and Person)  Thought Content:  Logical  Suicidal Thoughts:  No  Homicidal Thoughts:  No  Memory:  Immediate;   Good Recent;   Good Remote;   Good  Judgement:  Intact  Insight:  Present  Psychomotor Activity:  NA  Concentration:  Concentration: Good and Attention Span: Good  Recall:  Good  Fund of Knowledge:  Good  Language:  Good  Akathisia:  No  Handed:  Right  AIMS (if indicated):     Assets:  Communication Skills Desire for Improvement Housing Resilience Social Support Talents/Skills Transportation  ADL's:  Intact  Cognition:  WNL  Sleep:   restless      Assessment and Plan: Bipolar disorder type I.  Anxiety.  Since we decreased the dose of olanzapine he is doing better but endorsed restlessness at night.  I recommend to try hydroxyzine 25 mg to help his sleep and restlessness and if that did not help then we may consider Requip.  Overall patient feels medicine is working and his mood is stable.  He denies any panic attack.  I will continue Zoloft 150  mg daily, olanzapine 20 mg at bedtime, Lamictal 300 mg daily and new dose of hydroxyzine 25 mg at bedtime.  Patient has no rash or any itching.  Recommended to call us back if is any question or any concern.  Follow-up in 2 months.  Follow Up Instructions:    I discussed the assessment and treatment plan with the patient. The patient was provided an opportunity to ask questions and all were answered. The patient agreed with the plan and demonstrated an understanding of the instructions.   The patient was advised to call back or seek an in-person evaluation if the symptoms worsen or if the condition fails to improve as anticipated.  I  provided 17 minutes of non-face-to-face time during this encounter.   Kathlee Nations, MD

## 2020-05-14 ENCOUNTER — Telehealth (HOSPITAL_COMMUNITY): Payer: Self-pay | Admitting: *Deleted

## 2020-05-14 ENCOUNTER — Other Ambulatory Visit (HOSPITAL_COMMUNITY): Payer: Self-pay | Admitting: *Deleted

## 2020-05-14 MED ORDER — ROPINIROLE HCL 0.5 MG PO TABS: 0.5000 mg | ORAL_TABLET | Freq: Every day | ORAL | 11 refills | Status: DC

## 2020-05-14 NOTE — Telephone Encounter (Signed)
Will do!

## 2020-05-14 NOTE — Telephone Encounter (Signed)
Recommended to stop the hydroxyzine.  Try Requip 0.5 mg to help his restless leg and insomnia.  Please call his pharmacy.  Thank you

## 2020-05-14 NOTE — Telephone Encounter (Signed)
Patient called and reported the hydroxyzine that he started to help him sleep has made things much worse for him.  He reported that now he is not sleeping at all and that he tosses and turns all night.  He would like to try something else.  His next appt is not until 06/20/20.  Please review.

## 2020-06-20 ENCOUNTER — Encounter: Payer: Self-pay | Admitting: Family Medicine

## 2020-06-20 ENCOUNTER — Ambulatory Visit (INDEPENDENT_AMBULATORY_CARE_PROVIDER_SITE_OTHER): Payer: BC Managed Care – PPO | Admitting: Family Medicine

## 2020-06-20 ENCOUNTER — Other Ambulatory Visit: Payer: Self-pay

## 2020-06-20 VITALS — BP 126/86 | HR 79 | Temp 98.0°F | Ht 68.0 in | Wt 205.4 lb

## 2020-06-20 DIAGNOSIS — Z125 Encounter for screening for malignant neoplasm of prostate: Secondary | ICD-10-CM

## 2020-06-20 DIAGNOSIS — Z0000 Encounter for general adult medical examination without abnormal findings: Secondary | ICD-10-CM | POA: Diagnosis not present

## 2020-06-20 DIAGNOSIS — F319 Bipolar disorder, unspecified: Secondary | ICD-10-CM | POA: Diagnosis not present

## 2020-06-20 DIAGNOSIS — E669 Obesity, unspecified: Secondary | ICD-10-CM | POA: Insufficient documentation

## 2020-06-20 DIAGNOSIS — E785 Hyperlipidemia, unspecified: Secondary | ICD-10-CM

## 2020-06-20 DIAGNOSIS — D126 Benign neoplasm of colon, unspecified: Secondary | ICD-10-CM

## 2020-06-20 LAB — CBC WITH DIFFERENTIAL/PLATELET
Basophils Absolute: 0 10*3/uL (ref 0.0–0.1)
Basophils Relative: 0.3 % (ref 0.0–3.0)
Eosinophils Absolute: 0.2 10*3/uL (ref 0.0–0.7)
Eosinophils Relative: 2.1 % (ref 0.0–5.0)
HCT: 39.2 % (ref 39.0–52.0)
Hemoglobin: 13.3 g/dL (ref 13.0–17.0)
Lymphocytes Relative: 27.2 % (ref 12.0–46.0)
Lymphs Abs: 2 10*3/uL (ref 0.7–4.0)
MCHC: 33.9 g/dL (ref 30.0–36.0)
MCV: 91 fl (ref 78.0–100.0)
Monocytes Absolute: 0.6 10*3/uL (ref 0.1–1.0)
Monocytes Relative: 8 % (ref 3.0–12.0)
Neutro Abs: 4.5 10*3/uL (ref 1.4–7.7)
Neutrophils Relative %: 62.4 % (ref 43.0–77.0)
Platelets: 211 10*3/uL (ref 150.0–400.0)
RBC: 4.3 Mil/uL (ref 4.22–5.81)
RDW: 14.5 % (ref 11.5–15.5)
WBC: 7.3 10*3/uL (ref 4.0–10.5)

## 2020-06-20 LAB — BASIC METABOLIC PANEL
BUN: 16 mg/dL (ref 6–23)
CO2: 28 mEq/L (ref 19–32)
Calcium: 9.7 mg/dL (ref 8.4–10.5)
Chloride: 104 mEq/L (ref 96–112)
Creatinine, Ser: 0.95 mg/dL (ref 0.40–1.50)
GFR: 90.12 mL/min (ref >60.00)
Glucose, Bld: 89 mg/dL (ref 70–99)
Potassium: 4.4 mEq/L (ref 3.5–5.1)
Sodium: 141 mEq/L (ref 135–145)

## 2020-06-20 LAB — PSA: PSA: 0.79 ng/mL (ref 0.10–4.00)

## 2020-06-20 LAB — LIPID PANEL
Cholesterol: 197 mg/dL (ref 0–200)
HDL: 30.9 mg/dL — ABNORMAL LOW (ref >39.00)
Total CHOL/HDL Ratio: 6
Triglycerides: 468 mg/dL — ABNORMAL HIGH (ref 0.0–149.0)

## 2020-06-20 LAB — HEPATIC FUNCTION PANEL
ALT: 48 U/L (ref 0–53)
AST: 25 U/L (ref 0–37)
Albumin: 4.6 g/dL (ref 3.5–5.2)
Alkaline Phosphatase: 88 U/L (ref 39–117)
Bilirubin, Direct: 0.1 mg/dL (ref 0.0–0.3)
Total Bilirubin: 0.4 mg/dL (ref 0.2–1.2)
Total Protein: 6.7 g/dL (ref 6.0–8.3)

## 2020-06-20 LAB — TSH: TSH: 2.8 u[IU]/mL (ref 0.35–4.50)

## 2020-06-20 LAB — LDL CHOLESTEROL, DIRECT: Direct LDL: 101 mg/dL

## 2020-06-20 MED ORDER — FENOFIBRATE 160 MG PO TABS
160.0000 mg | ORAL_TABLET | Freq: Every day | ORAL | 1 refills | Status: DC
Start: 2020-06-20 — End: 2020-12-20

## 2020-06-20 MED ORDER — ROSUVASTATIN CALCIUM 40 MG PO TABS: 40.0000 mg | ORAL_TABLET | Freq: Every day | ORAL | 1 refills | Status: DC

## 2020-06-20 NOTE — Patient Instructions (Addendum)
Follow up in 6 months to recheck cholesterol and weight loss progress We'll notify you of your lab results and make any changes if needed We'll call you with your GI appt for the repeat colonoscopy Call with any questions or concerns Stay Safe!  Stay Healthy! Happy Spring!!!

## 2020-06-20 NOTE — Progress Notes (Signed)
   Subjective:    Patient ID: Timothy Burnett, male    DOB: 08-08-1965, 55 y.o.   MRN: [id number]  HPI CPE- UTD on Tdap, Flu.  UTD on COVID.  Due for repeat colonoscopy.  No concerns today  Reviewed past medical, surgical, family and social histories.   Patient Care Team    Relationship Specialty Notifications Start End  Midge Minium, MD PCP - General Family Medicine  06/11/12   Adele Schilder, Arlyce Harman, MD Consulting Physician Psychiatry  01/03/15   Pyrtle, Lajuan Lines, MD Consulting Physician Gastroenterology  06/20/20     Health Maintenance  Topic Date Due  . HIV Screening  Never done  . INFLUENZA VACCINE  10/30/2019  . COVID-19 Vaccine (3 - Booster for Pfizer series) 01/04/2020  . COLONOSCOPY (Pts 45-70yrs Insurance coverage will need to be confirmed)  04/22/2020  . Hepatitis C Screening  10/17/2020 (Originally Jul 07, 1965)  . TETANUS/TDAP  01/03/2023  . HPV VACCINES  Aged Out      Review of Systems Patient reports no vision/hearing changes, anorexia, fever ,adenopathy, persistant/recurrent hoarseness, swallowing issues, chest pain, palpitations, edema, persistant/recurrent cough, hemoptysis, dyspnea (rest,exertional, paroxysmal nocturnal), gastrointestinal  bleeding (melena, rectal bleeding), abdominal pain, excessive heart burn, GU symptoms (dysuria, hematuria, voiding/incontinence issues) syncope, focal weakness, memory loss, numbness & tingling, skin/hair/nail changes, depression, anxiety, abnormal bruising/bleeding, musculoskeletal symptoms/signs.   Pt has gained 10 lbs since last visit- has started going to the gym regularly  This visit occurred during the SARS-CoV-2 public health emergency.  Safety protocols were in place, including screening questions prior to the visit, additional usage of staff PPE, and extensive cleaning of exam room while observing appropriate contact time as indicated for disinfecting solutions.       Objective:   Physical Exam General Appearance:    Alert,  cooperative, no distress, appears stated age  Head:    Normocephalic, without obvious abnormality, atraumatic  Eyes:    PERRL, conjunctiva/corneas clear, EOM's intact, fundi    benign, both eyes       Ears:    Normal TM's and external ear canals, both ears  Nose:   Nares normal, septum midline, mucosa normal, no drainage   or sinus tenderness  Throat:   Lips, mucosa, and tongue normal; teeth and gums normal  Neck:   Supple, symmetrical, trachea midline, no adenopathy;       thyroid:  No enlargement/tenderness/nodules  Back:     Symmetric, no curvature, ROM normal, no CVA tenderness  Lungs:     Clear to auscultation bilaterally, respirations unlabored  Chest wall:    No tenderness or deformity  Heart:    Regular rate and rhythm, S1 and S2 normal, no murmur, rub   or gallop  Abdomen:     Soft, non-tender, bowel sounds active all four quadrants,    no masses, no organomegaly  Genitalia:    deferred  Rectal:    Extremities:   Extremities normal, atraumatic, no cyanosis or edema  Pulses:   2+ and symmetric all extremities  Skin:   Skin color, texture, turgor normal, no rashes or lesions  Lymph nodes:   Cervical, supraclavicular, and axillary nodes normal  Neurologic:   CNII-XII intact. Normal strength, sensation and reflexes      throughout          Assessment & Plan:

## 2020-06-20 NOTE — Assessment & Plan Note (Signed)
Pt has gained 10 lbs since last visit but he had his wife have joined a gym and have started doing Cardio.  Check labs to risk stratify.  Will follow.

## 2020-06-20 NOTE — Assessment & Plan Note (Signed)
Pt's PE WNL w/ exception of obesity.  UTD on immunizations.  Overdue for recall colonoscopy.  Check labs.  Anticipatory guidance provided.

## 2020-06-20 NOTE — Assessment & Plan Note (Signed)
Pt continues to follow w/ Dr Adele Schilder.  Sxs are currently well controlled.  Will continue to follow.

## 2020-07-11 IMAGING — DX DG HAND COMPLETE 3+V*L*
3 series · 3 of 3 positions shown · non-contrast
Comparison: None.

CLINICAL DATA: LATERAL LEFT hand and wrist pain over the past 5
days. No known injuries.

EXAM:
LEFT HAND - COMPLETE 3+ VIEW

[hand ap]
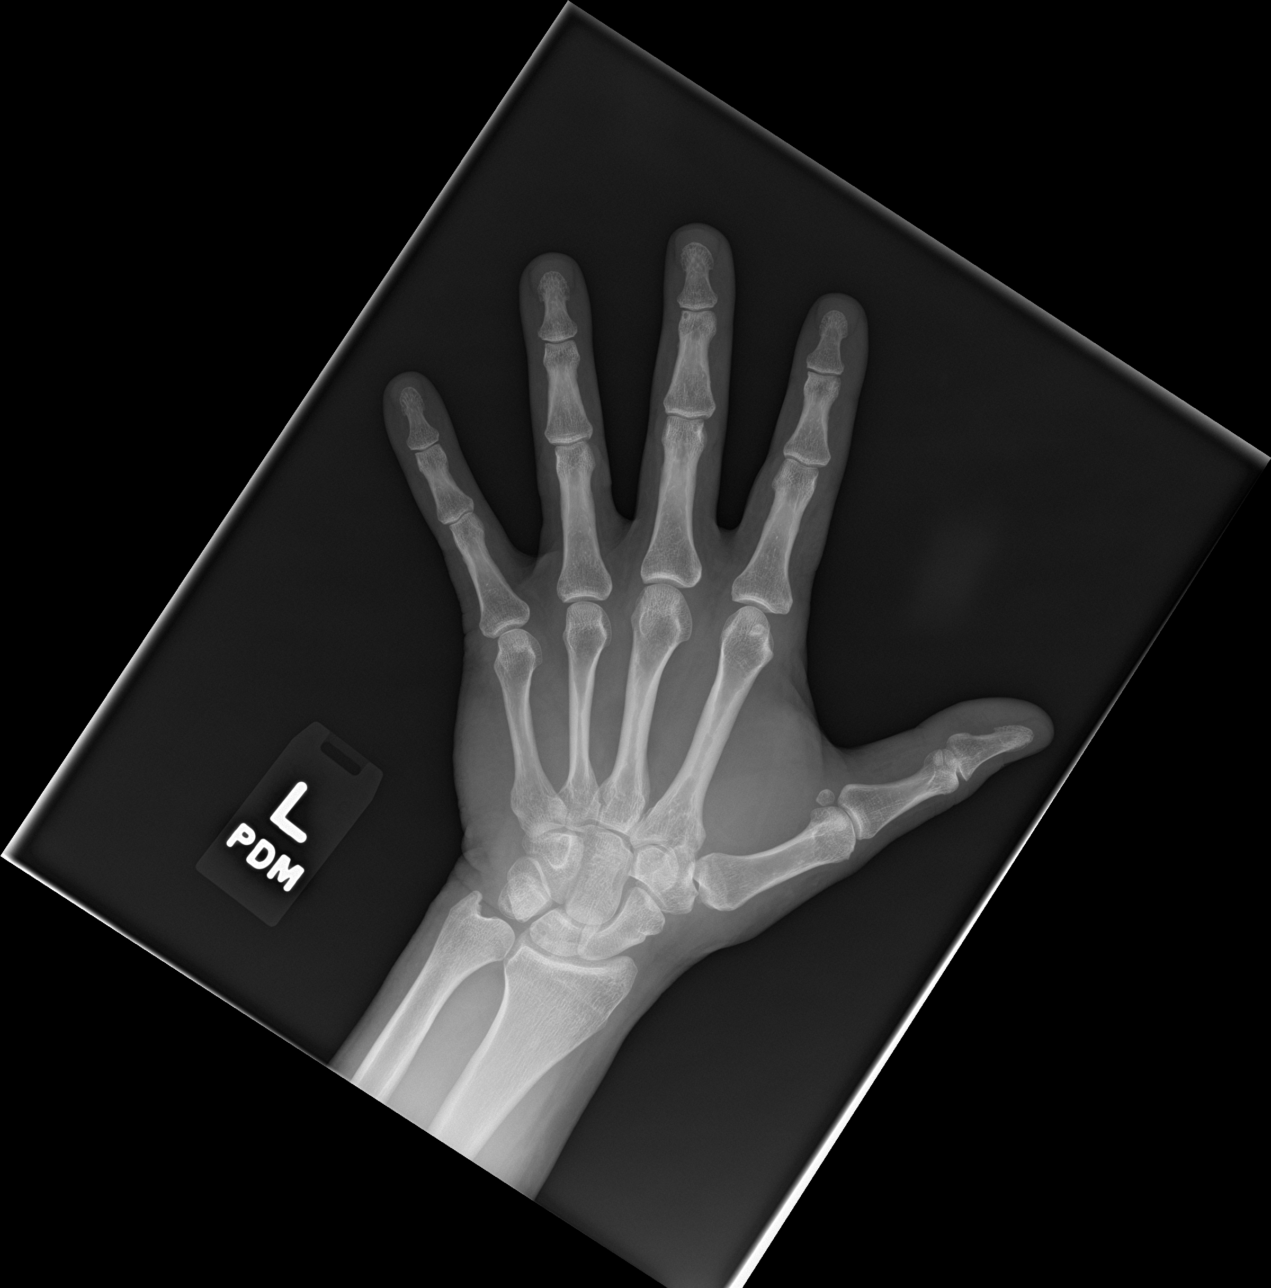

[hand obl]
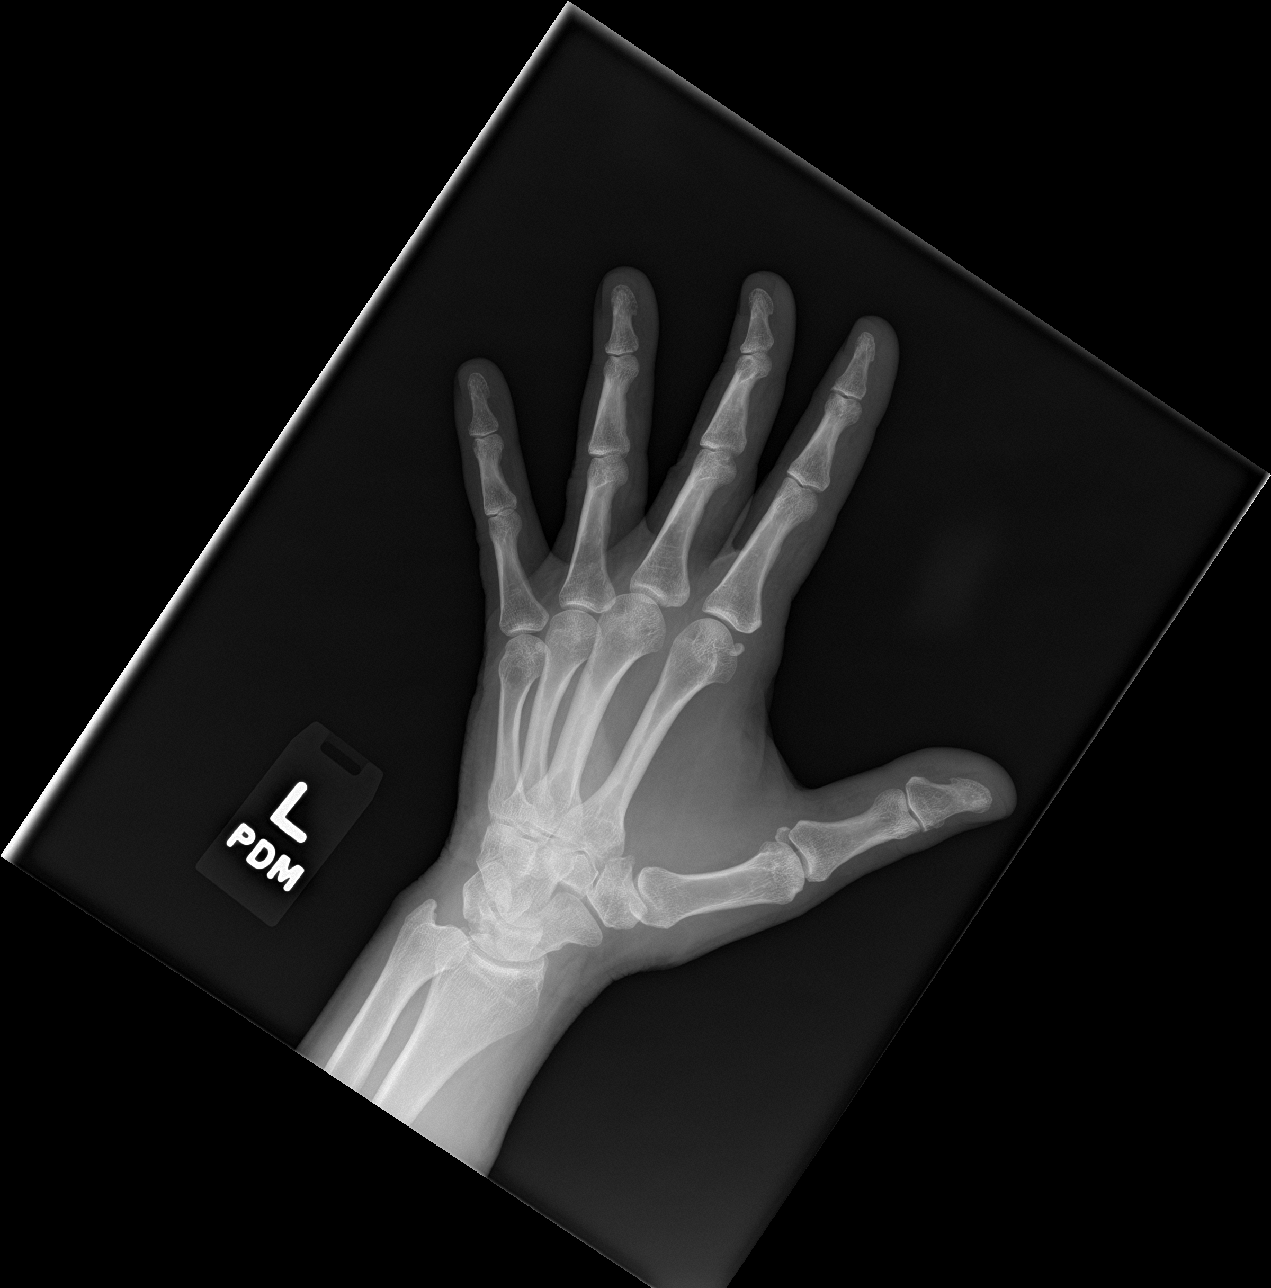

[hand lat]
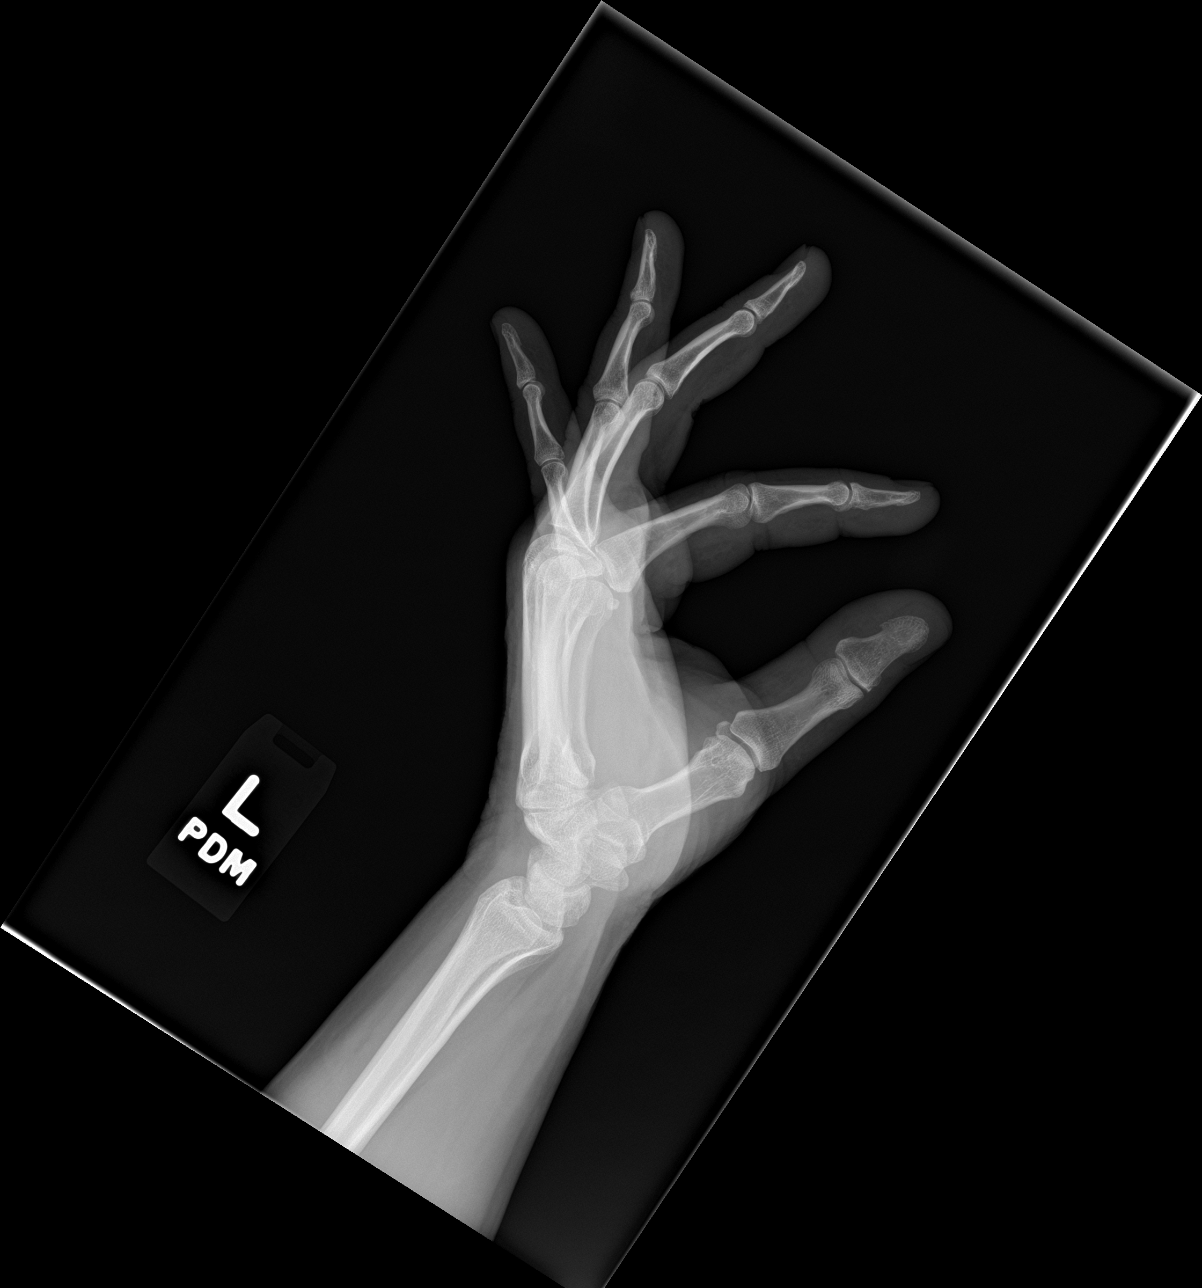

[3 of 3 positions shown; findings below may reference images not displayed]

FINDINGS: No evidence of acute fracture or dislocation. Joint spaces well
preserved. Well-preserved bone mineral density. No intrinsic osseous
abnormalities.
IMPRESSION: Normal examination.

## 2020-07-11 IMAGING — DX DG WRIST COMPLETE 3+V*L*
4 series · 4 of 4 positions shown · non-contrast
Comparison: None.

CLINICAL DATA: LATERAL LEFT hand and wrist pain over the past 5
days. No known injuries.

EXAM:
LEFT WRIST - COMPLETE 3+ VIEW

[wrist pa]
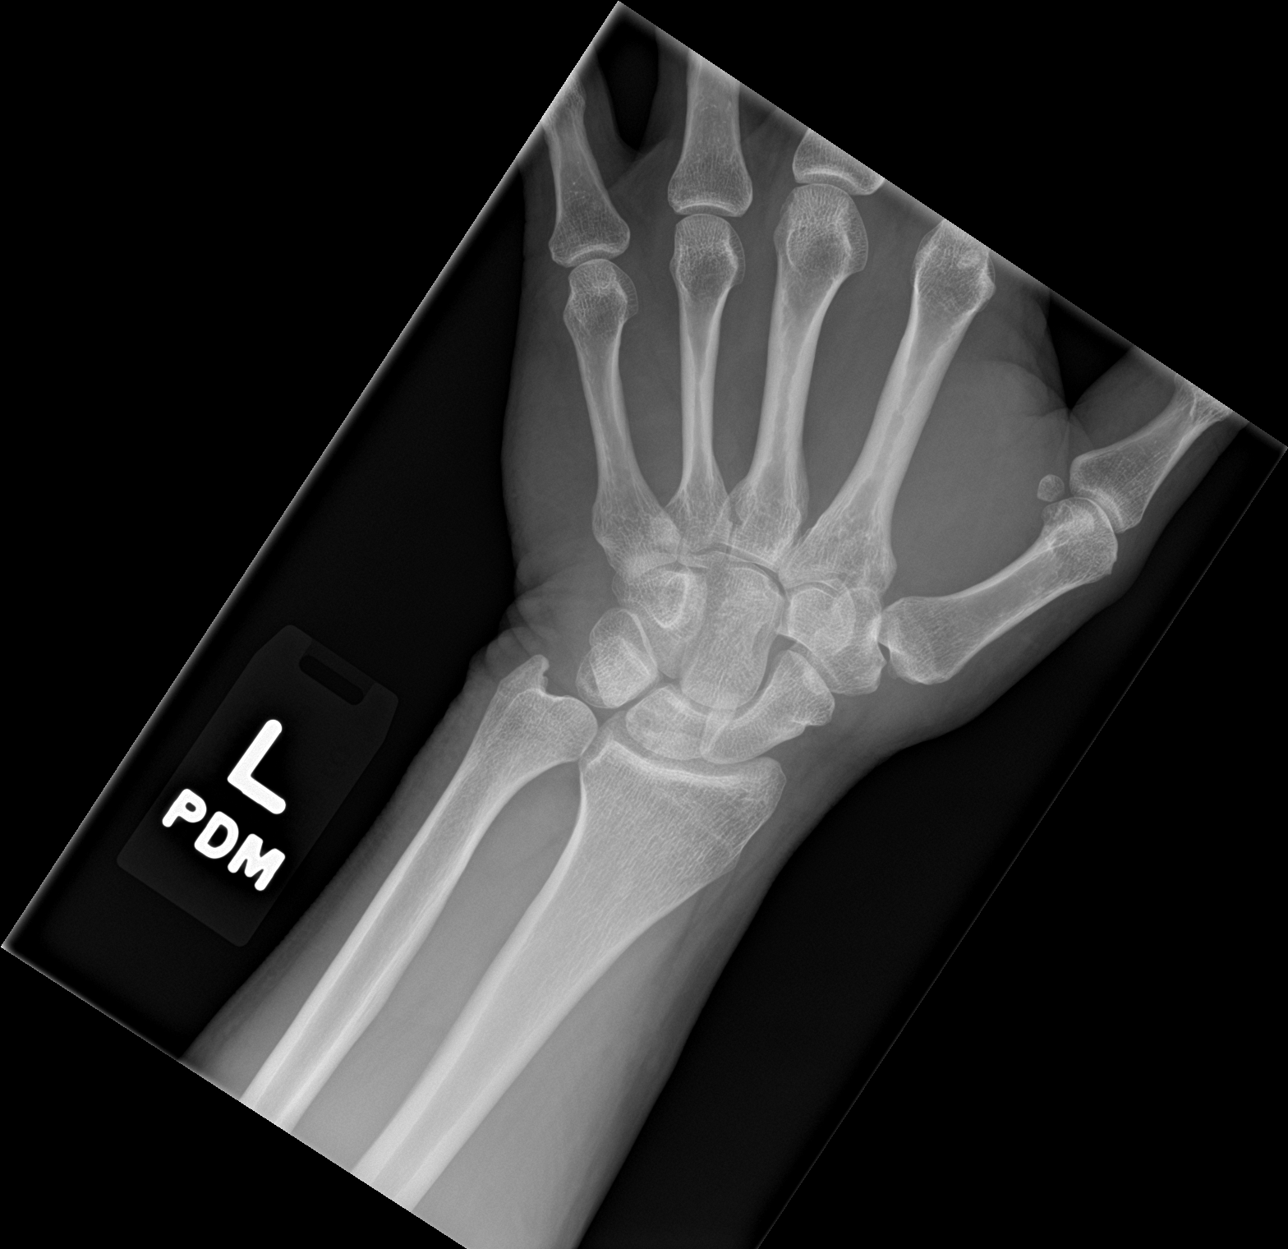

[wrist obl]
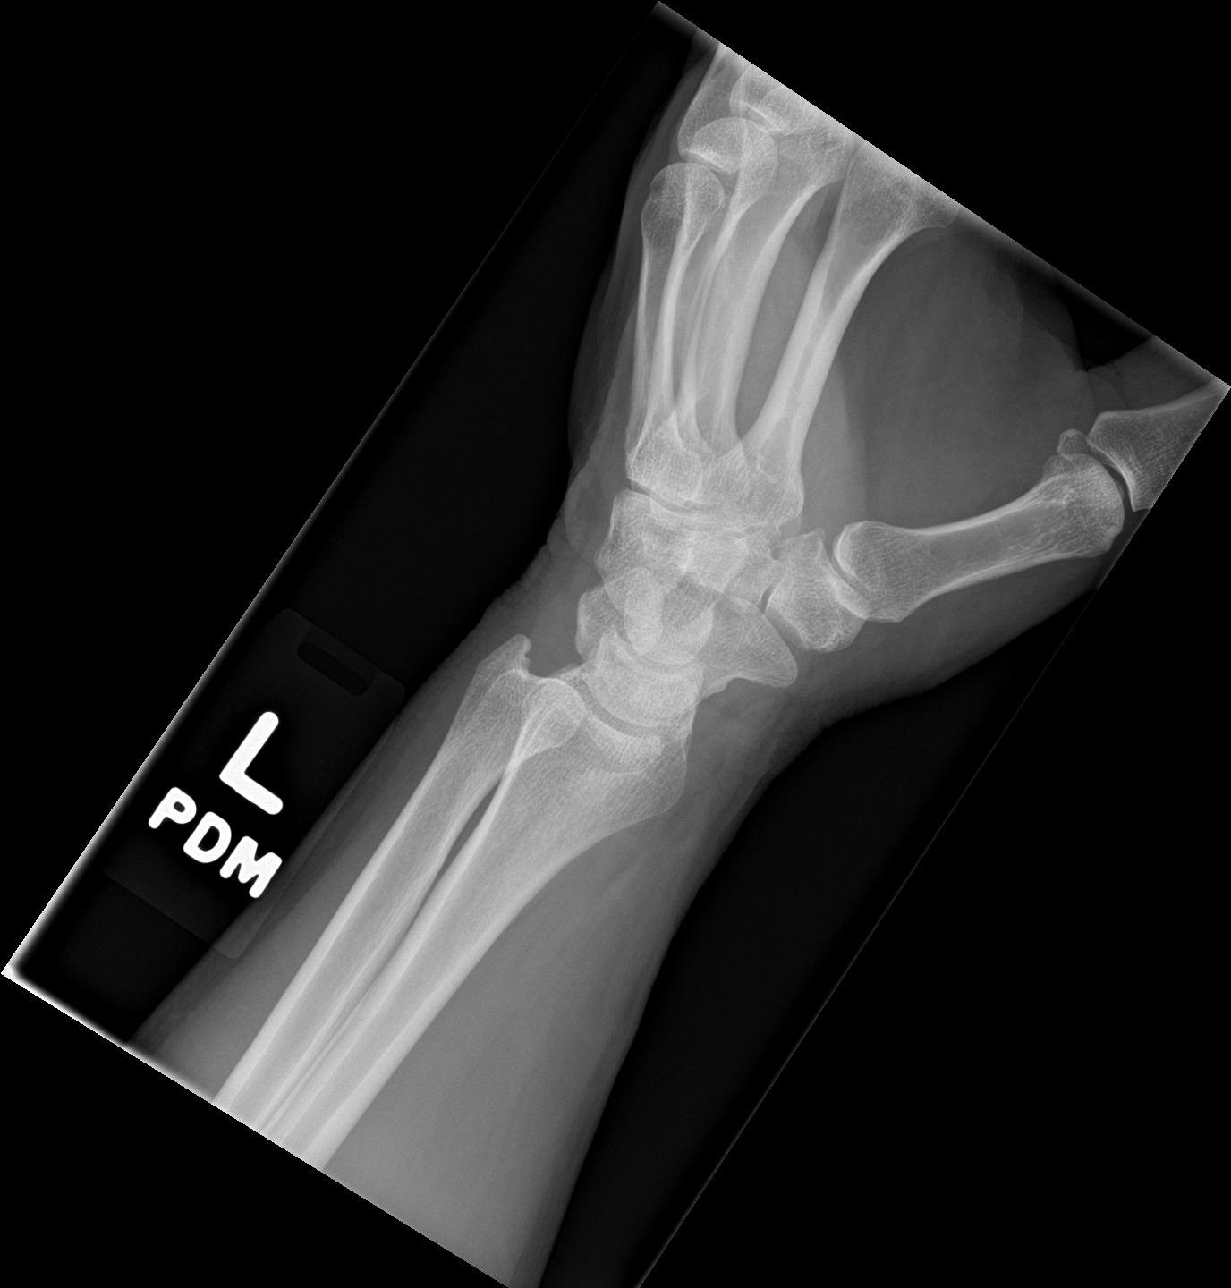

[wrist lat]
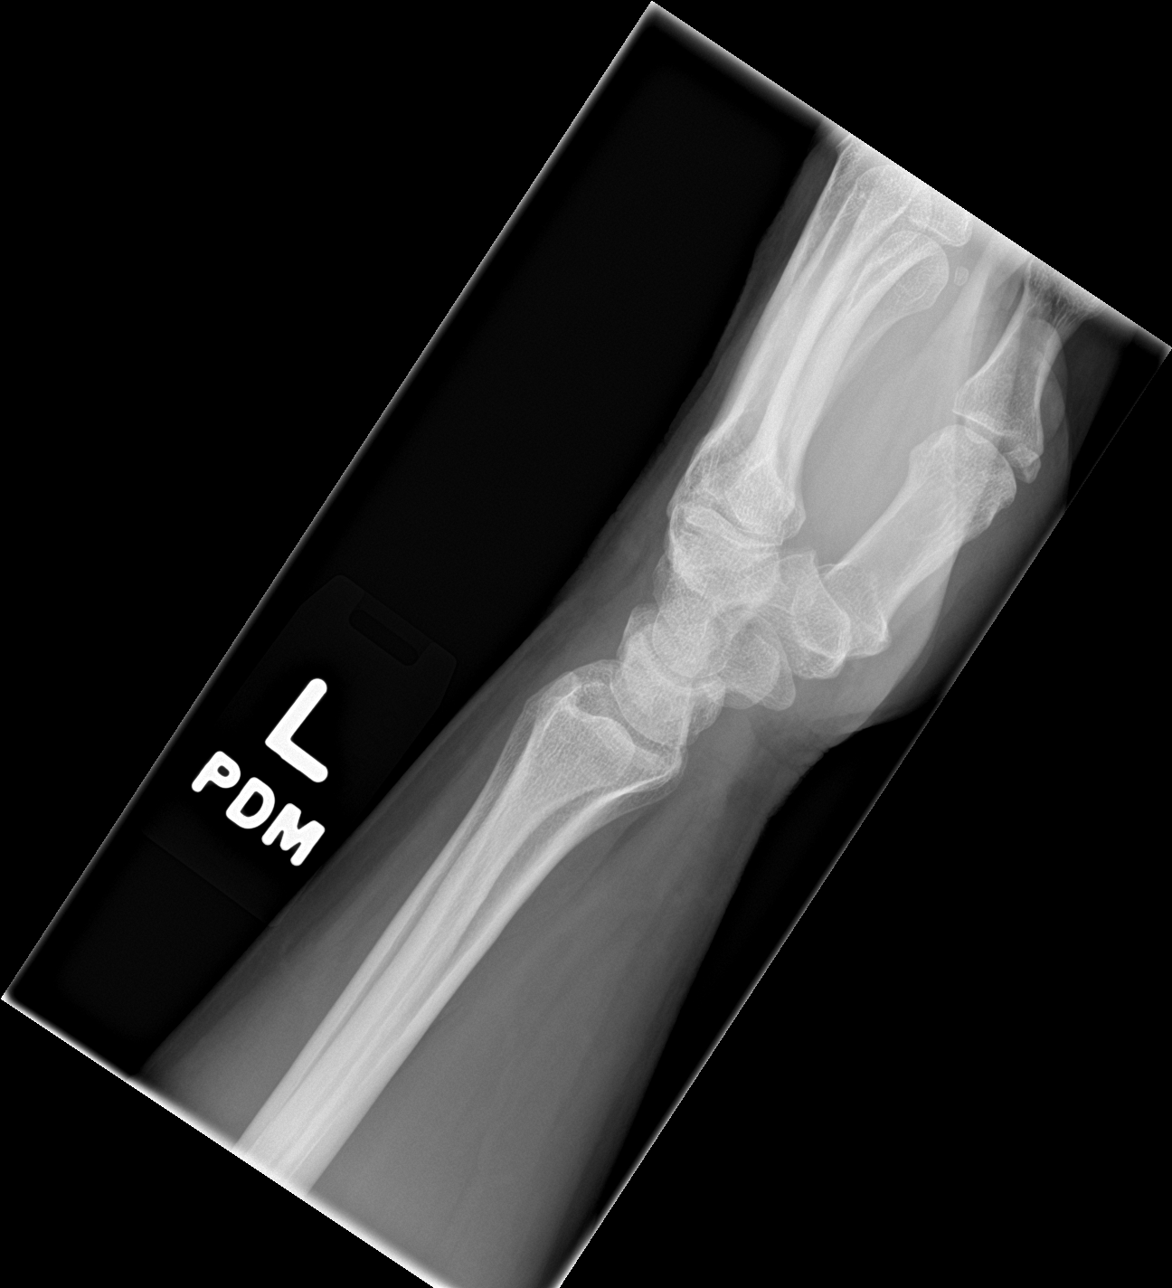

[navicular]
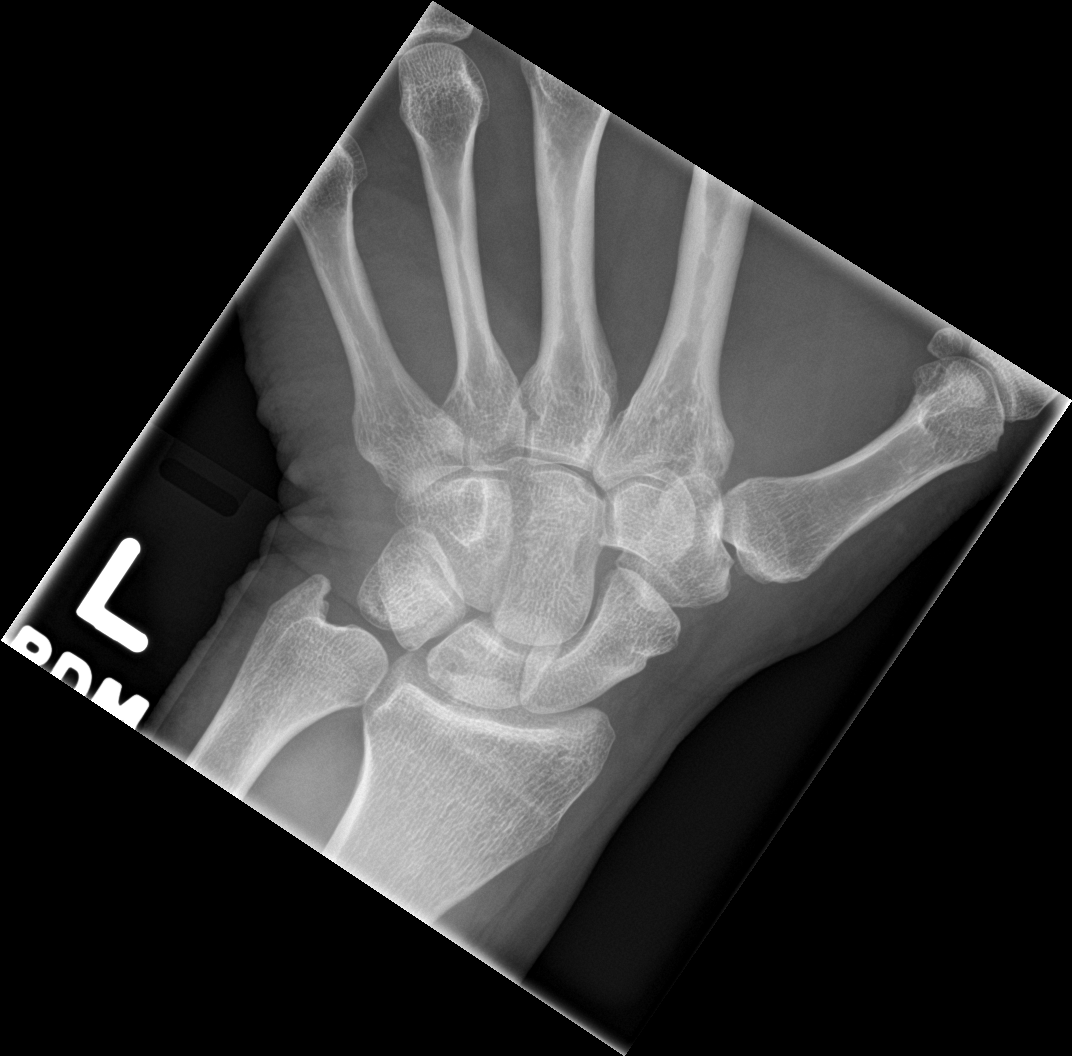

[4 of 4 positions shown; findings below may reference images not displayed]

FINDINGS: No evidence of acute fracture or dislocation. Joint spaces well
preserved. Well-preserved bone mineral density. No intrinsic osseous
abnormalities.
IMPRESSION: Normal examination.

## 2020-07-18 ENCOUNTER — Other Ambulatory Visit (HOSPITAL_COMMUNITY): Payer: Self-pay | Admitting: Psychiatry

## 2020-07-18 DIAGNOSIS — F319 Bipolar disorder, unspecified: Secondary | ICD-10-CM

## 2020-07-27 ENCOUNTER — Other Ambulatory Visit (HOSPITAL_COMMUNITY): Payer: Self-pay | Admitting: Psychiatry

## 2020-07-27 DIAGNOSIS — F319 Bipolar disorder, unspecified: Secondary | ICD-10-CM

## 2020-08-03 ENCOUNTER — Other Ambulatory Visit: Payer: Self-pay

## 2020-08-03 ENCOUNTER — Telehealth (INDEPENDENT_AMBULATORY_CARE_PROVIDER_SITE_OTHER): Payer: BC Managed Care – PPO | Admitting: Psychiatry

## 2020-08-03 ENCOUNTER — Encounter (HOSPITAL_COMMUNITY): Payer: Self-pay | Admitting: Psychiatry

## 2020-08-03 VITALS — Wt 200.0 lb

## 2020-08-03 DIAGNOSIS — F319 Bipolar disorder, unspecified: Secondary | ICD-10-CM

## 2020-08-03 DIAGNOSIS — F419 Anxiety disorder, unspecified: Secondary | ICD-10-CM

## 2020-08-03 DIAGNOSIS — G2581 Restless legs syndrome: Secondary | ICD-10-CM | POA: Diagnosis not present

## 2020-08-03 MED ORDER — LAMOTRIGINE 150 MG PO TABS: 150.0000 mg | ORAL_TABLET | Freq: Two times a day (BID) | ORAL | 0 refills | Status: DC

## 2020-08-03 MED ORDER — OLANZAPINE 15 MG PO TABS: 15.0000 mg | ORAL_TABLET | Freq: Every day | ORAL | 0 refills | Status: DC

## 2020-08-03 MED ORDER — SERTRALINE HCL 100 MG PO TABS: 150.0000 mg | ORAL_TABLET | Freq: Every day | ORAL | 0 refills | Status: DC

## 2020-08-03 MED ORDER — ROPINIROLE HCL 0.5 MG PO TABS: 0.5000 mg | ORAL_TABLET | Freq: Every day | ORAL | 0 refills | Status: DC

## 2020-08-03 NOTE — Progress Notes (Signed)
Virtual Visit via Telephone Note  I connected with Timothy Burnett on 08/03/20 at  8:20 AM EDT by telephone and verified that I am speaking with the correct person using two identifiers.  Location: Patient: work Provider: home office   I discussed the limitations, risks, security and privacy concerns of performing an evaluation and management service by telephone and the availability of in person appointments. I also discussed with the patient that there may be a patient responsible charge related to this service. The patient expressed understanding and agreed to proceed.   History of Present Illness: Patient is evaluated by phone session.  He is taking his medication as prescribed.  We started him on hydroxyzine because of insomnia but it did not help and make it worse.  We recommended to try Requip and he is taking 0.5 mg that is helping his sleep.  He reported Requip helping and he does not have restless leg at night.  He sleeps good.  Denies any irritability, mania, anger or any suicidal thoughts.  She likes his job.  He is not working in [employer].  The relationship with his wife is good.  He denies any suicidal thoughts or homicidal thoughts.  Recently he had a blood work and his triglycerides were high.  His weight was also high but he is started [place] and hoping [place].  He has no tremors, shakes or any EPS.  Denies any panic attack.  Past Psychiatric History: H/Omania and impulsive behavior. No h/opsychiatric inpatient treatment orsuicidal attempt. Tried Abilify and Geodon with poor outcome. Vistaril made groggy.    Recent Results (from the past 2160 hour(s))  Lipid panel     Status: Abnormal   Collection Time: 06/20/20 12:58 PM  Result Value Ref Range   Cholesterol 197 0 - 200 mg/dL    Comment: ATP III Classification       Desirable:  < 200 mg/dL               Borderline High:  200 - 239 mg/dL          High:  > = 240 mg/dL   Triglycerides (H) 0.0 - 149.0 mg/dL     468.0 Triglyceride is over 400; calculations on Lipids are invalid.    Comment: Normal:  <150 mg/dLBorderline High:  150 - 199 mg/dL   HDL 30.90 (L) >39.00 mg/dL   Total CHOL/HDL Ratio 6     Comment:                Men          Women1/2 Average Risk     3.4          3.3Average Risk          5.0          4.42X Average Risk          9.6          7.13X Average Risk          15.0          11.0                      Basic metabolic panel     Status: None   Collection Time: 06/20/20 12:58 PM  Result Value Ref Range   Sodium 141 135 - 145 mEq/L   Potassium 4.4 3.5 - 5.1 mEq/L   Chloride 104 96 - 112 mEq/L   CO2 28 19 - 32 mEq/L  Glucose, Bld 89 70 - 99 mg/dL   BUN 16 6 - 23 mg/dL   Creatinine, Ser 0.95 0.40 - 1.50 mg/dL   GFR 90.12 >60.00 mL/min    Comment: Calculated using the CKD-EPI Creatinine Equation (2021)   Calcium 9.7 8.4 - 10.5 mg/dL  TSH     Status: None   Collection Time: 06/20/20 12:58 PM  Result Value Ref Range   TSH 2.80 0.35 - 4.50 uIU/mL  Hepatic function panel     Status: None   Collection Time: 06/20/20 12:58 PM  Result Value Ref Range   Total Bilirubin 0.4 0.2 - 1.2 mg/dL   Bilirubin, Direct 0.1 0.0 - 0.3 mg/dL   Alkaline Phosphatase 88 39 - 117 U/L   AST 25 0 - 37 U/L   ALT 48 0 - 53 U/L   Total Protein 6.7 6.0 - 8.3 g/dL   Albumin 4.6 3.5 - 5.2 g/dL  CBC with Differential/Platelet     Status: None   Collection Time: 06/20/20 12:58 PM  Result Value Ref Range   WBC 7.3 4.0 - 10.5 K/uL   RBC 4.30 4.22 - 5.81 Mil/uL   Hemoglobin 13.3 13.0 - 17.0 g/dL   HCT 39.2 39.0 - 52.0 %   MCV 91.0 78.0 - 100.0 fl   MCHC 33.9 30.0 - 36.0 g/dL   RDW 14.5 11.5 - 15.5 %   Platelets 211.0 150.0 - 400.0 K/uL   Neutrophils Relative % 62.4 43.0 - 77.0 %   Lymphocytes Relative 27.2 12.0 - 46.0 %   Monocytes Relative 8.0 3.0 - 12.0 %   Eosinophils Relative 2.1 0.0 - 5.0 %   Basophils Relative 0.3 0.0 - 3.0 %   Neutro Abs 4.5 1.4 - 7.7 K/uL   Lymphs Abs 2.0 0.7 - 4.0 K/uL    Monocytes Absolute 0.6 0.1 - 1.0 K/uL   Eosinophils Absolute 0.2 0.0 - 0.7 K/uL   Basophils Absolute 0.0 0.0 - 0.1 K/uL  PSA     Status: None   Collection Time: 06/20/20 12:58 PM  Result Value Ref Range   PSA 0.79 0.10 - 4.00 ng/mL    Comment: Test performed using Access [employer]br[employer]ch PSA Assay, a parmagnetic partical, chemiluminecent immunoassay.  LDL cholesterol, direct     Status: None   Collection Time: 06/20/20 12:58 PM  Result Value Ref Range   Direct LDL 101.0 mg/dL    Comment: Optimal:  <100 mg/dLNear or Above Optimal:  100-129 mg/dLBorderline High:  130-159 mg/dLHigh:  160-189 mg/dLVery High:  >190 mg/dL    Psychiatric Specialty Exam: Physical Exam  Review of Systems  Weight 200 lb (90.7 kg).Body mass index is 30.41 kg/m.  General Appearance: NA  Eye Contact:  NA  Speech:  Normal Rate  Volume:  Normal  Mood:  Euthymic  Affect:  NA  Thought Process:  Goal Directed  Orientation:  Full (Time, Place, and Person)  Thought Content:  Logical  Suicidal Thoughts:  No  Homicidal Thoughts:  No  Memory:  Immediate;   Good Recent;   Good Remote;   Good  Judgement:  Intact  Insight:  Present  Psychomotor Activity:  NA  Concentration:  Concentration: Good and Attention Span: Good  Recall:  Good  Fund of Knowledge:  Good  Language:  Good  Akathisia:  No  Handed:  Right  AIMS (if indicated):     Assets:  Communication Skills Desire for Improvement Housing Resilience Social Support Talents/Skills Transportation  ADL's:  Intact  Cognition:  WNL  Sleep:   good      Assessment and Plan: Bipolar disorder type I.  Anxiety.  Restless leg.  He like Requip which is helping his restless leg and like to continue it.  I reviewed blood work results.  He is still have high triglycerides.  I recommend to try reducing the olanzapine dose as patient doing very well in controlling his mood symptoms.  He agreed to give a try.  We will try olanzapine 15 mg at bedtime, continue Zoloft 150  mg daily and Lamictal 300 mg daily.  He has no rash, itching, tremors or shakes.  Encourage healthy lifestyle and watch his caloric intake.  Recommended to call us back with any question or any concern.  Follow-up in 3 months.  Follow Up Instructions:    I discussed the assessment and treatment plan with the patient. The patient was provided an opportunity to ask questions and all were answered. The patient agreed with the plan and demonstrated an understanding of the instructions.   The patient was advised to call back or seek an in-person evaluation if the symptoms worsen or if the condition fails to improve as anticipated.  I provided 18 minutes of non-face-to-face time during this encounter.   Kathlee Nations, MD

## 2020-09-26 ENCOUNTER — Encounter: Payer: Self-pay | Admitting: Internal Medicine

## 2020-09-26 ENCOUNTER — Encounter: Payer: Self-pay | Admitting: *Deleted

## 2020-10-16 ENCOUNTER — Other Ambulatory Visit (HOSPITAL_COMMUNITY): Payer: Self-pay | Admitting: Psychiatry

## 2020-10-16 DIAGNOSIS — F319 Bipolar disorder, unspecified: Secondary | ICD-10-CM

## 2020-10-16 DIAGNOSIS — F419 Anxiety disorder, unspecified: Secondary | ICD-10-CM

## 2020-11-01 ENCOUNTER — Other Ambulatory Visit (HOSPITAL_COMMUNITY): Payer: Self-pay | Admitting: Psychiatry

## 2020-11-01 DIAGNOSIS — F319 Bipolar disorder, unspecified: Secondary | ICD-10-CM

## 2020-11-07 ENCOUNTER — Other Ambulatory Visit: Payer: Self-pay

## 2020-11-07 ENCOUNTER — Encounter (HOSPITAL_COMMUNITY): Payer: Self-pay | Admitting: Psychiatry

## 2020-11-07 ENCOUNTER — Telehealth (INDEPENDENT_AMBULATORY_CARE_PROVIDER_SITE_OTHER): Payer: BC Managed Care – PPO | Admitting: Psychiatry

## 2020-11-07 VITALS — Wt 185.0 lb

## 2020-11-07 DIAGNOSIS — F319 Bipolar disorder, unspecified: Secondary | ICD-10-CM

## 2020-11-07 DIAGNOSIS — G2581 Restless legs syndrome: Secondary | ICD-10-CM

## 2020-11-07 DIAGNOSIS — F419 Anxiety disorder, unspecified: Secondary | ICD-10-CM | POA: Diagnosis not present

## 2020-11-07 MED ORDER — LAMOTRIGINE 150 MG PO TABS: 150.0000 mg | ORAL_TABLET | Freq: Two times a day (BID) | ORAL | 0 refills | Status: DC

## 2020-11-07 MED ORDER — SERTRALINE HCL 100 MG PO TABS: 150.0000 mg | ORAL_TABLET | Freq: Every day | ORAL | 0 refills | Status: DC

## 2020-11-07 MED ORDER — OLANZAPINE 15 MG PO TABS: 15.0000 mg | ORAL_TABLET | Freq: Every day | ORAL | 0 refills | Status: DC

## 2020-11-07 MED ORDER — TRAZODONE HCL 50 MG PO TABS: 25.0000 mg | ORAL_TABLET | Freq: Every evening | ORAL | 0 refills | Status: DC | PRN

## 2020-11-07 NOTE — Progress Notes (Signed)
Virtual Visit via Telephone Note  I connected with Sheffield Slider on 11/07/20 at  8:20 AM EDT by telephone and verified that I am speaking with the correct person using two identifiers.  Location: Patient: Home Provider: Home Office   I discussed the limitations, risks, security and privacy concerns of performing an evaluation and management service by telephone and the availability of in person appointments. I also discussed with the patient that there may be a patient responsible charge related to this service. The patient expressed understanding and agreed to proceed.   History of Present Illness: Patient is evaluated by phone session.  He stopped taking Requip because it was causing sleepwalking.  He is still have some nights when he feels restless and not able to sleep.  He has been laid off from the work due to job elimination as company has a short fall.  He is looking for a new job and he had 2 interviews and is very hopeful.  He feels the medicine is helping and he denies any irritability, mania, psychosis, anger or any impulsive behavior.  He lives with his wife who is very supportive.  He also endorsed more than 10 pounds weight loss and he feels good about it.  We have cut down the olanzapine dose on the last visit as he has a high triglycerides.  He has next physical and blood work coming up in October.  He like to keep his current medication but wondering if he can add something to help with sleep.  He has never tried trazodone and we will try that.     Past Psychiatric History:  H/O mania and impulsive behavior.  No h/o psychiatric inpatient treatment or suicidal attempt.  Tried Abilify and Geodon with poor outcome. Vistaril made groggy.   Psychiatric Specialty Exam: Physical Exam  Review of Systems  Weight 185 lb (83.9 kg).There is no height or weight on file to calculate BMI.  General Appearance: NA  Eye Contact:  NA  Speech:  Normal Rate  Volume:  Normal  Mood:  Anxious   Affect:  NA  Thought Process:  Goal Directed  Orientation:  Full (Time, Place, and Person)  Thought Content:  Rumination  Suicidal Thoughts:  No  Homicidal Thoughts:  No  Memory:  Immediate;   Good Recent;   Good Remote;   Good  Judgement:  Intact  Insight:  Present  Psychomotor Activity:  NA  Concentration:  Concentration: Good and Attention Span: Good  Recall:  Good  Fund of Knowledge:  Good  Language:  Good  Akathisia:  No  Handed:  Right  AIMS (if indicated):     Assets:  Communication Skills Desire for Improvement Housing Resilience Social Support  ADL's:  Intact  Cognition:  WNL  Sleep:   fair, at times restless      Assessment and Plan: Bipolar disorder type I.  Anxiety.  Restless leg.  Patient had lost weight since the last visit.  He is taking all his medication except Requip which he stated causing sleepwalking.  Discontinue Requip and we will try low-dose trazodone 25-50 mg at bedtime and we will provide a 30-day supply however if patient responded well that he can call for more refills.  We will continue Zoloft 150 mg daily and lamotrigine 300 mg daily and olanzapine 15 mg at bedtime.  Encourage continuing to watch his calorie intake and regular exercise.  Recommended to call us back if there is any question or any concern.  He  has no rash or any itching.  Follow-up in 3 months.  Follow Up Instructions:    I discussed the assessment and treatment plan with the patient. The patient was provided an opportunity to ask questions and all were answered. The patient agreed with the plan and demonstrated an understanding of the instructions.   The patient was advised to call back or seek an in-person evaluation if the symptoms worsen or if the condition fails to improve as anticipated.  I provided 16 minutes of non-face-to-face time during this encounter.   Kathlee Nations, MD

## 2020-12-13 ENCOUNTER — Encounter: Payer: Self-pay | Admitting: Internal Medicine

## 2020-12-20 ENCOUNTER — Other Ambulatory Visit: Payer: Self-pay

## 2020-12-20 ENCOUNTER — Encounter: Payer: Self-pay | Admitting: Family Medicine

## 2020-12-20 DIAGNOSIS — E785 Hyperlipidemia, unspecified: Secondary | ICD-10-CM

## 2020-12-20 MED ORDER — ROSUVASTATIN CALCIUM 40 MG PO TABS: 40.0000 mg | ORAL_TABLET | Freq: Every day | ORAL | 1 refills | Status: DC

## 2020-12-20 MED ORDER — FENOFIBRATE 160 MG PO TABS: 160.0000 mg | ORAL_TABLET | Freq: Every day | ORAL | 1 refills | Status: DC

## 2020-12-26 ENCOUNTER — Other Ambulatory Visit (HOSPITAL_COMMUNITY): Payer: Self-pay | Admitting: Psychiatry

## 2020-12-26 DIAGNOSIS — F319 Bipolar disorder, unspecified: Secondary | ICD-10-CM

## 2020-12-26 DIAGNOSIS — F419 Anxiety disorder, unspecified: Secondary | ICD-10-CM

## 2021-01-02 ENCOUNTER — Encounter: Payer: Self-pay | Admitting: Internal Medicine

## 2021-01-02 ENCOUNTER — Other Ambulatory Visit: Payer: Self-pay

## 2021-01-02 ENCOUNTER — Ambulatory Visit (AMBULATORY_SURGERY_CENTER): Payer: BC Managed Care – PPO

## 2021-01-02 VITALS — Ht 68.0 in | Wt 200.0 lb

## 2021-01-02 DIAGNOSIS — Z86010 Personal history of colonic polyps: Secondary | ICD-10-CM

## 2021-01-02 MED ORDER — NA SULFATE-K SULFATE-MG SULF 17.5-3.13-1.6 GM/177ML PO SOLN
1.0000 | Freq: Once | ORAL | 0 refills | Status: AC
Start: 2021-01-02 — End: 2021-01-02

## 2021-01-02 NOTE — Progress Notes (Signed)

## 2021-01-11 ENCOUNTER — Telehealth: Payer: Self-pay | Admitting: Internal Medicine

## 2021-01-11 DIAGNOSIS — Z86010 Personal history of colonic polyps: Secondary | ICD-10-CM

## 2021-01-11 MED ORDER — SUTAB 1479-225-188 MG PO TABS: 24.0000 | ORAL_TABLET | ORAL | 0 refills | Status: DC

## 2021-01-11 MED ORDER — ONDANSETRON HCL 4 MG PO TABS: 4.0000 mg | ORAL_TABLET | ORAL | 0 refills | Status: DC

## 2021-01-11 NOTE — Telephone Encounter (Signed)
Pt called asking if there was another colon prep he could use besides the Suprep.  Please call and advise.  Thank you.

## 2021-01-11 NOTE — Telephone Encounter (Signed)
Pt calling - states he vomits with Suprep- states he told the [facility] nurse this and asked for the tablets a was told she had to give what insurance would cover  Sent in script for Sutab andZofran- pt andI discussed Sutab prep 2 day- pt will come pick up Sutab coupon and new instructions today 3rd floor

## 2021-01-18 ENCOUNTER — Ambulatory Visit (AMBULATORY_SURGERY_CENTER): Payer: BC Managed Care – PPO | Admitting: Internal Medicine

## 2021-01-18 ENCOUNTER — Other Ambulatory Visit: Payer: Self-pay

## 2021-01-18 ENCOUNTER — Encounter: Payer: Self-pay | Admitting: Internal Medicine

## 2021-01-18 VITALS — BP 131/73 | HR 63 | Temp 98.2°F | Resp 13 | Ht 68.0 in | Wt 200.0 lb

## 2021-01-18 DIAGNOSIS — Z86010 Personal history of colonic polyps: Secondary | ICD-10-CM | POA: Diagnosis not present

## 2021-01-18 DIAGNOSIS — D123 Benign neoplasm of transverse colon: Secondary | ICD-10-CM | POA: Diagnosis not present

## 2021-01-18 DIAGNOSIS — Z1211 Encounter for screening for malignant neoplasm of colon: Secondary | ICD-10-CM | POA: Diagnosis not present

## 2021-01-18 MED ORDER — SODIUM CHLORIDE 0.9 % IV SOLN: 500.0000 mL | Freq: Once | INTRAVENOUS | Status: DC

## 2021-01-18 NOTE — Op Note (Signed)
[place] Patient Name: Timothy Burnett Procedure Date: 01/18/2021 8:38 AM MRN: [id number] Endoscopist: Jerene Bears , MD Age: 55 Referring MD:  Date of Birth: [birth year] Gender: Male Account #: [id number] Procedure:                Colonoscopy Indications:              High risk colon cancer surveillance: Personal                            history of sessile serrated colon polyps (less than                            10 mm in size) with no dysplasia at last                            colonoscopy in Jan 2017 Medicines:                Monitored Anesthesia Care Procedure:                Pre-Anesthesia Assessment:                           - Prior to the procedure, a History and Physical                            was performed, and patient medications and                            allergies were reviewed. The patient's tolerance of                            previous anesthesia was also reviewed. The risks                            and benefits of the procedure and the sedation                            options and risks were discussed with the patient.                            All questions were answered, and informed consent                            was obtained. Prior Anticoagulants: The patient has                            taken no previous anticoagulant or antiplatelet                            agents. ASA Grade Assessment: II - A patient with                            mild systemic disease. After reviewing the risks  and benefits, the patient was deemed in                            satisfactory condition to undergo the procedure.                           After obtaining informed consent, the colonoscope                            was passed under direct vision. Throughout the                            procedure, the patient's blood pressure, pulse, and                            oxygen saturations were monitored continuously. The                             Olympus CF-HQ190L [id number]) Colonoscope was                            introduced through the anus and advanced to the                            cecum, identified by appendiceal orifice and                            ileocecal valve. The colonoscopy was performed                            without difficulty. The patient tolerated the                            procedure well. The quality of the bowel                            preparation was good. The ileocecal valve,                            appendiceal orifice, and rectum were photographed. Scope In: 9:15:20 AM Scope Out: 9:32:26 AM Scope Withdrawal Time: 0 hours 12 minutes 40 seconds  Total Procedure Duration: 0 hours 17 minutes 6 seconds  Findings:                 The digital rectal exam was normal.                           Three sessile polyps were found in the transverse                            colon. The polyps were 3 to 5 mm in size. These                            polyps were removed with a cold snare. Resection  and retrieval were complete.                           Internal hemorrhoids were found during                            retroflexion. The hemorrhoids were small.                           The exam was otherwise without abnormality. Complications:            No immediate complications. Estimated Blood Loss:     Estimated blood loss was minimal. Impression:               - Three 3 to 5 mm polyps in the transverse colon,                            removed with a cold snare. Resected and retrieved.                           - Small internal hemorrhoids.                           - The examination was otherwise normal. Recommendation:           - Patient has a contact number available for                            emergencies. The signs and symptoms of potential                            delayed complications were discussed with the                             patient. Return to normal activities tomorrow.                            Written discharge instructions were provided to the                            patient.                           - Resume previous diet.                           - Continue present medications.                           - Await pathology results.                           - Repeat colonoscopy is recommended for                            surveillance. The colonoscopy date will be  determined after pathology results from today's                            exam become available for review. Jerene Bears, MD 01/18/2021 9:35:57 AM This report has been signed electronically.

## 2021-01-18 NOTE — Progress Notes (Signed)
Called to room to assist during endoscopic procedure.  Patient ID and intended procedure confirmed with present staff. Received instructions for my participation in the procedure from the performing physician.  

## 2021-01-18 NOTE — Progress Notes (Signed)
Sedate, gd SR, tolerated procedure well, VSS, report to RN 

## 2021-01-18 NOTE — Patient Instructions (Signed)
YOU HAD AN ENDOSCOPIC PROCEDURE TODAY AT [facility]:   Refer to the procedure report that was given to you for any specific questions about what was found during the examination.  If the procedure report does not answer your questions, please call your gastroenterologist to clarify.  If you requested that your care partner not be given the details of your procedure findings, then the procedure report has been included in a sealed envelope for you to review at your convenience later.  **Handouts given on polyps and hemorrhoids**  YOU SHOULD EXPECT: Some feelings of bloating in the abdomen. Passage of more gas than usual.  Walking can help get rid of the air that was put into your GI tract during the procedure and reduce the bloating. If you had a lower endoscopy (such as a colonoscopy or flexible sigmoidoscopy) you may notice spotting of blood in your stool or on the toilet paper. If you underwent a bowel prep for your procedure, you may not have a normal bowel movement for a few days.  Please Note:  You might notice some irritation and congestion in your nose or some drainage.  This is from the oxygen used during your procedure.  There is no need for concern and it should clear up in a day or so.  SYMPTOMS TO REPORT IMMEDIATELY:  Following lower endoscopy (colonoscopy or flexible sigmoidoscopy):  Excessive amounts of blood in the stool  Significant tenderness or worsening of abdominal pains  Swelling of the abdomen that is new, acute  Fever of 100F or higher  For urgent or emergent issues, a gastroenterologist can be reached at any hour by calling [phone]. Do not use MyChart messaging for urgent concerns.    DIET:  We do recommend a small meal at first, but then you may proceed to your regular diet.  Drink plenty of fluids but you should avoid alcoholic beverages for 24 hours.  ACTIVITY:  You should plan to take it easy for the rest of today and you should NOT DRIVE or  use heavy machinery until tomorrow (because of the sedation medicines used during the test).    FOLLOW UP: Our staff will call the number listed on your records 48-72 hours following your procedure to check on you and address any questions or concerns that you may have regarding the information given to you following your procedure. If we do not reach you, we will leave a message.  We will attempt to reach you two times.  During this call, we will ask if you have developed any symptoms of COVID 19. If you develop any symptoms (ie: fever, flu-like symptoms, shortness of breath, cough etc.) before then, please call [phone].  If you test positive for Covid 19 in the 2 weeks post procedure, please call and report this information to [place].    If any biopsies were taken you will be contacted by phone or by letter within the next 1-3 weeks.  Please call us at [phone] if you have not heard about the biopsies in 3 weeks.    SIGNATURES/CONFIDENTIALITY: You and/or your care partner have signed paperwork which will be entered into your electronic medical record.  These signatures attest to the fact that that the information above on your After Visit Summary has been reviewed and is understood.  Full responsibility of the confidentiality of this discharge information lies with you and/or your care-partner.

## 2021-01-18 NOTE — Progress Notes (Signed)
GASTROENTEROLOGY PROCEDURE H&P NOTE   Primary Care Physician: Midge Minium, MD    Reason for Procedure:   Hx of colon polyps   Plan:    Surveillance colonoscopy  Patient is appropriate for endoscopic procedure(s) in the ambulatory ([place]) setting.  The nature of the procedure, as well as the risks, benefits, and alternatives were carefully and thoroughly reviewed with the patient. Ample time for discussion and questions allowed. The patient understood, was satisfied, and agreed to proceed.     HPI: Timothy Burnett is a 55 y.o. male who presents for surveillance colonoscopy.  No complaints today.  No cp, dyspnea, abd pain.  Med hx as below.  Past Medical History:  Diagnosis Date   Bipolar disorder ([place])    Depression    Hyperlipemia    Ulcer    stomach    Past Surgical History:  Procedure Laterality Date   feet surgery     bone spurs   FOOT SURGERY  2012   [place]   TONSILLECTOMY      Prior to Admission medications   Medication Sig Start Date End Date Taking? Authorizing Provider  B Complex Vitamins (B COMPLEX PO) Take by mouth.   Yes [provider]  fenofibrate 160 MG tablet Take 1 tablet (160 mg total) by mouth daily. 12/20/20  Yes Midge Minium, MD  lamoTRIgine (LAMICTAL) 150 MG tablet Take 1 tablet (150 mg total) by mouth 2 (two) times daily. 11/07/20  Yes Arfeen, Arlyce Harman, MD  Multiple Vitamin (MULTIVITAMIN) tablet Take 1 tablet by mouth daily.   Yes [provider]  OLANZapine (ZYPREXA) 15 MG tablet Take 1 tablet (15 mg total) by mouth at bedtime. 11/07/20  Yes Arfeen, Arlyce Harman, MD  Omega-3 Fatty Acids (FISH OIL PO) Take by mouth.   Yes [provider]  ondansetron (ZOFRAN) 4 MG tablet Take 1 tablet (4 mg total) by mouth as directed. 01/11/21  Yes Tarynn Garling, Lajuan Lines, MD  rosuvastatin (CRESTOR) 40 MG tablet Take 1 tablet (40 mg total) by mouth daily. 12/20/20  Yes Midge Minium, MD  sertraline (ZOLOFT) 100 MG tablet Take 1.5 tablets  (150 mg total) by mouth daily. 11/07/20  Yes Arfeen, Arlyce Harman, MD  traZODone (DESYREL) 50 MG tablet Take 0.5-1 tablets (25-50 mg total) by mouth at bedtime as needed for sleep. 11/07/20  Yes Arfeen, Arlyce Harman, MD  hydrOXYzine (ATARAX/VISTARIL) 25 MG tablet Take 1 tablet (25 mg total) by mouth at bedtime. Patient not taking: No sig reported 05/04/20   Arfeen, Arlyce Harman, MD  rOPINIRole (REQUIP) 0.5 MG tablet Take 1 tablet (0.5 mg total) by mouth at bedtime. Patient not taking: No sig reported 08/03/20 08/03/21  Kathlee Nations, MD    Current Outpatient Medications  Medication Sig Dispense Refill   B Complex Vitamins (B COMPLEX PO) Take by mouth.     fenofibrate 160 MG tablet Take 1 tablet (160 mg total) by mouth daily. 90 tablet 1   lamoTRIgine (LAMICTAL) 150 MG tablet Take 1 tablet (150 mg total) by mouth 2 (two) times daily. 180 tablet 0   Multiple Vitamin (MULTIVITAMIN) tablet Take 1 tablet by mouth daily.     OLANZapine (ZYPREXA) 15 MG tablet Take 1 tablet (15 mg total) by mouth at bedtime. 90 tablet 0   Omega-3 Fatty Acids (FISH OIL PO) Take by mouth.     ondansetron (ZOFRAN) 4 MG tablet Take 1 tablet (4 mg total) by mouth as directed. 2 tablet 0  rosuvastatin (CRESTOR) 40 MG tablet Take 1 tablet (40 mg total) by mouth daily. 90 tablet 1   sertraline (ZOLOFT) 100 MG tablet Take 1.5 tablets (150 mg total) by mouth daily. 135 tablet 0   traZODone (DESYREL) 50 MG tablet Take 0.5-1 tablets (25-50 mg total) by mouth at bedtime as needed for sleep. 30 tablet 0   hydrOXYzine (ATARAX/VISTARIL) 25 MG tablet Take 1 tablet (25 mg total) by mouth at bedtime. (Patient not taking: No sig reported) 90 tablet 0   rOPINIRole (REQUIP) 0.5 MG tablet Take 1 tablet (0.5 mg total) by mouth at bedtime. (Patient not taking: No sig reported) 90 tablet 0   Current Facility-Administered Medications  Medication Dose Route Frequency Provider Last Rate Last Admin   0.9 %  sodium chloride infusion  500 mL Intravenous Once Chyrl Elwell, Lajuan Lines, MD        Allergies as of 01/18/2021 - Review Complete 01/18/2021  Allergen Reaction Noted   Penicillins Rash 05/09/2011    Family History  Problem Relation Age of Onset   Hyperlipidemia Mother    Heart disease Mother    Hypertension Mother    Hyperlipidemia Father    Colon cancer Neg Hx    Colon polyps Neg Hx    Esophageal cancer Neg Hx    Rectal cancer Neg Hx    Stomach cancer Neg Hx     Social History   Socioeconomic History   Marital status: Married    Spouse name: Not on file   Number of children: Not on file   Years of education: Not on file   Highest education level: Not on file  Occupational History   Not on file  Tobacco Use   Smoking status: Never   Smokeless tobacco: Never  Vaping Use   Vaping Use: Never used  Substance and Sexual Activity   Alcohol use: No    Alcohol/week: 0.0 standard drinks    Comment: Occasional    Drug use: No   Sexual activity: Not Currently  Other Topics Concern   Not on file  Social History Narrative   Not on file   Social Determinants of Health   Financial Resource Strain: Not on file  Food Insecurity: Not on file  Transportation Needs: Not on file  Physical Activity: Not on file  Stress: Not on file  Social Connections: Not on file  Intimate Partner Violence: Not on file    Physical Exam: Vital signs in last 24 hours: @BP  (!) 136/59   Pulse 78   Temp 98.2 F (36.8 C) (Temporal)   Ht 5\' 8"  (1.727 m)   Wt 200 lb (90.7 kg)   SpO2 98%   BMI 30.41 kg/m  GEN: NAD EYE: Sclerae anicteric ENT: MMM CV: Non-tachycardic Pulm: CTA b/l GI: Soft, NT/ND NEURO:  Alert & Oriented x 3   Zenovia Jarred, MD [facility]  01/18/2021 8:58 AM

## 2021-01-18 NOTE — Progress Notes (Signed)
Pt's states no medical or surgical changes since previsit or office visit. 

## 2021-01-22 ENCOUNTER — Telehealth: Payer: Self-pay | Admitting: *Deleted

## 2021-01-22 ENCOUNTER — Encounter: Payer: Self-pay | Admitting: Internal Medicine

## 2021-01-22 NOTE — Telephone Encounter (Signed)
  Follow up Call-  Call back number 01/18/2021  Post procedure Call Back phone  # [phone]  Permission to leave phone message Yes  Some recent data might be hidden     Patient questions:  Do you have a fever, pain , or abdominal swelling? No. Pain Score  0 *  Have you tolerated food without any problems? Yes.    Have you been able to return to your normal activities? Yes.    Do you have any questions about your discharge instructions: Diet   No. Medications  No. Follow up visit  No.  Do you have questions or concerns about your Care? No.  Actions: * If pain score is 4 or above: No action needed, pain <4.  Have you developed a fever since your procedure? no  2.   Have you had an respiratory symptoms (SOB or cough) since your procedure? no  3.   Have you tested positive for COVID 19 since your procedure no  4.   Have you had any family members/close contacts diagnosed with the COVID 19 since your procedure?  no   If yes to any of these questions please route to Joylene John, RN and Joella Prince, RN

## 2021-02-07 ENCOUNTER — Telehealth (HOSPITAL_BASED_OUTPATIENT_CLINIC_OR_DEPARTMENT_OTHER): Payer: BC Managed Care – PPO | Admitting: Psychiatry

## 2021-02-07 ENCOUNTER — Encounter (HOSPITAL_COMMUNITY): Payer: Self-pay | Admitting: Psychiatry

## 2021-02-07 ENCOUNTER — Other Ambulatory Visit: Payer: Self-pay

## 2021-02-07 VITALS — Wt 192.0 lb

## 2021-02-07 DIAGNOSIS — F319 Bipolar disorder, unspecified: Secondary | ICD-10-CM

## 2021-02-07 DIAGNOSIS — G2581 Restless legs syndrome: Secondary | ICD-10-CM

## 2021-02-07 DIAGNOSIS — F419 Anxiety disorder, unspecified: Secondary | ICD-10-CM

## 2021-02-07 MED ORDER — SERTRALINE HCL 100 MG PO TABS: 150.0000 mg | ORAL_TABLET | Freq: Every day | ORAL | 0 refills | Status: DC

## 2021-02-07 MED ORDER — LAMOTRIGINE 150 MG PO TABS
150.0000 mg | ORAL_TABLET | Freq: Two times a day (BID) | ORAL | 0 refills | Status: DC
Start: 2021-02-07 — End: 2021-04-16

## 2021-02-07 MED ORDER — CLONAZEPAM 0.5 MG PO TABS: 0.5000 mg | ORAL_TABLET | Freq: Every evening | ORAL | 0 refills | Status: DC | PRN

## 2021-02-07 MED ORDER — OLANZAPINE 15 MG PO TABS: 15.0000 mg | ORAL_TABLET | Freq: Every day | ORAL | 0 refills | Status: DC

## 2021-02-07 NOTE — Progress Notes (Signed)
Virtual Visit via Video Note  I connected with Timothy Burnett on 02/07/21 at  8:20 AM EST by a video enabled telemedicine application and verified that I am speaking with the correct person using two identifiers.  Location: Patient: Home Provider: Home Office   I discussed the limitations of evaluation and management by telemedicine and the availability of in person appointments. The patient expressed understanding and agreed to proceed.  History of Present Illness: Patient is evaluated by video session.  He is under a lot of stress because not able to find the job so far.  He had sent his resume in multiple places but so far he has no luck.  Patient believes he is overqualified and he did not get a response or he has to take a lower salary.  He admitted poor sleep, anxiety, nervousness, racing thoughts.  Sometimes he not eating and his weight fluctuates but he tried to lose weight and walking every day.  Patient reported in the night he talked to himself and his wife noticed that he is very restless at night.  He worried about his finances as he has just another week of unemployment.  Patient reported his wife is very supportive and helpful.  Now holidays are coming and he has to travel to [place] and to visit his parents and her his wife's family.  He admitted sometimes irritability but denies any mania, hallucination, suicidal thoughts, anger or any impulsive behavior.  So far he feels medicine keeping his mood stable other than he is very nervous and anxious.  In the past he had taken the olanzapine 20 mg but we cut down due to high triglycerides.  He is also taking Crestor and now he had appointment to see his PCP in January.  Patient denies drinking or using any illegal substances.  On the last visit we tried trazodone to help his sleep but that did not help.  In the past he also had tried Requip but it made him groggy.  Past Psychiatric History:  H/O mania and impulsive behavior.  No  h/o psychiatric inpatient treatment or suicidal attempt.  Tried Abilify and Geodon with poor outcome. Vistaril made groggy.   Psychiatric Specialty Exam: Physical Exam  Review of Systems  Weight 192 lb (87.1 kg).There is no height or weight on file to calculate BMI.  General Appearance: Fairly Groomed  Eye Contact:  Good  Speech:  Slow  Volume:  Decreased  Mood:  Anxious and Dysphoric  Affect:  Constricted  Thought Process:  Descriptions of Associations: Intact  Orientation:  Full (Time, Place, and Person)  Thought Content:  Rumination  Suicidal Thoughts:  No  Homicidal Thoughts:  No  Memory:  Immediate;   Good Recent;   Good Remote;   Good  Judgement:  Good  Insight:  Present  Psychomotor Activity:  Decreased  Concentration:  Concentration: Good and Attention Span: Good  Recall:  Good  Fund of Knowledge:  Good  Language:  Good  Akathisia:  No  Handed:  Right  AIMS (if indicated):     Assets:  Communication Skills Desire for [place] Talents/Skills Transportation  ADL's:  Intact  Cognition:  WNL  Sleep:   poor      Assessment and Plan: Bipolar disorder type I.  Anxiety.  Restless leg.  Discussed current psychosocial stressors.  Patient under a lot of stress because of not able to find a job.  We talk about trying low-dose Klonopin to help  his anxiety, restless leg and sleep for the time being until he can find a job.  He has interviews coming up and is hopeful it will give some hope.  He wants to continue Zoloft 150 mg daily, Lamictal 300 mg daily and olanzapine 15 mg at bedtime.  We discussed coping skills, regular exercise, walking and breathing techniques to calm himself from anxiety.  We will start Klonopin 0.5 mg to take half to 1 tablet as needed to help his anxiety.  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  Patient like to have a follow-up in 4 weeks.  Discussed safety concerns and anytime having  active suicidal thoughts or homicidal halogen need to call 911 or go to local emergency room.  Follow Up Instructions:    I discussed the assessment and treatment plan with the patient. The patient was provided an opportunity to ask questions and all were answered. The patient agreed with the plan and demonstrated an understanding of the instructions.   The patient was advised to call back or seek an in-person evaluation if the symptoms worsen or if the condition fails to improve as anticipated.  I provided 25 minutes of non-face-to-face time during this encounter.   Kathlee Nations, MD

## 2021-03-07 ENCOUNTER — Telehealth (HOSPITAL_COMMUNITY): Payer: BC Managed Care – PPO | Admitting: Psychiatry

## 2021-03-26 ENCOUNTER — Other Ambulatory Visit: Payer: Self-pay | Admitting: Family Medicine

## 2021-03-26 ENCOUNTER — Telehealth (HOSPITAL_COMMUNITY): Payer: Self-pay | Admitting: *Deleted

## 2021-03-26 ENCOUNTER — Other Ambulatory Visit (HOSPITAL_COMMUNITY): Payer: Self-pay | Admitting: Psychiatry

## 2021-03-26 ENCOUNTER — Other Ambulatory Visit (HOSPITAL_COMMUNITY): Payer: Self-pay | Admitting: *Deleted

## 2021-03-26 DIAGNOSIS — F419 Anxiety disorder, unspecified: Secondary | ICD-10-CM

## 2021-03-26 DIAGNOSIS — E785 Hyperlipidemia, unspecified: Secondary | ICD-10-CM

## 2021-03-26 DIAGNOSIS — F319 Bipolar disorder, unspecified: Secondary | ICD-10-CM

## 2021-03-26 MED ORDER — LORAZEPAM 0.5 MG PO TABS
0.5000 mg | ORAL_TABLET | Freq: Every day | ORAL | 0 refills | Status: DC | PRN
Start: 2021-03-26 — End: 2021-04-16

## 2021-03-26 NOTE — Telephone Encounter (Signed)
Pt called requesting "something for anxiety" during the day. Pt has tried the Klonopin 0.5 mg at HS but said that it do anything. Wife has encouraged pt to call. Pt is working retail now and says he's very stressed and "can't take much more". Pt next appointment scheduled for 04/16/21. Please review and advise.

## 2021-03-26 NOTE — Telephone Encounter (Signed)
He can try Ativan 0.5 mg daily as needed. If agree than please call. Stop Klonopin.

## 2021-04-05 ENCOUNTER — Other Ambulatory Visit (HOSPITAL_COMMUNITY): Payer: Self-pay | Admitting: Psychiatry

## 2021-04-05 DIAGNOSIS — F319 Bipolar disorder, unspecified: Secondary | ICD-10-CM

## 2021-04-16 ENCOUNTER — Other Ambulatory Visit: Payer: Self-pay

## 2021-04-16 ENCOUNTER — Telehealth (HOSPITAL_COMMUNITY): Payer: BC Managed Care – PPO | Admitting: Psychiatry

## 2021-04-16 ENCOUNTER — Encounter (HOSPITAL_COMMUNITY): Payer: Self-pay | Admitting: Psychiatry

## 2021-04-16 VITALS — Wt 192.0 lb

## 2021-04-16 DIAGNOSIS — F319 Bipolar disorder, unspecified: Secondary | ICD-10-CM

## 2021-04-16 DIAGNOSIS — F419 Anxiety disorder, unspecified: Secondary | ICD-10-CM

## 2021-04-16 MED ORDER — OLANZAPINE 20 MG PO TABS: 20.0000 mg | ORAL_TABLET | Freq: Every day | ORAL | 1 refills | Status: DC

## 2021-04-16 MED ORDER — SERTRALINE HCL 100 MG PO TABS: 150.0000 mg | ORAL_TABLET | Freq: Every day | ORAL | 0 refills | Status: DC

## 2021-04-16 MED ORDER — LAMOTRIGINE 150 MG PO TABS: 150.0000 mg | ORAL_TABLET | Freq: Two times a day (BID) | ORAL | 0 refills | Status: DC

## 2021-04-16 MED ORDER — LORAZEPAM 0.5 MG PO TABS: 0.5000 mg | ORAL_TABLET | Freq: Every day | ORAL | 0 refills | Status: DC | PRN

## 2021-04-16 NOTE — Progress Notes (Signed)
Virtual Visit via Video Note  I connected with Timothy Burnett on 04/16/21 at  4:20 PM EST by a video enabled telemedicine application and verified that I am speaking with the correct person using two identifiers.  Location: Patient: Work Provider: Biomedical scientist   I discussed the limitations of evaluation and management by telemedicine and the availability of in person appointments. The patient expressed understanding and agreed to proceed.  History of Present Illness: Patient is evaluated by phone session.  He admitted under a lot of stress 2 months ago when he could not find a job and having a lot of irritability mood swings and anger.  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  Patient reported is a part-time but hoping to get full-time.  He reported things a somewhat better but is still have residual mood lability.  We have recommended trying Klonopin but that did not help and it was switched to Ativan.  Patient is still feeling some time it does not help and get very anxious and nervous and irritable at work.  He admitted to having issues with his wife and was getting very irritable but now things are better.  He admitted his wife wants him to work more so he does not get upset.  He is sleeping okay.  He denies any suicidal thoughts, homicidal thoughts, paranoia or any hallucination.  He is not taking trazodone, Requip, hydroxyzine as none of these medicines worked for him.  He changed his pillow and using nasal strips and he noticed since then his restless leg is better.  He is getting better sleep.  Denies drinking or using any illegal substances.  He is doing work out with his wife because she is trying to lose weight as she required knee replacement and he is hoping to keep his maintain his weight.  Patient denies tremors or shakes.  Past Psychiatric History:  H/O mania and impulsive behavior.  No h/o psychiatric inpatient treatment or suicidal attempt.  Tried Abilify and  Geodon with poor outcome. Vistaril made groggy.   Psychiatric Specialty Exam: Physical Exam  Review of Systems  Constitutional:        No rash  Psychiatric/Behavioral:  The patient is nervous/anxious.        Irritibility   Weight 192 lb (87.1 kg).There is no height or weight on file to calculate BMI.  General Appearance: Casual  Eye Contact:  Fair  Speech:  Normal Rate  Volume:  Normal  Mood:  Anxious  Affect:  Congruent  Thought Process:  Goal Directed  Orientation:  Full (Time, Place, and Person)  Thought Content:  Rumination  Suicidal Thoughts:  No  Homicidal Thoughts:  No  Memory:  Immediate;   Good Recent;   Good Remote;   Good  Judgement:  Intact  Insight:  Present  Psychomotor Activity:  Normal  Concentration:  Concentration: Good and Attention Span: Good  Recall:  Good  Fund of Knowledge:  Good  Language:  Good  Akathisia:  No  Handed:  Right  AIMS (if indicated):     Assets:  Communication Skills Desire for Improvement Housing Social Support Transportation  ADL's:  Intact  Cognition:  WNL  Sleep:   fair     Assessment and Plan: Bipolar disorder type I.  Anxiety.  Discuss residual mood lability.  In the past he has taken the olanzapine 20 mg but due to high triglyceride it was reduced to 15 mg.  He  is now taking Crestor.  I encouraged regular exercise, walking and he is okay to go back to olanzapine 20 mg however if triglycerides remain high then we may need to cut down back to olanzapine.  He is hoping once he gets settled in his new job we are able to reduce his dose of olanzapine.  I recommend to continue Ativan for severe anxiety for now.  Discontinue Klonopin, hydroxyzine and trazodone.  Continue Lamictal 300 mg daily and Zoloft 150 mg daily.  He has no rash, itching, tremors or shakes.  He is not interested in therapy.  Recommended to call us back if is any question of any concern.  Follow-up in 2 months.  Follow Up Instructions:    I discussed the  assessment and treatment plan with the patient. The patient was provided an opportunity to ask questions and all were answered. The patient agreed with the plan and demonstrated an understanding of the instructions.   The patient was advised to call back or seek an in-person evaluation if the symptoms worsen or if the condition fails to improve as anticipated.  I provided 25 minutes of non-face-to-face time during this encounter.   Kathlee Nations, MD

## 2021-06-17 ENCOUNTER — Encounter (HOSPITAL_COMMUNITY): Payer: Self-pay | Admitting: Psychiatry

## 2021-06-17 ENCOUNTER — Telehealth (HOSPITAL_BASED_OUTPATIENT_CLINIC_OR_DEPARTMENT_OTHER): Payer: 59 | Admitting: Psychiatry

## 2021-06-17 ENCOUNTER — Other Ambulatory Visit: Payer: Self-pay

## 2021-06-17 DIAGNOSIS — F319 Bipolar disorder, unspecified: Secondary | ICD-10-CM | POA: Diagnosis not present

## 2021-06-17 DIAGNOSIS — F419 Anxiety disorder, unspecified: Secondary | ICD-10-CM

## 2021-06-17 MED ORDER — OLANZAPINE 20 MG PO TABS: 20.0000 mg | ORAL_TABLET | Freq: Every day | ORAL | 0 refills | Status: DC

## 2021-06-17 MED ORDER — SERTRALINE HCL 100 MG PO TABS: 150.0000 mg | ORAL_TABLET | Freq: Every day | ORAL | 0 refills | Status: DC

## 2021-06-17 MED ORDER — LAMOTRIGINE 150 MG PO TABS: 150.0000 mg | ORAL_TABLET | Freq: Two times a day (BID) | ORAL | 0 refills | Status: DC

## 2021-06-17 NOTE — Progress Notes (Signed)
Virtual Visit via Video Note ? ?I connected with Timothy Burnett on 06/17/21 at  3:20 PM EDT by a video enabled telemedicine application and verified that I am speaking with the correct person using two identifiers. ? ?Location: ?Patient: Home  ?Provider: Home Office ?  ?I discussed the limitations of evaluation and management by telemedicine and the availability of in person appointments. The patient expressed understanding and agreed to proceed. ? ?History of Present Illness: ?Patient is evaluated by video session.  We increased the olanzapine on the last visit and he is doing much better.  He denies any irritability, anger, mania or any anger.  He admitted sleeping more than usual but he feels his mood is much stable.  He is getting along with his coworker without any issues.  Patient reported his stepdaughter is getting married and he is getting ready for that.  He is not taking hydroxyzine and Ativan since he feels his mood is much better.  Denies any hallucination, paranoia.  Denies drinking or using any illegal substances.  His weight remains unchanged from the past.  We have recommended given the history of high triglycerides may need to watch his calorie intake.  He is now on Crestor but also started watching his calorie intake.  He does walk a lot at his job.  He has no tremors or shakes.  He missed his appointment with his PCP but he is in the process of getting reschedule appointment.  Patient denies any panic attack. ? ?Past Psychiatric History:  ?H/O mania and impulsive behavior.  No h/o psychiatric inpatient treatment or suicidal attempt.  Tried Abilify and Geodon with poor outcome. Vistaril made groggy.  ? ?Psychiatric Specialty Exam: ?Physical Exam  ?Review of Systems  ?Weight 195 lb (88.5 kg).There is no height or weight on file to calculate BMI.  ?General Appearance: Casual  ?Eye Contact:  Good  ?Speech:  Clear and Coherent and Normal Rate  ?Volume:  Normal  ?Mood:  Euthymic  ?Affect:  Appropriate   ?Thought Process:  Goal Directed  ?Orientation:  Full (Time, Place, and Person)  ?Thought Content:  Logical  ?Suicidal Thoughts:  No  ?Homicidal Thoughts:  No  ?Memory:  Immediate;   Good ?Recent;   Good ?Remote;   Good  ?Judgement:  Good  ?Insight:  Present  ?Psychomotor Activity:  Normal  ?Concentration:  Concentration: Good and Attention Span: Good  ?Recall:  Good  ?Fund of Knowledge:  Good  ?Language:  Good  ?Akathisia:  No  ?Handed:  Right  ?AIMS (if indicated):     ?Assets:  Communication Skills ?Desire for Improvement ?Housing ?Resilience ?Social Support ?Talents/Skills ?Transportation  ?ADL's:  Intact  ?Cognition:  WNL  ?Sleep:   12 hrs  ? ? ? ? ?Assessment and Plan: ?Bipolar disorder type I.  Anxiety. ? ?Patient is back on olanzapine 20 mg which is helping his mood irritability and anger.  He has not taken Ativan since dose increase.  He is compliant with Lamictal and Zoloft.  He has no rash or any itching.  Continue Lamictal 300 mg daily, Zoloft 150 mg daily and olanzapine 20 mg at bedtime.  Recommend to have blood work and if triglycerides remains high then we may need to try a different medication.  Recommended to call us back if is any question or any concern.  Follow-up in 3 months. ? ?Follow Up Instructions: ? ?  ?I discussed the assessment and treatment plan with the patient. The patient was provided an opportunity to  ask questions and all were answered. The patient agreed with the plan and demonstrated an understanding of the instructions. ?  ?The patient was advised to call back or seek an in-person evaluation if the symptoms worsen or if the condition fails to improve as anticipated. ? ?I provided 17 minutes of non-face-to-face time during this encounter. ? ? ?Kathlee Nations, MD  ?

## 2021-06-24 ENCOUNTER — Other Ambulatory Visit: Payer: Self-pay | Admitting: Family Medicine

## 2021-06-24 DIAGNOSIS — E785 Hyperlipidemia, unspecified: Secondary | ICD-10-CM

## 2021-06-29 ENCOUNTER — Other Ambulatory Visit: Payer: Self-pay | Admitting: Family Medicine

## 2021-06-29 DIAGNOSIS — E785 Hyperlipidemia, unspecified: Secondary | ICD-10-CM

## 2021-07-01 ENCOUNTER — Telehealth: Payer: Self-pay | Admitting: Family Medicine

## 2021-07-01 ENCOUNTER — Other Ambulatory Visit: Payer: Self-pay

## 2021-07-01 DIAGNOSIS — E785 Hyperlipidemia, unspecified: Secondary | ICD-10-CM

## 2021-07-01 MED ORDER — ROSUVASTATIN CALCIUM 40 MG PO TABS: 40.0000 mg | ORAL_TABLET | Freq: Every day | ORAL | 0 refills | Status: DC

## 2021-07-01 MED ORDER — ROSUVASTATIN CALCIUM 40 MG PO TABS: 40.0000 mg | ORAL_TABLET | Freq: Every day | ORAL | 1 refills | Status: DC

## 2021-07-01 MED ORDER — FENOFIBRATE 160 MG PO TABS: 160.0000 mg | ORAL_TABLET | Freq: Every day | ORAL | 0 refills | Status: DC

## 2021-07-01 MED ORDER — FENOFIBRATE 160 MG PO TABS: 160.0000 mg | ORAL_TABLET | Freq: Every day | ORAL | 1 refills | Status: DC

## 2021-07-01 NOTE — Telephone Encounter (Signed)
Pt called in asking for a refill on the fenofibrate and the crestor pt uses [facility] on [address]  ? ?Next appt 07/29/21 ?

## 2021-07-01 NOTE — Telephone Encounter (Signed)
Rx sent 

## 2021-07-11 ENCOUNTER — Encounter: Payer: Self-pay | Admitting: Registered Nurse

## 2021-07-15 ENCOUNTER — Encounter: Payer: Self-pay | Admitting: *Deleted

## 2021-07-15 NOTE — Research (Unsigned)
Message left for Timothy Burnett to see if its ok to email him a consent to review. Timothy leeland lovelady my via Deloris Ping stating he was interested in [employer]. Ok from [place], NP to contact pt.  ?

## 2021-07-29 ENCOUNTER — Encounter: Payer: Self-pay | Admitting: Family Medicine

## 2021-08-21 ENCOUNTER — Encounter: Payer: Self-pay | Admitting: Family Medicine

## 2021-08-21 ENCOUNTER — Ambulatory Visit (INDEPENDENT_AMBULATORY_CARE_PROVIDER_SITE_OTHER): Payer: Managed Care, Other (non HMO) | Admitting: Family Medicine

## 2021-08-21 VITALS — BP 130/76 | HR 69 | Temp 97.5°F | Resp 17 | Ht 67.0 in | Wt 199.8 lb

## 2021-08-21 DIAGNOSIS — Z125 Encounter for screening for malignant neoplasm of prostate: Secondary | ICD-10-CM

## 2021-08-21 DIAGNOSIS — Z0000 Encounter for general adult medical examination without abnormal findings: Secondary | ICD-10-CM

## 2021-08-21 DIAGNOSIS — E669 Obesity, unspecified: Secondary | ICD-10-CM

## 2021-08-21 DIAGNOSIS — F319 Bipolar disorder, unspecified: Secondary | ICD-10-CM

## 2021-08-21 LAB — HEPATIC FUNCTION PANEL
ALT: 28 U/L (ref 0–53)
AST: 19 U/L (ref 0–37)
Albumin: 4.4 g/dL (ref 3.5–5.2)
Alkaline Phosphatase: 63 U/L (ref 39–117)
Bilirubin, Direct: 0.1 mg/dL (ref 0.0–0.3)
Total Bilirubin: 0.3 mg/dL (ref 0.2–1.2)
Total Protein: 6.5 g/dL (ref 6.0–8.3)

## 2021-08-21 LAB — CBC WITH DIFFERENTIAL/PLATELET
Basophils Absolute: 0 10*3/uL (ref 0.0–0.1)
Basophils Relative: 0.7 % (ref 0.0–3.0)
Eosinophils Absolute: 0.1 10*3/uL (ref 0.0–0.7)
Eosinophils Relative: 2.3 % (ref 0.0–5.0)
HCT: 37.7 % — ABNORMAL LOW (ref 39.0–52.0)
Hemoglobin: 12.5 g/dL — ABNORMAL LOW (ref 13.0–17.0)
Lymphocytes Relative: 28.3 % (ref 12.0–46.0)
Lymphs Abs: 1.8 10*3/uL (ref 0.7–4.0)
MCHC: 33.1 g/dL (ref 30.0–36.0)
MCV: 93.1 fl (ref 78.0–100.0)
Monocytes Absolute: 0.5 10*3/uL (ref 0.1–1.0)
Monocytes Relative: 8.3 % (ref 3.0–12.0)
Neutro Abs: 3.8 10*3/uL (ref 1.4–7.7)
Neutrophils Relative %: 60.4 % (ref 43.0–77.0)
Platelets: 227 10*3/uL (ref 150.0–400.0)
RBC: 4.05 Mil/uL — ABNORMAL LOW (ref 4.22–5.81)
RDW: 15 % (ref 11.5–15.5)
WBC: 6.3 10*3/uL (ref 4.0–10.5)

## 2021-08-21 LAB — LIPID PANEL
Cholesterol: 189 mg/dL (ref 0–200)
HDL: 16.8 mg/dL — ABNORMAL LOW (ref >39.00)
Total CHOL/HDL Ratio: 11
Triglycerides: 403 mg/dL — ABNORMAL HIGH (ref 0.0–149.0)

## 2021-08-21 LAB — LDL CHOLESTEROL, DIRECT: Direct LDL: 127 mg/dL

## 2021-08-21 LAB — BASIC METABOLIC PANEL
BUN: 19 mg/dL (ref 6–23)
CO2: 26 mEq/L (ref 19–32)
Calcium: 9.5 mg/dL (ref 8.4–10.5)
Chloride: 106 mEq/L (ref 96–112)
Creatinine, Ser: 1.19 mg/dL (ref 0.40–1.50)
GFR: 68.21 mL/min (ref >60.00)
Glucose, Bld: 87 mg/dL (ref 70–99)
Potassium: 4.2 mEq/L (ref 3.5–5.1)
Sodium: 141 mEq/L (ref 135–145)

## 2021-08-21 LAB — PSA: PSA: 0.54 ng/mL (ref 0.10–4.00)

## 2021-08-21 LAB — TSH: TSH: 3.46 u[IU]/mL (ref 0.35–5.50)

## 2021-08-21 NOTE — Patient Instructions (Signed)
Follow up in 1 year or as needed We'll notify you of your lab results and make any changes if needed Continue to work on healthy diet and regular exercise- you can do it!! Call with any questions or concerns Stay Safe!  Stay Healthy! Have a great summer!!!

## 2021-08-21 NOTE — Progress Notes (Signed)
   Subjective:    Patient ID: Timothy Burnett, male    DOB: 1966/02/06, 56 y.o.   MRN: [id number]  HPI CPE- UTD on Tdap, colonoscopy  Patient Care Team    Relationship Specialty Notifications Start End  Midge Minium, MD PCP - General Family Medicine  06/11/12   Kathlee Nations, MD Consulting Physician Psychiatry  01/03/15   Jerene Bears, MD Consulting Physician Gastroenterology  06/20/20     Health Maintenance  Topic Date Due   Zoster Vaccines- Shingrix (1 of 2) 11/21/2021 (Originally 12/21/2014)   INFLUENZA VACCINE  10/29/2021   TETANUS/TDAP  01/03/2023   COLONOSCOPY (Pts 45-44yr Insurance coverage will need to be confirmed)  01/18/2026   HPV VACCINES  Aged Out   COVID-19 Vaccine  Discontinued   Hepatitis C Screening  Discontinued   HIV Screening  Discontinued      Review of Systems Patient reports no vision/hearing changes, anorexia, fever ,adenopathy, persistant/recurrent hoarseness, swallowing issues, chest pain, palpitations, edema, persistant/recurrent cough, hemoptysis, dyspnea (rest,exertional, paroxysmal nocturnal), gastrointestinal  bleeding (melena, rectal bleeding), abdominal pain, excessive heart burn, GU symptoms (dysuria, hematuria, voiding/incontinence issues) syncope, focal weakness, memory loss, numbness & tingling, skin/hair/nail changes, depression, anxiety, abnormal bruising/bleeding, musculoskeletal symptoms/signs.     Objective:   Physical Exam General Appearance:    Alert, cooperative, no distress, appears stated age  Head:    Normocephalic, without obvious abnormality, atraumatic  Eyes:    PERRL, conjunctiva/corneas clear, EOM's intact both eyes       Ears:    Normal TM's and external ear canals, both ears  Nose:   Nares normal, septum midline, mucosa normal, no drainage   or sinus tenderness  Throat:   Lips, mucosa, and tongue normal; teeth and gums normal  Neck:   Supple, symmetrical, trachea midline, no adenopathy;       thyroid:  No  enlargement/tenderness/nodules  Back:     Symmetric, no curvature, ROM normal, no CVA tenderness  Lungs:     Clear to auscultation bilaterally, respirations unlabored  Chest wall:    No tenderness or deformity  Heart:    Regular rate and rhythm, S1 and S2 normal, no murmur, rub   or gallop  Abdomen:     Soft, non-tender, bowel sounds active all four quadrants,    no masses, no organomegaly  Genitalia:    Deferred  Rectal:    Extremities:   Extremities normal, atraumatic, no cyanosis or edema  Pulses:   2+ and symmetric all extremities  Skin:   Skin color, texture, turgor normal, no rashes or lesions  Lymph nodes:   Cervical, supraclavicular, and axillary nodes normal  Neurologic:   CNII-XII intact. Normal strength, sensation and reflexes      throughout          Assessment & Plan:

## 2021-08-21 NOTE — Assessment & Plan Note (Signed)
Following w/ psych.  Doing well

## 2021-08-21 NOTE — Assessment & Plan Note (Signed)
Pt has gained 4 lbs since last visit.  Encouraged healthy diet and regular exercise.  Check labs to risk stratify.  Will follow.

## 2021-08-21 NOTE — Assessment & Plan Note (Signed)
Pt's PE WNL w/ exception of BMI.  UTD on Tdap, colonoscopy.  Check labs.  Anticipatory guidance provided.

## 2021-08-22 ENCOUNTER — Telehealth: Payer: Self-pay

## 2021-08-22 NOTE — Telephone Encounter (Signed)
Spoke w/ pt and advised of lab results  

## 2021-08-22 NOTE — Telephone Encounter (Signed)
-----   Message from Midge Minium, MD sent at 08/22/2021  7:19 AM EDT ----- Your triglycerides are again too high.  Please make sure you are taking your Crestor and Fenofibrate daily while working on healthy diet and regular exercise.  Remainder of labs are stable and look good

## 2021-09-06 ENCOUNTER — Encounter: Payer: Self-pay | Admitting: *Deleted

## 2021-09-06 DIAGNOSIS — Z006 Encounter for examination for normal comparison and control in clinical research program: Secondary | ICD-10-CM

## 2021-09-06 NOTE — Research (Signed)
Message left for Mr Finlay to call back about [employer] .

## 2021-09-11 ENCOUNTER — Other Ambulatory Visit (HOSPITAL_COMMUNITY): Payer: Self-pay | Admitting: Psychiatry

## 2021-09-11 DIAGNOSIS — F319 Bipolar disorder, unspecified: Secondary | ICD-10-CM

## 2021-09-13 ENCOUNTER — Other Ambulatory Visit (HOSPITAL_COMMUNITY): Payer: Self-pay | Admitting: *Deleted

## 2021-09-13 DIAGNOSIS — F319 Bipolar disorder, unspecified: Secondary | ICD-10-CM

## 2021-09-13 MED ORDER — OLANZAPINE 20 MG PO TABS: 20.0000 mg | ORAL_TABLET | Freq: Every day | ORAL | 0 refills | Status: DC

## 2021-09-17 ENCOUNTER — Telehealth (HOSPITAL_COMMUNITY): Payer: 59 | Admitting: Psychiatry

## 2021-09-17 ENCOUNTER — Encounter (HOSPITAL_COMMUNITY): Payer: Self-pay | Admitting: Psychiatry

## 2021-09-17 DIAGNOSIS — F419 Anxiety disorder, unspecified: Secondary | ICD-10-CM

## 2021-09-17 DIAGNOSIS — F319 Bipolar disorder, unspecified: Secondary | ICD-10-CM | POA: Diagnosis not present

## 2021-09-17 MED ORDER — LAMOTRIGINE 150 MG PO TABS: 150.0000 mg | ORAL_TABLET | Freq: Two times a day (BID) | ORAL | 0 refills | Status: DC

## 2021-09-17 MED ORDER — OLANZAPINE 15 MG PO TABS: 15.0000 mg | ORAL_TABLET | Freq: Every day | ORAL | 0 refills | Status: DC

## 2021-09-17 MED ORDER — SERTRALINE HCL 100 MG PO TABS: 150.0000 mg | ORAL_TABLET | Freq: Every day | ORAL | 0 refills | Status: DC

## 2021-09-17 NOTE — Progress Notes (Signed)
Virtual Visit via Video Note  I connected with Timothy Burnett on 09/17/21 at  2:40 PM EDT by a video enabled telemedicine application and verified that I am speaking with the correct person using two identifiers.  Location: Patient: Home Provider: Home Office   I discussed the limitations of evaluation and management by telemedicine and the availability of in person appointments. The patient expressed understanding and agreed to proceed.  History of Present Illness: Patient is evaluated by video session.  He is taking his medication as prescribed.  He feels his mood is stable but there are some times when he feels frustrated but did not provide details other than job is stressful.  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  He works for [employer] and so far job is challenging but manageable.  He has not taken hydroxyzine and Ativan for more than 6 months.  He admitted few pounds weight gain.  Recently had a blood work and his lipid panel shows high triglycerides and LDL.  His basic chemistry is normal.  He denies any tremors, shakes or any EPS.  He admitted does not do exercise but walk a lot at job.  Is going to transitioning in his new job in October.  He lives with his wife who is very supportive.  His wife is having knee replacement in upcoming months.  Patient denies any mania, psychosis, hallucination.  He denies any panic attack.  Past Psychiatric History:  H/O mania and impulsive behavior.  No h/o psychiatric inpatient treatment or suicidal attempt.  Tried Abilify and Geodon with poor outcome. Vistaril made groggy.   Recent Results (from the past 2160 hour(s))  Lipid panel     Status: Abnormal   Collection Time: 08/21/21  8:54 AM  Result Value Ref Range   Cholesterol 189 0 - 200 mg/dL    Comment: ATP III Classification       Desirable:  < 200 mg/dL               Borderline High:  200 - 239 mg/dL          High:  > = 240 mg/dL   Triglycerides  (H) 0.0 - 149.0 mg/dL    403.0 Triglyceride is over 400; calculations on Lipids are invalid.    Comment: Normal:  <150 mg/dLBorderline High:  150 - 199 mg/dL   HDL 16.80 (L) >39.00 mg/dL   Total CHOL/HDL Ratio 11     Comment:                Men          Women1/2 Average Risk     3.4          3.3Average Risk          5.0          4.42X Average Risk          9.6          7.13X Average Risk          15.0          11.0                      Basic metabolic panel     Status: None   Collection Time: 08/21/21  8:54 AM  Result Value Ref Range   Sodium 141 135 - 145 mEq/L   Potassium 4.2 3.5 - 5.1 mEq/L  Chloride 106 96 - 112 mEq/L   CO2 26 19 - 32 mEq/L   Glucose, Bld 87 70 - 99 mg/dL   BUN 19 6 - 23 mg/dL   Creatinine, Ser 1.19 0.40 - 1.50 mg/dL   GFR 68.21 >60.00 mL/min    Comment: Calculated using the CKD-EPI Creatinine Equation (2021)   Calcium 9.5 8.4 - 10.5 mg/dL  TSH     Status: None   Collection Time: 08/21/21  8:54 AM  Result Value Ref Range   TSH 3.46 0.35 - 5.50 uIU/mL  Hepatic function panel     Status: None   Collection Time: 08/21/21  8:54 AM  Result Value Ref Range   Total Bilirubin 0.3 0.2 - 1.2 mg/dL   Bilirubin, Direct 0.1 0.0 - 0.3 mg/dL   Alkaline Phosphatase 63 39 - 117 U/L   AST 19 0 - 37 U/L   ALT 28 0 - 53 U/L   Total Protein 6.5 6.0 - 8.3 g/dL   Albumin 4.4 3.5 - 5.2 g/dL  CBC with Differential/Platelet     Status: Abnormal   Collection Time: 08/21/21  8:54 AM  Result Value Ref Range   WBC 6.3 4.0 - 10.5 K/uL   RBC 4.05 (L) 4.22 - 5.81 Mil/uL   Hemoglobin 12.5 (L) 13.0 - 17.0 g/dL   HCT 37.7 (L) 39.0 - 52.0 %   MCV 93.1 78.0 - 100.0 fl   MCHC 33.1 30.0 - 36.0 g/dL   RDW 15.0 11.5 - 15.5 %   Platelets 227.0 150.0 - 400.0 K/uL   Neutrophils Relative % 60.4 43.0 - 77.0 %   Lymphocytes Relative 28.3 12.0 - 46.0 %   Monocytes Relative 8.3 3.0 - 12.0 %   Eosinophils Relative 2.3 0.0 - 5.0 %   Basophils Relative 0.7 0.0 - 3.0 %   Neutro Abs 3.8 1.4 - 7.7  K/uL   Lymphs Abs 1.8 0.7 - 4.0 K/uL   Monocytes Absolute 0.5 0.1 - 1.0 K/uL   Eosinophils Absolute 0.1 0.0 - 0.7 K/uL   Basophils Absolute 0.0 0.0 - 0.1 K/uL  PSA     Status: None   Collection Time: 08/21/21  8:54 AM  Result Value Ref Range   PSA 0.54 0.10 - 4.00 ng/mL    Comment: Test performed using Access Hybritech PSA Assay, a parmagnetic partical, chemiluminecent immunoassay.  LDL cholesterol, direct     Status: None   Collection Time: 08/21/21  8:54 AM  Result Value Ref Range   Direct LDL 127.0 mg/dL    Comment: Optimal:  <100 mg/dLNear or Above Optimal:  100-129 mg/dLBorderline High:  130-159 mg/dLHigh:  160-189 mg/dLVery High:  >190 mg/dL     Psychiatric Specialty Exam: Physical Exam  Review of Systems  Weight 199 lb (90.3 kg).There is no height or weight on file to calculate BMI.  General Appearance: Casual  Eye Contact:  Good  Speech:  Clear and Coherent and Normal Rate  Volume:  Normal  Mood:  Anxious  Affect:  Appropriate  Thought Process:  Goal Directed  Orientation:  Full (Time, Place, and Person)  Thought Content:  Logical and Rumination  Suicidal Thoughts:  No  Homicidal Thoughts:  No  Memory:  Immediate;   Good Recent;   Good Remote;   Good  Judgement:  Intact  Insight:  Good  Psychomotor Activity:  Normal  Concentration:  Concentration: Good and Attention Span: Good  Recall:  Good  Fund of Knowledge:  Good  Language:  Good  Akathisia:  No  Handed:  Right  AIMS (if indicated):     Assets:  Housing Social Support Talents/Skills Transportation  ADL's:  Intact  Cognition:  WNL  Sleep:         Assessment and Plan: Bipolar disorder type I.  Anxiety.  I reviewed blood work results.  His LDL and triglycerides are high.  He has been doing better and I discussed we should try reducing his olanzapine back to 15 mg.  This may help his reducing weight and reducing triglyceride.  He is not taking hydroxyzine and trazodone.  Otherwise he is doing okay.   He is willing to try and he promised to call us back if he noticed any change in his mood.  We will continue Zoloft 150 mg daily, Lamictal 300 mg daily and we will try olanzapine back to 15 mg at bedtime.  He has no rash, itching tremors or shakes.  Follow-up in 3 months.  Follow Up Instructions:    I discussed the assessment and treatment plan with the patient. The patient was provided an opportunity to ask questions and all were answered. The patient agreed with the plan and demonstrated an understanding of the instructions.   The patient was advised to call back or seek an in-person evaluation if the symptoms worsen or if the condition fails to improve as anticipated.  Collaboration of Care: Primary Care Provider AEB notes are available in epic to review.  Patient/Guardian was advised Release of Information must be obtained prior to any record release in order to collaborate their care with an outside provider. Patient/Guardian was advised if they have not already done so to contact the registration department to sign all necessary forms in order for [place] to release information regarding their care.   Consent: Patient/Guardian gives verbal consent for treatment and assignment of benefits for services provided during this visit. Patient/Guardian expressed understanding and agreed to proceed.    I provided 23 minutes of non-face-to-face time during this encounter.   Kathlee Nations, MD

## 2021-09-19 ENCOUNTER — Encounter: Payer: Self-pay | Admitting: Family Medicine

## 2021-09-19 DIAGNOSIS — E785 Hyperlipidemia, unspecified: Secondary | ICD-10-CM

## 2021-09-25 ENCOUNTER — Other Ambulatory Visit (HOSPITAL_COMMUNITY): Payer: Self-pay | Admitting: Psychiatry

## 2021-09-25 DIAGNOSIS — F319 Bipolar disorder, unspecified: Secondary | ICD-10-CM

## 2021-10-18 ENCOUNTER — Encounter: Payer: Self-pay | Admitting: Family Medicine

## 2021-10-18 DIAGNOSIS — E785 Hyperlipidemia, unspecified: Secondary | ICD-10-CM

## 2021-10-22 ENCOUNTER — Telehealth (HOSPITAL_COMMUNITY): Payer: Self-pay | Admitting: *Deleted

## 2021-10-22 NOTE — Telephone Encounter (Signed)
He need to contact his PCP for Chantix.  He need to closely monitor his symptoms if he started Chantix because Chantix can cause mood swing, irritability, suicidal thoughts.

## 2021-10-22 NOTE — Telephone Encounter (Signed)
Will contact pt.

## 2021-10-22 NOTE — Telephone Encounter (Signed)
Pt called stating that he has started a new job with new insurance and they are requiring him to start smoking cessation medication. Pt mentioned Chantix specifically. Pt next appointment scheduled for 12/09/21. Please review and advise.

## 2021-10-30 ENCOUNTER — Telehealth (HOSPITAL_COMMUNITY): Payer: Self-pay | Admitting: *Deleted

## 2021-10-30 ENCOUNTER — Encounter (INDEPENDENT_AMBULATORY_CARE_PROVIDER_SITE_OTHER): Payer: BC Managed Care – PPO | Admitting: Family Medicine

## 2021-10-30 DIAGNOSIS — Z720 Tobacco use: Secondary | ICD-10-CM

## 2021-10-30 NOTE — Telephone Encounter (Signed)
He is taking Zoloft 150 mg.  We can try Wellbutrin but he cannot take both medicine together.  If I agree we can try Wellbutrin 150 mg in the morning and discontinue Zoloft.  If he noticed irritability, anger, mania that he need to call us back immediately.

## 2021-10-30 NOTE — Telephone Encounter (Signed)
His smoking cessation should be addressed by primary care physician.  I am happy to talk to his PCP if needed.  Chantix have side effects which ranges from mood changes to psychosis.  If he started to have the side effects from Chantix then we will need to adjust his psychotropic medication.  However the best option is to stop the Chantix if he is having side effects.

## 2021-10-30 NOTE — Telephone Encounter (Signed)
I'm sorry. He is interested in Wellbutrin or anything else you might recommend for smoking cessation.

## 2021-10-30 NOTE — Telephone Encounter (Signed)
Writer spoke with pt to advise that Chantix, or any smoking cessation medications, will need to be prescribed by his PCP. Pt verbalizes understanding. Pt also advised to stop medication and contact this office immediately if he has increased lability, irritation, increased depression and/or suicidal thoughts. Pt agrees. Pt agrees to contact PCP again regarding Chantix and keep both offices informed of any issues.

## 2021-10-30 NOTE — Telephone Encounter (Signed)
Yes or could you recommend something else he asked. Writer advised nicotine patches/gum and he says he has unsuccessfully. If not he will talk to his PCP again he said. But does not like the s/e discussed regarding Chantix.

## 2021-10-30 NOTE — Telephone Encounter (Signed)
Pt has spoken with PCP regarding starting Chantix and was referred back to [place]. Pt is asking if there is anything we would recommend due to Chantix s/e. Pt insurance gives significant discount for smoking cessation and pt needs to send in medication information by 11/23/21. Pt next scheduled appointment is 12/09/21. Please review and advise.

## 2021-10-30 NOTE — Telephone Encounter (Signed)
This was discussed with pt so he asked about another medication, like Wellbutrin?

## 2021-10-30 NOTE — Telephone Encounter (Signed)
If he is not interested to take Wellbutrin then he should talk to his PCP for the management of smoking cessation.

## 2021-10-31 MED ORDER — VARENICLINE TARTRATE 0.5 MG X 11 & 1 MG X 42 PO TBPK: ORAL_TABLET | ORAL | 0 refills | Status: DC

## 2021-10-31 NOTE — Telephone Encounter (Signed)
[facility] VISIT   Patient agreed to [facility] visit and is aware that copayment and coinsurance may apply. Patient was treated using telemedicine according to accepted telemedicine protocols.  Subjective:   Patient complains of tobacco abuse.  He restarted smoking.  Asking for Chantix  Patient Active Problem List   Diagnosis Date Noted   Bipolar 1 disorder ([place]) 06/20/2020   Obesity (BMI 30-39.9) 06/20/2020   Weight gain 10/18/2019   Physical exam 04/03/2016   Hidradenitis suppurativa of right axilla 09/20/2015   Hyperlipidemia 06/11/2012   Social History   Tobacco Use   Smoking status: Never   Smokeless tobacco: Never  Substance Use Topics   Alcohol use: No    Alcohol/week: 0.0 standard drinks of alcohol    Comment: Occasional     Current Outpatient Medications:    varenicline (CHANTIX PAK) 0.5 MG X 11 & 1 MG X 42 tablet, Take one 0.5 mg tablet by mouth once daily for 3 days, then increase to one 0.5 mg tablet twice daily for 4 days, then increase to one 1 mg tablet twice daily., Disp: 53 tablet, Rfl: 0   B Complex Vitamins (B COMPLEX PO), Take by mouth., Disp: , Rfl:    fenofibrate 160 MG tablet, Take 1 tablet (160 mg total) by mouth daily., Disp: 30 tablet, Rfl: 0   hydrOXYzine (ATARAX/VISTARIL) 25 MG tablet, Take 1 tablet (25 mg total) by mouth at bedtime. (Patient not taking: Reported on 09/17/2021), Disp: 90 tablet, Rfl: 0   lamoTRIgine (LAMICTAL) 150 MG tablet, Take 1 tablet (150 mg total) by mouth 2 (two) times daily., Disp: 180 tablet, Rfl: 0   LORazepam (ATIVAN) 0.5 MG tablet, Take 1 tablet (0.5 mg total) by mouth daily as needed for anxiety. (Patient not taking: Reported on 09/17/2021), Disp: 20 tablet, Rfl: 0   Multiple Vitamin (MULTIVITAMIN) tablet, Take 1 tablet by mouth daily., Disp: , Rfl:    OLANZapine (ZYPREXA) 15 MG tablet, Take 1 tablet (15 mg total) by mouth at bedtime., Disp: 90 tablet, Rfl: 0   Omega-3 Fatty Acids (FISH OIL PO), Take by mouth., Disp: , Rfl:     rosuvastatin (CRESTOR) 40 MG tablet, Take 1 tablet (40 mg total) by mouth daily., Disp: 30 tablet, Rfl: 0   sertraline (ZOLOFT) 100 MG tablet, Take 1.5 tablets (150 mg total) by mouth daily., Disp: 135 tablet, Rfl: 0   traZODone (DESYREL) 50 MG tablet, Take 0.5-1 tablets (25-50 mg total) by mouth at bedtime as needed for sleep., Disp: 30 tablet, Rfl: 0  Allergies  Allergen Reactions   Penicillins Rash    Assessment and Plan:   Diagnosis: Tobacco Abuse. Please see myChart communication and orders below.   No orders of the defined types were placed in this encounter.  Meds ordered this encounter  Medications   varenicline (CHANTIX PAK) 0.5 MG X 11 & 1 MG X 42 tablet    Sig: Take one 0.5 mg tablet by mouth once daily for 3 days, then increase to one 0.5 mg tablet twice daily for 4 days, then increase to one 1 mg tablet twice daily.    Dispense:  53 tablet    Refill:  0    Annye Asa, MD 10/31/2021  A total of 7 minutes were spent by me to personally review the patient-generated inquiry, review patient records and data pertinent to assessment of the patient's problem, develop a management plan including generation of prescriptions and/or orders, and on subsequent communication with the patient through secure the MyChart  portal service.   There is no separately reported E/M service related to this service in the past 7 days nor does the patient have an upcoming soonest available appointment for this issue. This work was completed in less than 7 days.   The patient consented to this service today (see patient agreement prior to ongoing communication). Patient counseled regarding the need for in-person exam for certain conditions and was advised to call the office if any changing or worsening symptoms occur.   The codes to be used for the E/M service are: '[x]'$   99421 for 5-10 minutes of time spent on the inquiry. '[]'$   [id number] for 11-20 minutes. '[]'$   [id number] for 21+ minutes.

## 2021-12-03 ENCOUNTER — Encounter: Payer: Self-pay | Admitting: Family Medicine

## 2021-12-03 ENCOUNTER — Other Ambulatory Visit: Payer: Self-pay | Admitting: Family Medicine

## 2021-12-03 MED ORDER — VARENICLINE TARTRATE 0.5 MG X 11 & 1 MG X 42 PO TBPK: ORAL_TABLET | ORAL | 0 refills | Status: DC

## 2021-12-04 MED ORDER — VARENICLINE TARTRATE 1 MG PO TABS: 1.0000 mg | ORAL_TABLET | Freq: Two times a day (BID) | ORAL | 2 refills | Status: DC

## 2021-12-09 ENCOUNTER — Encounter (HOSPITAL_COMMUNITY): Payer: Self-pay | Admitting: Psychiatry

## 2021-12-09 ENCOUNTER — Telehealth (HOSPITAL_BASED_OUTPATIENT_CLINIC_OR_DEPARTMENT_OTHER): Payer: Self-pay | Admitting: Psychiatry

## 2021-12-09 DIAGNOSIS — F319 Bipolar disorder, unspecified: Secondary | ICD-10-CM

## 2021-12-09 DIAGNOSIS — F419 Anxiety disorder, unspecified: Secondary | ICD-10-CM

## 2021-12-09 MED ORDER — OLANZAPINE 15 MG PO TABS: 15.0000 mg | ORAL_TABLET | Freq: Every day | ORAL | 0 refills | Status: DC

## 2021-12-09 MED ORDER — LAMOTRIGINE 150 MG PO TABS: 150.0000 mg | ORAL_TABLET | Freq: Two times a day (BID) | ORAL | 0 refills | Status: DC

## 2021-12-09 MED ORDER — SERTRALINE HCL 100 MG PO TABS: 150.0000 mg | ORAL_TABLET | Freq: Every day | ORAL | 0 refills | Status: DC

## 2021-12-09 NOTE — Progress Notes (Signed)
Virtual Visit via Video Note  I connected with Timothy Burnett on 12/09/21 at  2:40 PM EDT by a video enabled telemedicine application and verified that I am speaking with the correct person using two identifiers.  Location: Patient: Work Provider: Biomedical scientist   I discussed the limitations of evaluation and management by telemedicine and the availability of in person appointments. The patient expressed understanding and agreed to proceed.  History of Present Illness: Patient is evaluated by video session.  We cut his olanzapine 15 mg from 30 mg. He cut down smoking and using Chantix and smoking 3 cig instead of whole pack. He lost 10 lbs. he feels more energetic.  He is now starting a new position and opening a new [employer] in the [place].  He is happy with his new position.  He feels things are going very well.  His wife going to have a knee surgery in few days.  Denies any mania, psychosis or any hallucination.  Since he reduced olanzapine he do not feel that his symptoms are coming back.  He sleeps good.  He has high LDL and triglycerides and hoping to have another blood work soon to have a better outcome.  He has no rash, itching tremors or shakes.  He is not taking trazodone, Ativan or hydroxyzine.  He has no rash from the Lamictal.  Like to keep his current medication which is Lamictal, Zoloft and olanzapine.  Past Psychiatric History:  H/O mania and impulsive behavior.  No h/o psychiatric inpatient treatment or suicidal attempt.  Tried Abilify and Geodon with poor outcome. Vistaril made groggy.     Psychiatric Specialty Exam: Physical Exam  Review of Systems  Weight 190 lb (86.2 kg).There is no height or weight on file to calculate BMI.  General Appearance: Casual  Eye Contact:  Good  Speech:  Clear and Coherent  Volume:  Normal  Mood:  Euthymic  Affect:  Appropriate  Thought Process:  Goal Directed  Orientation:  Full (Time, Place, and Person)  Thought Content:  Logical  Suicidal  Thoughts:  No  Homicidal Thoughts:  No  Memory:  Immediate;   Good Recent;   Good Remote;   Good  Judgement:  Good  Insight:  Present  Psychomotor Activity:  Normal  Concentration:  Concentration: Good  Recall:  Good  Fund of Knowledge:  Good  Language:  Good  Akathisia:  No  Handed:  Right  AIMS (if indicated):     Assets:  Communication Skills Desire for Improvement Housing Social Support Talents/Skills Transportation  ADL's:  Intact  Cognition:  WNL  Sleep:   fair      Assessment and Plan: Bipolar disorder type I.  Anxiety.  Patient doing better since cut down his smoking and we have reduced olanzapine and he is not seeing any worsening of symptoms.  He lost weight.  Continue Zoloft 150 mg daily, Lamictal 300 mg daily and olanzapine 15 mg at bedtime.  Recommended to call us back if is any question or any concern.  Patient like to have all his medication to [facility] for 90 days.  I recommended to call us back if he has any question about the medication.  Follow up in 3 months.  Follow Up Instructions:    I discussed the assessment and treatment plan with the patient. The patient was provided an opportunity to ask questions and all were answered. The patient agreed with the plan and demonstrated an understanding of the instructions.   The  patient was advised to call back or seek an in-person evaluation if the symptoms worsen or if the condition fails to improve as anticipated.  I provided 21 minutes of non-face-to-face time during this encounter.   Kathlee Nations, MD

## 2021-12-11 ENCOUNTER — Telehealth (HOSPITAL_COMMUNITY): Payer: Self-pay | Admitting: Psychiatry

## 2021-12-11 NOTE — Telephone Encounter (Signed)
Left message requesting call back to schedule 3 month follow up with Dr. Adele Schilder.

## 2021-12-12 ENCOUNTER — Other Ambulatory Visit (HOSPITAL_COMMUNITY): Payer: Self-pay | Admitting: Psychiatry

## 2021-12-12 DIAGNOSIS — F319 Bipolar disorder, unspecified: Secondary | ICD-10-CM

## 2021-12-21 ENCOUNTER — Other Ambulatory Visit: Payer: Self-pay | Admitting: Family Medicine

## 2021-12-21 DIAGNOSIS — E785 Hyperlipidemia, unspecified: Secondary | ICD-10-CM

## 2021-12-23 ENCOUNTER — Other Ambulatory Visit: Payer: Self-pay | Admitting: Family Medicine

## 2021-12-23 DIAGNOSIS — E785 Hyperlipidemia, unspecified: Secondary | ICD-10-CM

## 2021-12-23 MED ORDER — ROSUVASTATIN CALCIUM 40 MG PO TABS: 40.0000 mg | ORAL_TABLET | Freq: Every day | ORAL | 0 refills | Status: DC

## 2022-01-26 ENCOUNTER — Other Ambulatory Visit: Payer: Self-pay | Admitting: Family Medicine

## 2022-01-26 DIAGNOSIS — E785 Hyperlipidemia, unspecified: Secondary | ICD-10-CM

## 2022-02-27 ENCOUNTER — Telehealth (HOSPITAL_BASED_OUTPATIENT_CLINIC_OR_DEPARTMENT_OTHER): Payer: BC Managed Care – PPO | Admitting: Psychiatry

## 2022-02-27 ENCOUNTER — Encounter (HOSPITAL_COMMUNITY): Payer: Self-pay | Admitting: Psychiatry

## 2022-02-27 DIAGNOSIS — F419 Anxiety disorder, unspecified: Secondary | ICD-10-CM | POA: Diagnosis not present

## 2022-02-27 DIAGNOSIS — F319 Bipolar disorder, unspecified: Secondary | ICD-10-CM

## 2022-02-27 DIAGNOSIS — G2581 Restless legs syndrome: Secondary | ICD-10-CM

## 2022-02-27 MED ORDER — LORAZEPAM 0.5 MG PO TABS: 0.5000 mg | ORAL_TABLET | Freq: Every day | ORAL | 0 refills | Status: DC | PRN

## 2022-02-27 MED ORDER — TRAZODONE HCL 50 MG PO TABS: 25.0000 mg | ORAL_TABLET | Freq: Every evening | ORAL | 0 refills | Status: DC | PRN

## 2022-02-27 MED ORDER — LAMOTRIGINE 150 MG PO TABS: 150.0000 mg | ORAL_TABLET | Freq: Two times a day (BID) | ORAL | 0 refills | Status: DC

## 2022-02-27 MED ORDER — SERTRALINE HCL 100 MG PO TABS: 150.0000 mg | ORAL_TABLET | Freq: Every day | ORAL | 0 refills | Status: DC

## 2022-02-27 MED ORDER — OLANZAPINE 15 MG PO TABS: 15.0000 mg | ORAL_TABLET | Freq: Every day | ORAL | 0 refills | Status: DC

## 2022-02-27 NOTE — Progress Notes (Signed)
Virtual Visit via Video Note  I connected with Sheffield Slider on 02/27/22 at  2:40 PM EST by a video enabled telemedicine application and verified that I am speaking with the correct person using two identifiers.  Location: Patient: Home Provider: Home Office   I discussed the limitations of evaluation and management by telemedicine and the availability of in person appointments. The patient expressed understanding and agreed to proceed.  History of Present Illness: Patient is evaluated by video session.  He is doing well on his medication but for past few nights he has been taking Ativan to help his sleep and anxiety.  He is now working in a different [employer].  He likes his job but he need to go in person.  He denies any mania, psychosis, hallucination.  He denies any anger or any frustration.  He is happy that he quit smoking with the help of Chantix.  He admitted few pounds weight gain but overall he feels good.  He is excited about going to cruise trip in February with the family.  He wants to keep his current medication but like to have some Ativan in case he needed.  He has no rash, itching, tremors or shakes.  We have cut down his olanzapine 6 months ago from 30 mg to 15 mg.  He does not notice worsening of symptoms.  Past Psychiatric History:  H/O mania and impulsive behavior.  No h/o psychiatric inpatient treatment or suicidal attempt.  Tried Abilify and Geodon with poor outcome. Vistaril made groggy.    Psychiatric Specialty Exam: Physical Exam  Review of Systems  Weight 195 lb (88.5 kg).There is no height or weight on file to calculate BMI.  General Appearance: Casual  Eye Contact:  Good  Speech:  Clear and Coherent  Volume:  Normal  Mood:  Euthymic  Affect:  Congruent  Thought Process:  Goal Directed  Orientation:  Full (Time, Place, and Person)  Thought Content:  Logical  Suicidal Thoughts:  No  Homicidal Thoughts:  No  Memory:  Immediate;   Good Recent;   Good Remote;    Good  Judgement:  Good  Insight:  Present  Psychomotor Activity:  Normal  Concentration:  Concentration: Good and Attention Span: Good  Recall:  Good  Fund of Knowledge:  Good  Language:  Good  Akathisia:  No  Handed:  Left  AIMS (if indicated):     Assets:  Communication Skills Desire for [place] Talents/Skills Transportation  ADL's:  Intact  Cognition:  WNL  Sleep:   ok, sometimes take ativan      Assessment and Plan: Bipolar disorder type I.  Anxiety.  Patient stable on his current medication however occasionally he takes Ativan to help his sleep.  I recommend he can take as needed but if he feels he need to take more often than he should take the trazodone which he used to take with good response.  We will provide some trazodone and I will provide 10 tablets of Ativan in case he need for severe anxiety.  Continue Zoloft 150 mg daily, Lamictal 300 mg daily, olanzapine 15 mg at bedtime and we will provide 10 tablets of Ativan to take as needed and trazodone 50 mg as needed for insomnia.  Recommend to call us back if is any question or any concern.  Follow-up in 3 months.  Follow Up Instructions:    I discussed the assessment and treatment plan with the patient. The patient was provided  an opportunity to ask questions and all were answered. The patient agreed with the plan and demonstrated an understanding of the instructions.   The patient was advised to call back or seek an in-person evaluation if the symptoms worsen or if the condition fails to improve as anticipated.  Collaboration of Care: Other provider involved in patient's care AEB notes are available in epic to review.  Patient/Guardian was advised Release of Information must be obtained prior to any record release in order to collaborate their care with an outside provider. Patient/Guardian was advised if they have not already done so to contact the registration department to  sign all necessary forms in order for [place] to release information regarding their care.   Consent: Patient/Guardian gives verbal consent for treatment and assignment of benefits for services provided during this visit. Patient/Guardian expressed understanding and agreed to proceed.    I provided 15 minutes of non-face-to-face time during this encounter.   Kathlee Nations, MD

## 2022-03-07 ENCOUNTER — Encounter: Payer: Self-pay | Admitting: *Deleted

## 2022-03-07 ENCOUNTER — Encounter: Payer: Self-pay | Admitting: Internal Medicine

## 2022-03-07 ENCOUNTER — Ambulatory Visit: Payer: BC Managed Care – PPO | Attending: Internal Medicine | Admitting: Internal Medicine

## 2022-03-07 VITALS — BP 120/74 | HR 78 | Ht 68.0 in | Wt 205.2 lb

## 2022-03-07 DIAGNOSIS — Z8342 Family history of familial hypercholesterolemia: Secondary | ICD-10-CM | POA: Diagnosis not present

## 2022-03-07 DIAGNOSIS — E783 Hyperchylomicronemia: Secondary | ICD-10-CM | POA: Diagnosis not present

## 2022-03-07 DIAGNOSIS — Z006 Encounter for examination for normal comparison and control in clinical research program: Secondary | ICD-10-CM

## 2022-03-07 DIAGNOSIS — E781 Pure hyperglyceridemia: Secondary | ICD-10-CM | POA: Diagnosis not present

## 2022-03-07 MED ORDER — ICOSAPENT ETHYL 1 G PO CAPS: 2.0000 g | ORAL_CAPSULE | Freq: Two times a day (BID) | ORAL | 3 refills | Status: DC

## 2022-03-07 NOTE — Patient Instructions (Signed)
Medication Instructions:  START icosapent ethyl 2 capsules twice daily  STOP over-the-counter fish oil  CONTINUE all other current medications  *If you need a refill on your cardiac medications before your next appointment, please call your pharmacy*   Lab Work: FASTING lab work to check cholesterol in 3-4 months with Dr. Debara Pickett   If you have labs (blood work) drawn today and your tests are completely normal, you will receive your results only by: [place] (if you have MyChart) OR A paper copy in the mail If you have any lab test that is abnormal or we need to change your treatment, we will call you to review the results.   Testing/Procedures: Dr. Debara Pickett has ordered a CT coronary calcium score.   Test locations:  [place]   This is $99 out of pocket.   Coronary CalciumScan A coronary calcium scan is an imaging test used to look for deposits of calcium and other fatty materials (plaques) in the inner lining of the blood vessels of the heart (coronary arteries). These deposits of calcium and plaques can partly clog and narrow the coronary arteries without producing any symptoms or warning signs. This puts a person at risk for a heart attack. This test can detect these deposits before symptoms develop. Tell a health care provider about: Any allergies you have. All medicines you are taking, including vitamins, herbs, eye drops, creams, and over-the-counter medicines. Any problems you or family members have had with anesthetic medicines. Any blood disorders you have. Any surgeries you have had. Any medical conditions you have. Whether you are pregnant or may be pregnant. What are the risks? Generally, this is a safe procedure. However, problems may occur, including: Harm to a pregnant woman and her unborn baby. This test involves the use of radiation. Radiation exposure can be dangerous to a pregnant woman and her unborn baby. If you are  pregnant, you generally should not have this procedure done. Slight increase in the risk of cancer. This is because of the radiation involved in the test. What happens before the procedure? No preparation is needed for this procedure. What happens during the procedure? You will undress and remove any jewelry around your neck or chest. You will put on a hospital gown. Sticky electrodes will be placed on your chest. The electrodes will be connected to an electrocardiogram (ECG) machine to record a tracing of the electrical activity of your heart. A CT scanner will take pictures of your heart. During this time, you will be asked to lie still and hold your breath for 2-3 seconds while a picture of your heart is being taken. The procedure may vary among health care providers and hospitals. What happens after the procedure? You can get dressed. You can return to your normal activities. It is up to you to get the results of your test. Ask your health care provider, or the department that is doing the test, when your results will be ready. Summary A coronary calcium scan is an imaging test used to look for deposits of calcium and other fatty materials (plaques) in the inner lining of the blood vessels of the heart (coronary arteries). Generally, this is a safe procedure. Tell your health care provider if you are pregnant or may be pregnant. No preparation is needed for this procedure. A CT scanner will take pictures of your heart. You can return to your normal activities after the scan is done. This information is not intended to replace advice  given to you by your health care provider. Make sure you discuss any questions you have with your health care provider. Document Released: 09/13/2007 Document Revised: 02/04/2016 Document Reviewed: 02/04/2016 Elsevier Interactive Patient Education  2017 [place]: At [facility], you and your health needs are our priority.  As  part of our continuing mission to provide you with exceptional heart care, we have created designated Provider Care Teams.  These Care Teams include your primary Cardiologist (physician) and Advanced Practice Providers (APPs -  Physician Assistants and Nurse Practitioners) who all work together to provide you with the care you need, when you need it.  We recommend signing up for the patient portal called "MyChart".  Sign up information is provided on this After Visit Summary.  MyChart is used to connect with patients for Virtual Visits (Telemedicine).  Patients are able to view lab/test results, encounter notes, upcoming appointments, etc.  Non-urgent messages can be sent to your provider as well.   To learn more about what you can do with MyChart, go to [email or web address].    Your next appointment:    3-4 months with Dr. Debara Pickett

## 2022-03-07 NOTE — Research (Signed)
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 He just ate lunch, so we will not be able to get him in today, as today is the last day for screening.

## 2022-03-07 NOTE — Progress Notes (Signed)
LIPID CLINIC CONSULT NOTE  Chief Complaint:  High triglycerides  Primary Care Physician: Timothy Minium, MD  Primary Cardiologist:  None  HPI:  Timothy Burnett is a 56 y.o. male who is being seen today for the evaluation of high triglycerides at the request of Timothy Burnett, Timothy Millet, MD. this is a pleasant 56 year old male kindly referred for evaluation management of high triglycerides.  He reports a history of high cholesterol and a strong family history of high cholesterol and heart disease mostly in his mother who had previously had 67 stents.  She developed heart disease as early as age 42.  He has had both high triglycerides and elevated LDL cholesterol.  He is on rosuvastatin 40 mg daily.  He also takes fenofibrate 160 mg and 1500 mg fish oil daily.  His most recent labs showed total cholesterol 189, triglycerides 403, HDL 16.  And a direct LDL was measured at 127.  He also has a history of bipolar disorder and is on a couple of medications including olanzapine, medication that is known to increase cholesterol and triglycerides.  He denies any known cardiovascular disease.  He had previously been contacted about our clinical trial for triglyceride lowering in the past but had not been screened for that.  PMHx:  Past Medical History:  Diagnosis Date   Bipolar disorder ([place])    Depression    Hyperlipemia    Ulcer    stomach    Past Surgical History:  Procedure Laterality Date   feet surgery     bone spurs   FOOT SURGERY  2012   [place]   TONSILLECTOMY      FAMHx:  Family History  Problem Relation Age of Onset   Hyperlipidemia Mother    Heart disease Mother    Hypertension Mother    Hyperlipidemia Father    Colon cancer Neg Hx    Colon polyps Neg Hx    Esophageal cancer Neg Hx    Rectal cancer Neg Hx    Stomach cancer Neg Hx     SOCHx:   reports that he has never smoked. He has never used smokeless tobacco. He reports that he does not drink alcohol and does not use  drugs.  ALLERGIES:  Allergies  Allergen Reactions   Penicillins Rash    ROS: Pertinent items noted in HPI and remainder of comprehensive ROS otherwise negative.  HOME MEDS: Current Outpatient Medications on File Prior to Visit  Medication Sig Dispense Refill   B Complex Vitamins (B COMPLEX PO) Take by mouth.     fenofibrate 160 MG tablet TAKE ONE TABLET BY MOUTH ONE TIME DAILY 30 tablet 0   hydrOXYzine (ATARAX/VISTARIL) 25 MG tablet Take 1 tablet (25 mg total) by mouth at bedtime. 90 tablet 0   lamoTRIgine (LAMICTAL) 150 MG tablet Take 1 tablet (150 mg total) by mouth 2 (two) times daily. 180 tablet 0   LORazepam (ATIVAN) 0.5 MG tablet Take 1 tablet (0.5 mg total) by mouth daily as needed for anxiety. 10 tablet 0   Multiple Vitamin (MULTIVITAMIN) tablet Take 1 tablet by mouth daily.     OLANZapine (ZYPREXA) 15 MG tablet Take 1 tablet (15 mg total) by mouth at bedtime. 90 tablet 0   rosuvastatin (CRESTOR) 40 MG tablet TAKE ONE TABLET BY MOUTH ONE TIME DAILY 30 tablet 0   sertraline (ZOLOFT) 100 MG tablet Take 1.5 tablets (150 mg total) by mouth daily. 135 tablet 0   traZODone (DESYREL) 50 MG tablet Take 0.5-1  tablets (25-50 mg total) by mouth at bedtime as needed for sleep. 30 tablet 0   varenicline (CHANTIX CONTINUING MONTH PAK) 1 MG tablet Take 1 tablet (1 mg total) by mouth 2 (two) times daily. 60 tablet 2   No current facility-administered medications on file prior to visit.    LABS/IMAGING: No results found for this or any previous visit (from the past 48 hour(s)). No results found.  LIPID PANEL:    Component Value Date/Time   CHOL 189 08/21/2021 0854   TRIG (H) 08/21/2021 0854    403.0 Triglyceride is over 400; calculations on Lipids are invalid.   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   Timothy Burnett has high cholesterol, particular high triglycerides and an elevated LDL despite being on high-dose rosuvastatin.  There is family history of heart disease in his mother.  I suspect a genetic cholesterol disorder however his cholesterol may also be worsened by being on olanzapine, which is noted to do this.  It is unlikely he would be off that medicine therefore we will need to treat around it.  I would recommend stopping over-the-counter fish oil and will start Vascepa 2 g twice daily in addition to his fenofibrate and statin.  He might benefit from other therapies.  I am recommending a calcium score to further risk stratify him.  I will reach out again to our research nurse regarding the clinical trial that she previously contacted him about.  It is my understanding, however today is the last day the trial will be enrolling and I suspect he may not have a clinical trial option for him at this point.  Plan follow-up with me with repeat labs in about 3 to 4 months.  Thanks again for the kind referral.  Timothy Casino, MD, FACC, [place] Director of the [facility] Lipid Disorders &  Cardiovascular Risk Reduction Clinic Diplomate of the American Board of Clinical Lipidology Attending Cardiologist  Direct Dial: [phone]  Fax: [phone]  Website:  [email or web address] 03/07/2022, 12:36 PM

## 2022-03-12 ENCOUNTER — Other Ambulatory Visit: Payer: Self-pay | Admitting: Family Medicine

## 2022-03-12 DIAGNOSIS — E785 Hyperlipidemia, unspecified: Secondary | ICD-10-CM

## 2022-03-20 ENCOUNTER — Telehealth: Payer: Self-pay

## 2022-03-20 NOTE — Telephone Encounter (Signed)
**Note De-Identified  Obfuscation** I attempted a Vascepa PA through covermymds but had to cancel it and start a Icosapent PA per [employer]. Icosapent PA started through [facility]. Key: [id number]

## 2022-03-20 NOTE — Telephone Encounter (Signed)
**Note De-Identified  Obfuscation** [facility] [address] [place], [place] [address]  [place] Plan nameWynetta Burnett [employer]PERMARKETS [id number]  03/20/2022 The denial was based on our criteria for Icosapent Cap 1gm.  Per your health plan's criteria, this drug is covered if you meet the following: (1) One of the following: (A) You have a heart disease (for example: established coronary artery disease, cerebrovascular or carotid disease, peripheral artery disease).  Will forward this message to Dr Debara Pickett and his nurse for advisement to the pt.

## 2022-03-25 ENCOUNTER — Ambulatory Visit (HOSPITAL_BASED_OUTPATIENT_CLINIC_OR_DEPARTMENT_OTHER): Payer: BC Managed Care – PPO

## 2022-03-25 NOTE — Telephone Encounter (Signed)
Vascepa denied- will await calcium score. If abnormal, he would have "established coronary artery disease" and then should re-attempt PA  -[place]

## 2022-03-27 ENCOUNTER — Other Ambulatory Visit: Payer: Self-pay | Admitting: Family Medicine

## 2022-03-27 DIAGNOSIS — E785 Hyperlipidemia, unspecified: Secondary | ICD-10-CM

## 2022-03-27 NOTE — Telephone Encounter (Signed)
Update sent to patient in MyChart Calcium Score test 04/01/22

## 2022-04-01 ENCOUNTER — Ambulatory Visit (HOSPITAL_BASED_OUTPATIENT_CLINIC_OR_DEPARTMENT_OTHER)
Admission: RE | Admit: 2022-04-01 | Discharge: 2022-04-01 | Disposition: A | Discharge status: Home / Self Care | Payer: BC Managed Care – PPO | Source: Ambulatory Visit | Attending: Internal Medicine | Admitting: Internal Medicine

## 2022-04-01 DIAGNOSIS — E783 Hyperchylomicronemia: Secondary | ICD-10-CM | POA: Insufficient documentation

## 2022-04-02 ENCOUNTER — Telehealth (HOSPITAL_COMMUNITY): Payer: Self-pay

## 2022-04-02 DIAGNOSIS — G2581 Restless legs syndrome: Secondary | ICD-10-CM

## 2022-04-02 MED ORDER — TRAZODONE HCL 50 MG PO TABS: 25.0000 mg | ORAL_TABLET | Freq: Every evening | ORAL | 1 refills | Status: DC | PRN

## 2022-04-02 NOTE — Telephone Encounter (Signed)
  Recevied fax from pharmacy requesting a refill for the following medication  Last visit 02/27/22 Next visit 06/02/22        Disp Refills Start End   traZODone (DESYREL) 50 MG tablet 30 tablet 0 02/27/2022    Sig - Route: Take 0.5-1 tablets (25-50 mg total) by mouth at bedtime as needed for sleep. - Oral   Sent to pharmacy as: traZODone (DESYREL) 50 MG tablet   E-Prescribing Status: Receipt confirmed by pharmacy (02/27/2022  2:48 PM EST)     Requested pharmacy: [facility] [address] - [place], [facility] [address] Phone: [phone]  Fax: [phone]

## 2022-04-02 NOTE — Telephone Encounter (Signed)
Send to pharmacy. Please inform patient.

## 2022-04-07 NOTE — Telephone Encounter (Addendum)
**Note De-Identified Timothy Burnett Obfuscation** I have faxed a Vascepa appeal letter with the pts Calcium Score results attached to [facility] [employer] Benefits Dept at [phone] as instructed in the denial letter we received.  Fax: Tx 'ok' Report CONE_EMAIL-to-Fax Farin Buhman, Mardene Celeste This message was sent Tyesha Joffe [facility], a product from [employer] [email or web address]                    -------Fax Transmission Report-------  To:               Recipient at [phone] Subject:          Fw: Urgent Appeal for Vacepa Result:           The transmission was successful. Explanation:      All Pages Ok Pages Sent:       10 Connect Time:     3 minutes, 45 seconds Transmit Time:    04/07/2022 12:14 Transfer Rate:    14400 Status Code:      0000 Retry Count:      0 Job Id:           [id number] Unique Id:        [id number] Fax Line:         4 Fax Server:       [employer]

## 2022-04-07 NOTE — Telephone Encounter (Signed)
Coronary artery calcifications/disease - ICD I25.10 -- by recent test  Routed to [facility] Via LPN for resubmission of Vascepa PA with this new info

## 2022-05-06 NOTE — Telephone Encounter (Signed)
**Note De-Identified  Obfuscation** I called [phone] and was finally able to s/w Oli at the [facility] benefits dept. who advised me that they did not receive the appeal that I faxed to them on 1/8 24. I do have confirmation that that appeal was faxed successfully but per [facility], it has not been received.  I faxed it to them again while we were on the phone and Minette Brine verified that they did receive it this time. Per Minette Brine they will make a determination in 3 days or so (?) and that they will fax a determination letter to [facility] office.  Minette Brine also states that if we do not receive the determination to call them back at [phone] ext: [phone] for an update.

## 2022-05-16 ENCOUNTER — Other Ambulatory Visit: Payer: Self-pay | Admitting: Family Medicine

## 2022-05-16 DIAGNOSIS — E785 Hyperlipidemia, unspecified: Secondary | ICD-10-CM

## 2022-05-21 NOTE — Telephone Encounter (Signed)
**Note De-Identified  Obfuscation** I called [employer] benefits X 4 and the call was dropped each time.  I was finally able to s/w Brianna who advised me that this Vascepa PA has been approved until 05/07/2023 and that they mailed the pt an approval letter on 05/06/2022 but they did not fax a determination letter to [place] as I requested that they do.  Per Denton Ar, she is faxing the approval letter to [phone] and that we should be receiving it within 5 to 10 mins.

## 2022-05-21 NOTE — Telephone Encounter (Signed)
Update sent to patient via [place]

## 2022-05-26 ENCOUNTER — Other Ambulatory Visit (HOSPITAL_COMMUNITY): Payer: Self-pay | Admitting: Psychiatry

## 2022-05-26 DIAGNOSIS — F319 Bipolar disorder, unspecified: Secondary | ICD-10-CM

## 2022-05-26 DIAGNOSIS — F419 Anxiety disorder, unspecified: Secondary | ICD-10-CM

## 2022-05-26 DIAGNOSIS — G2581 Restless legs syndrome: Secondary | ICD-10-CM

## 2022-05-29 ENCOUNTER — Telehealth (HOSPITAL_COMMUNITY): Payer: BC Managed Care – PPO | Admitting: Psychiatry

## 2022-06-02 ENCOUNTER — Encounter (HOSPITAL_COMMUNITY): Payer: Self-pay | Admitting: Psychiatry

## 2022-06-02 ENCOUNTER — Telehealth (HOSPITAL_BASED_OUTPATIENT_CLINIC_OR_DEPARTMENT_OTHER): Payer: BC Managed Care – PPO | Admitting: Psychiatry

## 2022-06-02 VITALS — Wt 200.0 lb

## 2022-06-02 DIAGNOSIS — F319 Bipolar disorder, unspecified: Secondary | ICD-10-CM

## 2022-06-02 DIAGNOSIS — F419 Anxiety disorder, unspecified: Secondary | ICD-10-CM | POA: Diagnosis not present

## 2022-06-02 MED ORDER — GABAPENTIN 100 MG PO CAPS: 100.0000 mg | ORAL_CAPSULE | Freq: Every day | ORAL | 0 refills | Status: DC

## 2022-06-02 MED ORDER — SERTRALINE HCL 100 MG PO TABS: 150.0000 mg | ORAL_TABLET | Freq: Every day | ORAL | 0 refills | Status: DC

## 2022-06-02 MED ORDER — OLANZAPINE 15 MG PO TABS: 15.0000 mg | ORAL_TABLET | Freq: Every day | ORAL | 0 refills | Status: DC

## 2022-06-02 MED ORDER — LAMOTRIGINE 150 MG PO TABS: 150.0000 mg | ORAL_TABLET | Freq: Two times a day (BID) | ORAL | 0 refills | Status: DC

## 2022-06-02 NOTE — Progress Notes (Signed)
[facility] MD Virtual Progress Note   Patient Location: Work Provider Location: Home Office  I connect with patient by video and verified that I am speaking with correct person by using two identifiers. I discussed the limitations of evaluation and management by telemedicine and the availability of in person appointments. I also discussed with the patient that there may be a patient responsible charge related to this service. The patient expressed understanding and agreed to proceed.  Timothy Burnett [id number] y.o.  06/02/2022 3:23 PM    History of Present Illness:  Patient is evaluated by video session.  He describes his mood is stable and denies any mania, psychosis or any hallucination.  Recently had a cruise trip for 10 days with his wife and had a good time to [place] and other places.  He reported poor sleep and despite taking trazodone he did not see any improvement.  He noticed reviewed things after taking the trazodone as pulling pillows at night.  He feels good because he is not smoking since September.  Like to try something different for his sleep.  He has not taken Ativan since the last visit.  He is compliant with olanzapine, Lamictal and Zoloft.  He has no tremors, shakes or any EPS.  His job is going well.  He is now adjusted very well to work in person at [employer].  He denies any panic attack.  He denies any hallucination or any suicidal thoughts.  Past Psychiatric History:  H/O mania and impulsive behavior.  No h/o psychiatric inpatient treatment or suicidal attempt.  Tried Abilify and Geodon with poor outcome. Vistaril made groggy. Trazodone caused dreams, Melatonin did not work.    Outpatient Encounter Medications as of 06/02/2022  Medication Sig   B Complex Vitamins (B COMPLEX PO) Take by mouth.   fenofibrate 160 MG tablet TAKE ONE TABLET BY MOUTH ONE TIME DAILY   hydrOXYzine (ATARAX/VISTARIL) 25 MG tablet Take 1 tablet (25 mg total) by mouth at  bedtime.   icosapent Ethyl (VASCEPA) 1 g capsule Take 2 capsules (2 g total) by mouth 2 (two) times daily.   lamoTRIgine (LAMICTAL) 150 MG tablet Take 1 tablet (150 mg total) by mouth 2 (two) times daily.   LORazepam (ATIVAN) 0.5 MG tablet Take 1 tablet (0.5 mg total) by mouth daily as needed for anxiety.   Multiple Vitamin (MULTIVITAMIN) tablet Take 1 tablet by mouth daily.   OLANZapine (ZYPREXA) 15 MG tablet Take 1 tablet (15 mg total) by mouth at bedtime.   rosuvastatin (CRESTOR) 40 MG tablet TAKE ONE TABLET BY MOUTH ONE TIME DAILY   sertraline (ZOLOFT) 100 MG tablet Take 1.5 tablets (150 mg total) by mouth daily.   traZODone (DESYREL) 50 MG tablet Take 0.5-1 tablets (25-50 mg total) by mouth at bedtime as needed for sleep.   varenicline (CHANTIX CONTINUING MONTH PAK) 1 MG tablet Take 1 tablet (1 mg total) by mouth 2 (two) times daily.   No facility-administered encounter medications on file as of 06/02/2022.    No results found for this or any previous visit (from the past 2160 hour(s)).   Psychiatric Specialty Exam: Physical Exam  Review of Systems  Weight 200 lb (90.7 kg).There is no height or weight on file to calculate BMI.  General Appearance: Casual  Eye Contact:  Good  Speech:  Clear and Coherent  Volume:  Normal  Mood:  Euthymic  Affect:  Congruent  Thought Process:  Goal Directed  Orientation:  Full (Time, Place,  and Person)  Thought Content:  WDL  Suicidal Thoughts:  No  Homicidal Thoughts:  No  Memory:  Immediate;   Good Recent;   Good Remote;   Good  Judgement:  Good  Insight:  Present  Psychomotor Activity:  Normal  Concentration:  Concentration: Good and Attention Span: Good  Recall:  Good  Fund of Knowledge:  Good  Language:  Good  Akathisia:  No  Handed:  Right  AIMS (if indicated):     Assets:  Communication Skills Desire for [place] Talents/Skills Transportation  ADL's:  Intact  Cognition:  WNL  Sleep:   poor     Assessment/Plan: Bipolar 1 disorder (HCC) - Plan: lamoTRIgine (LAMICTAL) 150 MG tablet, OLANZapine (ZYPREXA) 15 MG tablet, sertraline (ZOLOFT) 100 MG tablet  Anxiety - Plan: sertraline (ZOLOFT) 100 MG tablet, gabapentin (NEURONTIN) 100 MG capsule  Discontinue trazodone since it did not work.  In the past he had tried hydroxyzine, melatonin which also did not work.  Recommend to try low-dose gabapentin.  Patient agreed with the plan.  We will start gabapentin 100-200 mg at bedtime.  Continue Zoloft 150 mg daily, Lamictal 300 mg daily and olanzapine 15 mg at bedtime.  We will provide a 30-day supply of gabapentin however if he feel it works then he can call us to get a 90-day supply.  Recommended to call us back if is any question or any concern.  Follow-up in 3 months   Follow Up Instructions:     I discussed the assessment and treatment plan with the patient. The patient was provided an opportunity to ask questions and all were answered. The patient agreed with the plan and demonstrated an understanding of the instructions.   The patient was advised to call back or seek an in-person evaluation if the symptoms worsen or if the condition fails to improve as anticipated.    Collaboration of Care: Other provider involved in patient's care AEB notes are available in epic to review.  Patient/Guardian was advised Release of Information must be obtained prior to any record release in order to collaborate their care with an outside provider. Patient/Guardian was advised if they have not already done so to contact the registration department to sign all necessary forms in order for [place] to release information regarding their care.   Consent: Patient/Guardian gives verbal consent for treatment and assignment of benefits for services provided during this visit. Patient/Guardian expressed understanding and agreed to proceed.     I provided 15 minutes of non face to face time during this  encounter.  Kathlee Nations, MD 06/02/2022

## 2022-06-13 ENCOUNTER — Other Ambulatory Visit (HOSPITAL_COMMUNITY): Payer: Self-pay | Admitting: Psychiatry

## 2022-06-13 ENCOUNTER — Other Ambulatory Visit: Payer: Self-pay | Admitting: Family Medicine

## 2022-06-13 DIAGNOSIS — E785 Hyperlipidemia, unspecified: Secondary | ICD-10-CM

## 2022-06-13 DIAGNOSIS — G2581 Restless legs syndrome: Secondary | ICD-10-CM

## 2022-06-17 ENCOUNTER — Telehealth (HOSPITAL_COMMUNITY): Payer: Self-pay | Admitting: *Deleted

## 2022-06-17 NOTE — Telephone Encounter (Signed)
Rx Refill Request --- [facility] [address] [place], [place] - [address]   traZODone (DESYREL) 50 MG tablet 30 tablet   Last Fill:: 04/03/22 for 2 RF  Next appt  08/28/22 Last appt   06/02/22

## 2022-06-17 NOTE — Telephone Encounter (Signed)
It was d/c on last appointment.

## 2022-06-27 ENCOUNTER — Other Ambulatory Visit (HOSPITAL_COMMUNITY): Payer: Self-pay | Admitting: Psychiatry

## 2022-06-27 DIAGNOSIS — F419 Anxiety disorder, unspecified: Secondary | ICD-10-CM

## 2022-07-01 ENCOUNTER — Telehealth (HOSPITAL_COMMUNITY): Payer: Self-pay | Admitting: *Deleted

## 2022-07-01 NOTE — Telephone Encounter (Signed)
Pt called with c/o "numbness in my fingertips and my tongue". When writer returned call pt also ssid that he has been having intermittent SOB. Pt feels this is r/t the Gabapentin 100 mg 1 to 2 QHS on 06/02/22. Pt denies any rashes or related symptoms. Denies any other new medications or change in household products. Only allergy is to PCN with a rash. Please review and advise.

## 2022-07-02 ENCOUNTER — Other Ambulatory Visit (HOSPITAL_COMMUNITY): Payer: Self-pay | Admitting: *Deleted

## 2022-07-02 MED ORDER — BUSPIRONE HCL 5 MG PO TABS: ORAL_TABLET | ORAL | 0 refills | Status: DC

## 2022-07-02 NOTE — Telephone Encounter (Signed)
Recommend to stop Gabapentin and observe symptoms. If still present than need to see or call PCP.

## 2022-07-02 NOTE — Telephone Encounter (Signed)
Would you suggest an alternative medication for his anxiety?

## 2022-07-02 NOTE — Telephone Encounter (Signed)
Ok. Thank you. Pt agrees. This nurse advised pt to go to UC or same day PCP if after d/c Gabapentin for a few days symptoms persist. Pt verbalizes understanding. Pt held medication last night and says that the SOB has lessened some.

## 2022-07-02 NOTE — Telephone Encounter (Signed)
He can try Buspar 5 mg at bed time. After he stopped Gabapentin and symptoms resolved.

## 2022-07-06 ENCOUNTER — Other Ambulatory Visit: Payer: Self-pay | Admitting: Family Medicine

## 2022-07-06 ENCOUNTER — Other Ambulatory Visit (HOSPITAL_COMMUNITY): Payer: Self-pay | Admitting: Psychiatry

## 2022-07-06 DIAGNOSIS — E785 Hyperlipidemia, unspecified: Secondary | ICD-10-CM

## 2022-07-06 DIAGNOSIS — F319 Bipolar disorder, unspecified: Secondary | ICD-10-CM

## 2022-07-13 LAB — NMR, LIPOPROFILE
Cholesterol, Total: 193 mg/dL (ref 100–199)
HDL Particle Number: 18.8 umol/L — ABNORMAL LOW (ref >=30.5)
HDL-C: 19 mg/dL — ABNORMAL LOW (ref >39)
LDL Particle Number: 2401 nmol/L — ABNORMAL HIGH (ref <1000)
LDL Size: 20.4 nm — ABNORMAL LOW (ref >20.5)
LDL-C (NIH Calc): 127 mg/dL — ABNORMAL HIGH (ref 0–99)
LP-IR Score: 91 — ABNORMAL HIGH (ref <=45)
Small LDL Particle Number: 1602 nmol/L — ABNORMAL HIGH (ref <=527)
Triglycerides: 261 mg/dL — ABNORMAL HIGH (ref 0–149)

## 2022-07-13 LAB — APOLIPOPROTEIN B: Apolipoprotein B: 117 mg/dL — ABNORMAL HIGH (ref <90)

## 2022-07-13 LAB — LIPOPROTEIN A (LPA): Lipoprotein (a): 140.2 nmol/L — ABNORMAL HIGH (ref <75.0)

## 2022-07-22 ENCOUNTER — Encounter: Payer: Self-pay | Admitting: Internal Medicine

## 2022-07-22 ENCOUNTER — Ambulatory Visit: Payer: BC Managed Care – PPO | Attending: Internal Medicine | Admitting: Internal Medicine

## 2022-07-22 VITALS — BP 112/78 | HR 78 | Ht 68.0 in | Wt 212.0 lb

## 2022-07-22 DIAGNOSIS — R400 Somnolence: Secondary | ICD-10-CM

## 2022-07-22 DIAGNOSIS — R0683 Snoring: Secondary | ICD-10-CM | POA: Diagnosis not present

## 2022-07-22 DIAGNOSIS — E785 Hyperlipidemia, unspecified: Secondary | ICD-10-CM | POA: Diagnosis not present

## 2022-07-22 DIAGNOSIS — R931 Abnormal findings on diagnostic imaging of heart and coronary circulation: Secondary | ICD-10-CM

## 2022-07-22 DIAGNOSIS — Z9189 Other specified personal risk factors, not elsewhere classified: Secondary | ICD-10-CM

## 2022-07-22 DIAGNOSIS — R0602 Shortness of breath: Secondary | ICD-10-CM

## 2022-07-22 DIAGNOSIS — E7841 Elevated Lipoprotein(a): Secondary | ICD-10-CM

## 2022-07-22 MED ORDER — REPATHA SURECLICK 140 MG/ML ~~LOC~~ SOAJ: 140.0000 mg | SUBCUTANEOUS | 11 refills | Status: DC

## 2022-07-22 NOTE — Patient Instructions (Signed)
Medication Instructions:  Dr. Rennis Golden recommends Repatha (PCSK9). This is an injectable cholesterol medication self-administered once every 14 days. This medication will likely need prior approval with your insurance company, which we will work on. If the medication is not approved initially, we may need to do an appeal with your insurance.   Administer medication in area of fatty tissue such as abdomen, outer thigh, back of upper arm - and rotate site with each injection Store medication in refrigerator until ready to administer - allow to sit at room temp for 30 mins - 1 hour prior to injection Dispose of medication in a SHARPS container - your pharmacy should be able to direct you on this and proper disposal   If you need a co-pay card for Repatha: Lawsponsor.fr If you need a co-pay card for Praluent: [email or web address]  Patient Assistance:    These foundations have funds at various times.   The PAN Foundation: https://www.panfoundation.org/disease-funds/hypercholesterolemia/ -- can sign up for wait list  The [facility] offers assistance to help pay for medication copays.  They will cover copays for all cholesterol lowering meds, including statins, fibrates, omega-3 fish oils like Vascepa, ezetimibe, Repatha, Praluent, Nexletol, Nexlizet.  The cards are usually good for $2,500 or 12 months, whichever comes first. Go to healthwellfoundation.org Click on "Apply Now" Answer questions as to whom is applying (patient or representative) Your disease fund will be "hypercholesterolemia - Medicare access" They will ask questions about finances and which medications you are taking for cholesterol When you submit, the approval is usually within minutes.  You will need to print the card information from the site You will need to show this information to your pharmacy, they will bill your Medicare Part D plan first -then bill Health Well --for  the copay.   You can also call them at [phone], although the hold times can be quite long.    *If you need a refill on your cardiac medications before your next appointment, please call your pharmacy*   Testing/Procedures: Your physician has requested that you have a lexiscan myoview. For further information please visit [email or web address] Please follow instruction sheet, as given. This will take place at [address]  How to prepare for your Myocardial Perfusion Test: Do not eat or drink 3 hours prior to your test, except you may have water. Do not consume products containing caffeine (regular or decaffeinated) 12 hours prior to your test. (ex: coffee, chocolate, sodas, tea). Do bring a list of your current medications with you.  If not listed below, you may take your medications as normal. Do wear comfortable clothes (no dresses or overalls) and walking shoes, tennis shoes preferred (No heels or open toe shoes are allowed). Do NOT wear cologne, perfume, aftershave, or lotions (deodorant is allowed). The test will take approximately 3 to 4 hours to complete If these instructions are not followed, your test will have to be rescheduled.   WatchPAT?  Is a FDA cleared portable home sleep study test that uses a watch and 3 points of contact to monitor 7 different channels, including your heart rate, oxygen saturations, body position, snoring, and chest motion.  The study is easy to use from the comfort of your own home and accurately detect sleep apnea.  Before bed, you attach the chest sensor, attached the sleep apnea bracelet to your nondominant hand, and attach the finger probe.  After the study, the raw data is downloaded from the watch and scored for apnea events.  For more information: [email or web address]  Patient Testing Instructions:  Do not put battery into the device until bedtime when you are ready to begin the test. Please call the support  number if you need assistance after following the instructions below: 24 hour support line- [phone] or ITAMAR support at [phone] (option 2)  Download the IntelWatchPAT One" app through the google play store or App Store  Be sure to turn on or enable access to bluetooth in settlings on your smartphone/ device  Make sure no other bluetooth devices are on and within the vicinity of your smartphone/ device and WatchPAT watch during testing.  Make sure to leave your smart phone/ device plugged in and charging all night.  When ready for bed:  Follow the instructions step by step in the WatchPAT One App to activate the testing device. For additional instructions, including video instruction, visit the WatchPAT One video on Youtube. You can search for WatchPat One within Youtube (video is 4 minutes and 18 seconds) or enter: https://youtube/watch?v=BCce_vbiwxE Please note: You will be prompted to enter a Pin to connect via bluetooth when starting the test. The PIN will be assigned to you when you receive the test.  The device is disposable, but it recommended that you retain the device until you receive a call letting you know the study has been received and the results have been interpreted.  We will let you know if the study did not transmit to [place] properly after the test is completed. You do not need to call us to confirm the receipt of the test.  Please complete the test within 48 hours of receiving PIN.   Frequently Asked Questions:  What is Watch Dennie Bible one?  A single use fully disposable home sleep apnea testing device and will not need to be returned after completion.  What are the requirements to use WatchPAT one?  The be able to have a successful watchpat one sleep study, you should have your Watch pat one device, your smart phone, watch pat one app, your PIN number and Internet access What type of phone do I need?  You should have a smart phone that uses Android 5.1 and above or any  Iphone with IOS 10 and above How can I download the WatchPAT one app?  Based on your device type search for WatchPAT one app either in google play for android devices or APP store for Iphone's Where will I get my PIN for the study?  Your PIN will be provided by your physician's office. It is used for authentication and if you lose/forget your PIN, please reach out to your providers office.  I do not have Internet at home. Can I do WatchPAT one study?  WatchPAT One needs Internet connection throughout the night to be able to transmit the sleep data. You can use your home/local internet or your cellular's data package. However, it is always recommended to use home/local Internet. It is estimated that between 20MB-30MB will be used with each study.However, the application will be looking for space in the phone to start the study.  What happens if I lose internet or bluetooth connection?  During the internet disconnection, your phone will not be able to transmit the sleep data. All the data, will be stored in your phone. As soon as the internet connection is back on, the phone will being sending the sleep data. During the bluetooth disconnection, WatchPAT one will not be able to to send the sleep data to your  phone. Data will be kept in the [facility] one until two devices have bluetooth connection back on. As soon as the connection is back on, WatchPAT one will send the sleep data to the phone.  How long do I need to wear the WatchPAT one?  After you start the study, you should wear the device at least 6 hours.  How far should I keep my phone from the device?  During the night, your phone should be within 15 feet.  What happens if I leave the room for restroom or other reasons?  Leaving the room for any reason will not cause any problem. As soon as your get back to the room, both devices will reconnect and will continue to send the sleep data. Can I use my phone during the sleep study?  Yes, you can  use your phone as usual during the study. But it is recommended to put your watchpat one on when you are ready to go to bed.  How will I get my study results?  A soon as you completed your study, your sleep data will be sent to the provider. They will then share the results with you when they are ready.   Follow-Up: At [facility], you and your health needs are our priority.  As part of our continuing mission to provide you with exceptional heart care, we have created designated Provider Care Teams.  These Care Teams include your primary Cardiologist (physician) and Advanced Practice Providers (APPs -  Physician Assistants and Nurse Practitioners) who all work together to provide you with the care you need, when you need it.  We recommend signing up for the patient portal called "MyChart".  Sign up information is provided on this After Visit Summary.  MyChart is used to connect with patients for Virtual Visits (Telemedicine).  Patients are able to view lab/test results, encounter notes, upcoming appointments, etc.  Non-urgent messages can be sent to your provider as well.   To learn more about what you can do with MyChart, go to [email or web address].    Your next appointment:   3-4 month(s)  Provider:   Dr. Rennis Golden

## 2022-07-22 NOTE — Progress Notes (Signed)
LIPID CLINIC CONSULT NOTE  Chief Complaint:  Follow-up dyslipidemia, dyspnea, snoring  Primary Care Physician: Sheliah Hatch, MD  Primary Cardiologist:  None  HPI:  Timothy Burnett is a 57 y.o. male who is being seen today for the evaluation of high triglycerides at the request of Beverely Low, Helane Rima, MD. this is a pleasant 57 year old male kindly referred for evaluation management of high triglycerides.  He reports a history of high cholesterol and a strong family history of high cholesterol and heart disease mostly in his mother who had previously had 13 stents.  She developed heart disease as early as age 62.  He has had both high triglycerides and elevated LDL cholesterol.  He is on rosuvastatin 40 mg daily.  He also takes fenofibrate 160 mg and 1500 mg fish oil daily.  His most recent labs showed total cholesterol 189, triglycerides 403, HDL 16.  And a direct LDL was measured at 127.  He also has a history of bipolar disorder and is on a couple of medications including olanzapine, medication that is known to increase cholesterol and triglycerides.  He denies any known cardiovascular disease.  He had previously been contacted about our clinical trial for triglyceride lowering in the past but had not been screened for that.  07/22/2022  [name] returns for follow-up.  He had repeat lipids with do show some improvement in his cholesterol.  His LDL particle number and LDL however remain elevated.  Particle #[id number] with an LDL-C 127, low HDL of 19 and triglycerides have improved to 261 where previously they were greater than 400.  That being said he is still not at target.  He had an elevated APO B817 and an LP(a) was elevated at 140.2 nmol/L.  He underwent coronary calcium scoring which predominantly showed calcium in the RCA but some in the LAD and circumflex as well.  His calcium score was 200, 85th percentile for age and sex matched controls.  Based on these findings he needs more  aggressive lipid-lowering to target an LDL less than 70.  In addition, he says he had some progressive shortness of breath and has been having particular shortness of breath at night where he is noted to wake up gasping for breath and his significant other says that he snores heavily.  PMHx:  Past Medical History:  Diagnosis Date   Bipolar disorder    Depression    Hyperlipemia    Ulcer    stomach    Past Surgical History:  Procedure Laterality Date   feet surgery     bone spurs   FOOT SURGERY  2012   [place]   TONSILLECTOMY      FAMHx:  Family History  Problem Relation Age of Onset   Hyperlipidemia Mother    Heart disease Mother    Hypertension Mother    Hyperlipidemia Father    Colon cancer Neg Hx    Colon polyps Neg Hx    Esophageal cancer Neg Hx    Rectal cancer Neg Hx    Stomach cancer Neg Hx     SOCHx:   reports that he has never smoked. He has never used smokeless tobacco. He reports that he does not drink alcohol and does not use drugs.  ALLERGIES:  Allergies  Allergen Reactions   Penicillins Rash    ROS: Pertinent items noted in HPI and remainder of comprehensive ROS otherwise negative.  HOME MEDS: Current Outpatient Medications on File Prior to Visit  Medication Sig Dispense  Refill   B Complex Vitamins (B COMPLEX PO) Take by mouth.     busPIRone (BUSPAR) 5 MG tablet Take 1 tablet daily at bedtime. 30 tablet 0   fenofibrate 160 MG tablet TAKE ONE TABLET BY MOUTH ONE TIME DAILY 30 tablet 0   icosapent Ethyl (VASCEPA) 1 g capsule Take 2 capsules (2 g total) by mouth 2 (two) times daily. 360 capsule 3   lamoTRIgine (LAMICTAL) 150 MG tablet Take 1 tablet (150 mg total) by mouth 2 (two) times daily. 180 tablet 0   Multiple Vitamin (MULTIVITAMIN) tablet Take 1 tablet by mouth daily.     OLANZapine (ZYPREXA) 15 MG tablet Take 1 tablet (15 mg total) by mouth at bedtime. 90 tablet 0   rosuvastatin (CRESTOR) 40 MG tablet TAKE ONE TABLET BY MOUTH ONE TIME DAILY 30  tablet 0   sertraline (ZOLOFT) 100 MG tablet Take 1.5 tablets (150 mg total) by mouth daily. 135 tablet 0   gabapentin (NEURONTIN) 100 MG capsule Take 1-2 capsules (100-200 mg total) by mouth at bedtime. (Patient not taking: Reported on 07/22/2022) 60 capsule 0   LORazepam (ATIVAN) 0.5 MG tablet Take 1 tablet (0.5 mg total) by mouth daily as needed for anxiety. (Patient not taking: Reported on 07/22/2022) 10 tablet 0   varenicline (CHANTIX CONTINUING MONTH PAK) 1 MG tablet Take 1 tablet (1 mg total) by mouth 2 (two) times daily. (Patient not taking: Reported on 07/22/2022) 60 tablet 2   No current facility-administered medications on file prior to visit.    LABS/IMAGING: No results found for this or any previous visit (from the past 48 hour(s)). No results found.  LIPID PANEL:    Component Value Date/Time   CHOL 189 08/21/2021 0854   TRIG (H) 08/21/2021 0854    403.0 Triglyceride is over 400; calculations on Lipids are invalid.   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   Mr. Hyun needs additional lipid lowering.  He is  already on high-dose statin, fish oil, fenofibrate and remains above target LDL less than 70.  He needs an additional 40 to 50% reduction and has a high LP(a).  He is a good candidate to add PCSK9 inhibitor therapy for and will reach out for prior authorization for this as he does have evidence of ASCVD with a calcium score of 200 which was significantly age advanced at 85th percentile.  In addition he has been having symptoms that could be ischemic in nature.  Has been short of breath and gets some symptoms with exertion that improves at rest.  Will  pursue a [employer].  Finally he reported some nocturnal awakenings, snoring and nonrestorative sleep.  There is witnessed apnea.  Based on this we will arrange for a home sleep study.  His stop-bang score is 6.  Plan follow-up with me afterwards.  Chrystie Nose, MD, [facility], FACP    [facility]  Medical Director of the [facility] Lipid [facility] &  Cardiovascular Risk Reduction Clinic Diplomate of the American Board of Clinical Lipidology Attending Cardiologist  Direct Dial: [phone]  Fax: [phone]  Website:  [email or web address] Nicely Brave Dack 07/22/2022, 11:00 AM

## 2022-07-23 ENCOUNTER — Encounter: Payer: Self-pay | Admitting: Internal Medicine

## 2022-07-23 ENCOUNTER — Telehealth: Payer: Self-pay | Admitting: Internal Medicine

## 2022-07-23 ENCOUNTER — Encounter (HOSPITAL_COMMUNITY): Payer: Self-pay

## 2022-07-23 NOTE — Telephone Encounter (Signed)
PA denied -- will need to contact insurance for rationale on determination/appeal steps

## 2022-07-23 NOTE — Telephone Encounter (Signed)
PA for repatha submitted via CMM (Key: [id number])

## 2022-07-24 ENCOUNTER — Telehealth: Payer: Self-pay | Admitting: *Deleted

## 2022-07-24 NOTE — Telephone Encounter (Signed)
Ok to activate itamar device. Patient's insurance does not require a PA. Message will be sent to ordering provider's nurse to have the patient to activate the device.

## 2022-07-25 MED ORDER — EZETIMIBE 10 MG PO TABS: 10.0000 mg | ORAL_TABLET | Freq: Every day | ORAL | 3 refills | Status: DC

## 2022-07-25 NOTE — Telephone Encounter (Signed)
Patient will need to try zetia 12 weeks per insurance - update sent to MD and patient

## 2022-07-25 NOTE — Telephone Encounter (Signed)
Denial letter under media  Reviewed by [facility] [place], [place], RPH-CPP  Lindell Spar, RN I downloaded the denial in his chart, looks like has to try ezetimibe for 12 weeks!

## 2022-07-27 ENCOUNTER — Encounter (INDEPENDENT_AMBULATORY_CARE_PROVIDER_SITE_OTHER): Payer: BC Managed Care – PPO | Admitting: Cardiology

## 2022-07-27 ENCOUNTER — Other Ambulatory Visit (HOSPITAL_COMMUNITY): Payer: Self-pay | Admitting: Psychiatry

## 2022-07-27 DIAGNOSIS — G4733 Obstructive sleep apnea (adult) (pediatric): Secondary | ICD-10-CM | POA: Diagnosis not present

## 2022-07-28 ENCOUNTER — Ambulatory Visit: Payer: BC Managed Care – PPO | Attending: Internal Medicine

## 2022-07-28 DIAGNOSIS — R0683 Snoring: Secondary | ICD-10-CM

## 2022-07-28 DIAGNOSIS — I480 Paroxysmal atrial fibrillation: Secondary | ICD-10-CM

## 2022-07-28 DIAGNOSIS — R400 Somnolence: Secondary | ICD-10-CM

## 2022-07-28 DIAGNOSIS — G4733 Obstructive sleep apnea (adult) (pediatric): Secondary | ICD-10-CM

## 2022-07-28 NOTE — Telephone Encounter (Signed)
Patient agreed to try zetia Documented in another message

## 2022-07-28 NOTE — Procedures (Signed)
SLEEP STUDY REPORT Patient Information Study Date: 07/27/2022 Patient Name: Timothy Burnett Patient ID: [id number] Birth Date: [birth year] Age: 57 Gender: Male BMI: 32.1 (W=211 lb, H=5' 8'') Referring Physician: K. [place] Hilty, MD  TEST DESCRIPTION: Home sleep apnea testing was completed using the WatchPat, a Type 1 device, utilizing peripheral arterial tonometry (PAT), chest movement, actigraphy, pulse oximetry, pulse rate, body position and snore.  AHI was calculated with apnea and hypopnea using valid sleep time as the denominator. RDI includes apneas, hypopneas, and RERAs.  The data acquired and the scoring of sleep and all associated events were performed in accordance with the recommended standards and specifications as outlined in the AASM Manual for the Scoring of Sleep and Associated Events 2.2.0 (2015).  FINDINGS:  1.  Severe Obstructive Sleep Apnea with AHI 49.1/hr.   2.  No significant Central Sleep Apnea with pAHIc 7.9/hr.  3.  Oxygen desaturations as low as 86%.  4.  Severe snoring was present. O2 sats were < 88% for 2.8 min.  5.  Total sleep time was 4 hrs and 57 min.  6.  20.3% of total sleep time was spent in REM sleep.   7.  Shortened sleep onset latency at 5 min.   8.  Prolonged REM sleep onset latency at 139 min.   9.  Total awakenings were 6.  10. Arrhythmia detection:  Suggestive of possible brief atrial fibrillation lasting 13 seconds.  This is not diagnostic and further testing with outpatient telemetry monitoring is recommended.  DIAGNOSIS:   Severe Obstructive Sleep Apnea (G47.33) Possible brief run of atrial fibrillation.  RECOMMENDATIONS:   1.  Clinical correlation of these findings is necessary.  The decision to treat obstructive sleep apnea (OSA) is usually based on the presence of apnea symptoms or the presence of associated medical conditions such as Hypertension, Congestive Heart Failure, Atrial Fibrillation or Obesity.  The most common symptoms of OSA  are snoring, gasping for breath while sleeping, daytime sleepiness and fatigue.   2.  Initiating apnea therapy is recommended given the presence of symptoms and/or associated conditions. Recommend proceeding with one of the following:     a.  Auto-CPAP therapy with a pressure range of 5-20cm H2O.     b.  An oral appliance (OA) that can be obtained from certain dentists with expertise in sleep medicine.  These are primarily of use in non-obese patients with mild and moderate disease.     c.  An ENT consultation which may be useful to look for specific causes of obstruction and possible treatment options.     d.  If patient is intolerant to PAP therapy, consider referral to ENT for evaluation for hypoglossal nerve stimulator.   3.  Close follow-up is necessary to ensure success with CPAP or oral appliance therapy for maximum benefit.  4.  A follow-up oximetry study on CPAP is recommended to assess the adequacy of therapy and determine the need for supplemental oxygen or the potential need for Bi-level therapy.  An arterial blood gas to determine the adequacy of baseline ventilation and oxygenation should also be considered.  5.  Healthy sleep recommendations include:  adequate nightly sleep (normal 7-9 hrs/night), avoidance of caffeine after noon and alcohol near bedtime, and maintaining a sleep environment that is cool, dark and quiet.  6.  Weight loss for overweight patients is recommended.  Even modest amounts of weight loss can significantly improve the severity of sleep apnea.  7.  Snoring recommendations include:  weight loss  where appropriate, side sleeping, and avoidance of alcohol before bed.  8.  Operation of motor vehicle should be avoided when sleepy.  Signature: Armanda Magic, MD; [facility]; Diplomat, American Board of Sleep Medicine Electronically Signed: 07/28/2022 2:31:23 PM

## 2022-07-28 NOTE — Progress Notes (Unsigned)
Enrolled for [employer] to mail a ZIO XT long term holter monitor to the patients address on file.  

## 2022-07-29 ENCOUNTER — Ambulatory Visit (HOSPITAL_COMMUNITY): Payer: BC Managed Care – PPO

## 2022-07-30 NOTE — Telephone Encounter (Signed)
Left detailed message per DPR regarding his STRESS TEST scheduled for 08/01/22 at 7:30.

## 2022-08-01 ENCOUNTER — Ambulatory Visit (HOSPITAL_COMMUNITY): Payer: BC Managed Care – PPO | Attending: Internal Medicine

## 2022-08-01 DIAGNOSIS — G4733 Obstructive sleep apnea (adult) (pediatric): Secondary | ICD-10-CM

## 2022-08-01 DIAGNOSIS — R0602 Shortness of breath: Secondary | ICD-10-CM | POA: Insufficient documentation

## 2022-08-01 DIAGNOSIS — I480 Paroxysmal atrial fibrillation: Secondary | ICD-10-CM | POA: Diagnosis not present

## 2022-08-01 LAB — MYOCARDIAL PERFUSION IMAGING
LV dias vol: 79 mL (ref 62–150)
LV sys vol: 32 mL
Nuc Stress EF: 59 %
Peak HR: 88 {beats}/min
Rest HR: 70 {beats}/min
Rest Nuclear Isotope Dose: 10.5 mCi
SDS: 0
SRS: 0
SSS: 0
ST Depression (mm): 0 mm
Stress Nuclear Isotope Dose: 32.7 mCi
TID: 1.03

## 2022-08-01 MED ORDER — REGADENOSON 0.4 MG/5ML IV SOLN
0.4000 mg | Freq: Once | INTRAVENOUS | Status: AC
Start: 2022-08-01 — End: 2022-08-01
  Administered 2022-08-01: 0.4 mg via INTRAVENOUS

## 2022-08-01 MED ORDER — TECHNETIUM TC 99M TETROFOSMIN IV KIT
10.5000 | PACK | Freq: Once | INTRAVENOUS | Status: AC | PRN
  Administered 2022-08-01: 10.5 via INTRAVENOUS

## 2022-08-01 MED ORDER — TECHNETIUM TC 99M TETROFOSMIN IV KIT
32.7000 | PACK | Freq: Once | INTRAVENOUS | Status: AC | PRN
  Administered 2022-08-01: 32.7 via INTRAVENOUS

## 2022-08-14 ENCOUNTER — Telehealth: Payer: Self-pay | Admitting: *Deleted

## 2022-08-14 DIAGNOSIS — G4733 Obstructive sleep apnea (adult) (pediatric): Secondary | ICD-10-CM

## 2022-08-14 DIAGNOSIS — R400 Somnolence: Secondary | ICD-10-CM

## 2022-08-14 DIAGNOSIS — R0683 Snoring: Secondary | ICD-10-CM

## 2022-08-14 NOTE — Addendum Note (Signed)
Addended by: Reesa Chew on: 08/14/2022 05:47 PM   Modules accepted: Orders

## 2022-08-14 NOTE — Telephone Encounter (Signed)
The patient has been notified of the result. Left detailed message on voicemail and informed patient to call back..Malyssa Maris Green, CMA   

## 2022-08-14 NOTE — Telephone Encounter (Signed)
-----   Message from Gaynelle Cage, CMA sent at 08/01/2022 10:56 AM EDT -----  ----- Message ----- From: Quintella Reichert, MD Sent: 07/28/2022   2:32 PM EDT To: Cv Div Sleep Studies  Please let patient know that they have sleep apnea.  Recommend therapeutic CPAP titration for treatment of patient's sleep disordered breathing.  If unable to perform an in lab titration then initiate ResMed auto CPAP from 4 to 15cm H2O with heated humidity and mask of choice and overnight pulse ox on CPAP.

## 2022-08-14 NOTE — Telephone Encounter (Signed)
The patient has been notified of the result and verbalized understanding.  All questions (if any) were answered. Latrelle Dodrill, CMA 08/14/2022 5:45 PM    Will precert titration

## 2022-08-26 ENCOUNTER — Ambulatory Visit: Payer: Managed Care, Other (non HMO) | Admitting: Family Medicine

## 2022-08-26 ENCOUNTER — Other Ambulatory Visit (HOSPITAL_COMMUNITY): Payer: Self-pay | Admitting: Psychiatry

## 2022-08-26 ENCOUNTER — Other Ambulatory Visit: Payer: Self-pay | Admitting: Family Medicine

## 2022-08-26 ENCOUNTER — Encounter: Payer: Self-pay | Admitting: Family Medicine

## 2022-08-26 ENCOUNTER — Ambulatory Visit: Payer: BC Managed Care – PPO | Admitting: Family Medicine

## 2022-08-26 VITALS — BP 118/80 | HR 78 | Temp 98.1°F | Resp 18 | Ht 68.0 in | Wt 203.5 lb

## 2022-08-26 DIAGNOSIS — Z0000 Encounter for general adult medical examination without abnormal findings: Secondary | ICD-10-CM

## 2022-08-26 DIAGNOSIS — E669 Obesity, unspecified: Secondary | ICD-10-CM

## 2022-08-26 DIAGNOSIS — F319 Bipolar disorder, unspecified: Secondary | ICD-10-CM | POA: Diagnosis not present

## 2022-08-26 DIAGNOSIS — F419 Anxiety disorder, unspecified: Secondary | ICD-10-CM

## 2022-08-26 DIAGNOSIS — E785 Hyperlipidemia, unspecified: Secondary | ICD-10-CM

## 2022-08-26 DIAGNOSIS — Z125 Encounter for screening for malignant neoplasm of prostate: Secondary | ICD-10-CM | POA: Diagnosis not present

## 2022-08-26 LAB — HEPATIC FUNCTION PANEL
ALT: 30 U/L (ref 0–53)
AST: 21 U/L (ref 0–37)
Albumin: 4.5 g/dL (ref 3.5–5.2)
Alkaline Phosphatase: 64 U/L (ref 39–117)
Bilirubin, Direct: 0.1 mg/dL (ref 0.0–0.3)
Total Bilirubin: 0.5 mg/dL (ref 0.2–1.2)
Total Protein: 6.8 g/dL (ref 6.0–8.3)

## 2022-08-26 LAB — CBC WITH DIFFERENTIAL/PLATELET
Basophils Absolute: 0 10*3/uL (ref 0.0–0.1)
Basophils Relative: 0.5 % (ref 0.0–3.0)
Eosinophils Absolute: 0.1 10*3/uL (ref 0.0–0.7)
Eosinophils Relative: 1.9 % (ref 0.0–5.0)
HCT: 39.2 % (ref 39.0–52.0)
Hemoglobin: 12.9 g/dL — ABNORMAL LOW (ref 13.0–17.0)
Lymphocytes Relative: 30.6 % (ref 12.0–46.0)
Lymphs Abs: 1.8 10*3/uL (ref 0.7–4.0)
MCHC: 32.8 g/dL (ref 30.0–36.0)
MCV: 92.2 fl (ref 78.0–100.0)
Monocytes Absolute: 0.5 10*3/uL (ref 0.1–1.0)
Monocytes Relative: 8.3 % (ref 3.0–12.0)
Neutro Abs: 3.4 10*3/uL (ref 1.4–7.7)
Neutrophils Relative %: 58.7 % (ref 43.0–77.0)
Platelets: 246 10*3/uL (ref 150.0–400.0)
RBC: 4.25 Mil/uL (ref 4.22–5.81)
RDW: 14.8 % (ref 11.5–15.5)
WBC: 5.8 10*3/uL (ref 4.0–10.5)

## 2022-08-26 LAB — LIPID PANEL
Cholesterol: 122 mg/dL (ref 0–200)
HDL: 13 mg/dL — ABNORMAL LOW (ref >39.00)
NonHDL: 109
Total CHOL/HDL Ratio: 9
Triglycerides: 237 mg/dL — ABNORMAL HIGH (ref 0.0–149.0)
VLDL: 47.4 mg/dL — ABNORMAL HIGH (ref 0.0–40.0)

## 2022-08-26 LAB — PSA: PSA: 0.54 ng/mL (ref 0.10–4.00)

## 2022-08-26 LAB — BASIC METABOLIC PANEL
BUN: 23 mg/dL (ref 6–23)
CO2: 25 mEq/L (ref 19–32)
Calcium: 9.5 mg/dL (ref 8.4–10.5)
Chloride: 103 mEq/L (ref 96–112)
Creatinine, Ser: 1.11 mg/dL (ref 0.40–1.50)
GFR: 73.62 mL/min (ref >60.00)
Glucose, Bld: 88 mg/dL (ref 70–99)
Potassium: 4.5 mEq/L (ref 3.5–5.1)
Sodium: 138 mEq/L (ref 135–145)

## 2022-08-26 LAB — LDL CHOLESTEROL, DIRECT: Direct LDL: 75 mg/dL

## 2022-08-26 LAB — TSH: TSH: 4.25 u[IU]/mL (ref 0.35–5.50)

## 2022-08-26 NOTE — Assessment & Plan Note (Signed)
Pt's PE WNL w/ exception of BMI.  UTD on colonoscopy, Tdap.  He quit smoking 10 months ago!  Encouraged healthy diet and regular exercise.  Check labs.  Anticipatory guidance provided.

## 2022-08-26 NOTE — Assessment & Plan Note (Signed)
Following w/ Dr Lolly Mustache.  Has appt upcoming and they plan to discuss medication changes.

## 2022-08-26 NOTE — Assessment & Plan Note (Signed)
Ongoing issue for pt.  BMI 30.94  Encouraged healthy diet and regular exercise.  Check labs to risk stratify.  Will follow.

## 2022-08-26 NOTE — Patient Instructions (Signed)
Follow up in 1 year or as needed We'll notify you of your lab results and make any changes if needed Keep up the good work on healthy diet and regular exercise- you can do it!! Call with any questions or concerns Stay Safe!  Stay Healthy! [place]!!!

## 2022-08-26 NOTE — Progress Notes (Signed)
   Subjective:    Patient ID: Timothy Burnett, male    DOB: 1965-06-24, 57 y.o.   MRN: [id number]  HPI CPE- UTD on colonoscopy, Tdap.  Pt quit smoking 10 months ago  Patient Care Team    Relationship Specialty Notifications Start End  Sheliah Hatch, MD PCP - General Family Medicine  06/11/12   Cleotis Nipper, MD Consulting Physician Psychiatry  01/03/15   Beverley Fiedler, MD Consulting Physician Gastroenterology  06/20/20      Health Maintenance  Topic Date Due   Zoster Vaccines- Shingrix (1 of 2) 11/26/2022 (Originally 12/21/2014)   INFLUENZA VACCINE  10/30/2022   DTaP/Tdap/Td (2 - Td or Tdap) 01/03/2023   Colonoscopy  01/18/2026   HPV VACCINES  Aged Out   COVID-19 Vaccine  Discontinued   Hepatitis C Screening  Discontinued   HIV Screening  Discontinued      Review of Systems Patient reports no vision/hearing changes, anorexia, fever ,adenopathy, persistant/recurrent hoarseness, swallowing issues, chest pain, palpitations, edema, persistant/recurrent cough, hemoptysis, gastrointestinal  bleeding (melena, rectal bleeding), abdominal pain, excessive heart burn, GU symptoms (dysuria, hematuria, voiding/incontinence issues) syncope, focal weakness, memory loss, numbness & tingling, skin/hair/nail changes, depression, anxiety, abnormal bruising/bleeding, musculoskeletal symptoms/signs.   + SOB occurs when talking, he feels like he 'runs out of air'.  Does not occur w/ exercise.    Objective:   Physical Exam General Appearance:    Alert, cooperative, no distress, appears stated age  Head:    Normocephalic, without obvious abnormality, atraumatic  Eyes:    PERRL, conjunctiva/corneas clear, EOM's intact both eyes       Ears:    Normal TM's and external ear canals, both ears  Nose:   Nares normal, septum midline, mucosa normal, no drainage   or sinus tenderness  Throat:   Lips, mucosa, and tongue normal; teeth and gums normal  Neck:   Supple, symmetrical, trachea midline, no adenopathy;        thyroid:  No enlargement/tenderness/nodules  Back:     Symmetric, no curvature, ROM normal, no CVA tenderness  Lungs:     Clear to auscultation bilaterally, respirations unlabored  Chest wall:    No tenderness or deformity  Heart:    Regular rate and rhythm, S1 and S2 normal, no murmur, rub   or gallop  Abdomen:     Soft, non-tender, bowel sounds active all four quadrants,    no masses, no organomegaly  Genitalia:    deferred  Rectal:    Extremities:   Extremities normal, atraumatic, no cyanosis or edema  Pulses:   2+ and symmetric all extremities  Skin:   Skin color, texture, turgor normal, no rashes or lesions  Lymph nodes:   Cervical, supraclavicular, and axillary nodes normal  Neurologic:   CNII-XII intact. Normal strength, sensation and reflexes      throughout          Assessment & Plan:

## 2022-08-27 ENCOUNTER — Telehealth: Payer: Self-pay

## 2022-08-27 NOTE — Telephone Encounter (Signed)
Pt aware of lab results 

## 2022-08-27 NOTE — Telephone Encounter (Signed)
-----   Message from Sheliah Hatch, MD sent at 08/26/2022  8:34 PM EDT ----- Labs are stable.  No changes at this time

## 2022-08-28 ENCOUNTER — Encounter (HOSPITAL_COMMUNITY): Payer: Self-pay | Admitting: Psychiatry

## 2022-08-28 ENCOUNTER — Telehealth (HOSPITAL_BASED_OUTPATIENT_CLINIC_OR_DEPARTMENT_OTHER): Payer: BC Managed Care – PPO | Admitting: Psychiatry

## 2022-08-28 DIAGNOSIS — F419 Anxiety disorder, unspecified: Secondary | ICD-10-CM | POA: Diagnosis not present

## 2022-08-28 DIAGNOSIS — F319 Bipolar disorder, unspecified: Secondary | ICD-10-CM

## 2022-08-28 MED ORDER — LAMOTRIGINE 150 MG PO TABS: 150.0000 mg | ORAL_TABLET | Freq: Two times a day (BID) | ORAL | 0 refills | Status: DC

## 2022-08-28 MED ORDER — SERTRALINE HCL 100 MG PO TABS: 150.0000 mg | ORAL_TABLET | Freq: Every day | ORAL | 0 refills | Status: DC

## 2022-08-28 MED ORDER — OLANZAPINE 15 MG PO TABS
15.0000 mg | ORAL_TABLET | Freq: Every day | ORAL | 0 refills | Status: DC
Start: 2022-08-28 — End: 2022-11-27

## 2022-08-28 NOTE — Progress Notes (Signed)
[facility] MD Virtual Progress Note   Patient Location: Work Provider Location: Office  I connect with patient by video and verified that I am speaking with correct person by using two identifiers. I discussed the limitations of evaluation and management by telemedicine and the availability of in person appointments. I also discussed with the patient that there may be a patient responsible charge related to this service. The patient expressed understanding and agreed to proceed.  Timothy Burnett [id number] 57 y.o.  08/28/2022 3:27 PM  History of Present Illness:  Patient is evaluated by video session.  He continued to endorse insomnia and not feeling rested in the morning.  We had tried multiple medication and recently tried gabapentin which he developed side effects with complaint of numbness.  We recommend to try BuSpar but now he had a sleep study and find out that he has obstructive apnea.  He is waiting for the CPAP machine.  He stopped taking BuSpar because he like to take the CPAP treatment.  He also noticed not much have luck with the BuSpar because he was still struggling with sleep.  Overall he feels his mood irritability anger is better.  He is moved back to [employer] in [place] and he likes it.  He is excited about upcoming trip to beach and of this month.  He has no tremors or shakes or any EPS.  He denies any mania or any psychosis.  Recently had a visit with his cardiology and primary care.  He had blood work.  His triglycerides are still high but cholesterol, LDL are improved from the past.  His blood sugar is also normal.  He has no tremor or shakes or any EPS.  His anxiety is somewhat better.  Past Psychiatric History: H/O mania and impulsive behavior.  No h/o psychiatric inpatient treatment or suicidal attempt.  Tried Abilify and Geodon with poor outcome. Vistaril made groggy. Trazodone caused dreams, Melatonin did not work.    Outpatient Encounter Medications as of  08/28/2022  Medication Sig   B Complex Vitamins (B COMPLEX PO) Take by mouth.   busPIRone (BUSPAR) 5 MG tablet TAKE ONE TABLET BY MOUTH ONE TIME DAILY AT BEDTIME   ezetimibe (ZETIA) 10 MG tablet Take 1 tablet (10 mg total) by mouth daily.   fenofibrate 160 MG tablet TAKE ONE TABLET BY MOUTH ONE TIME DAILY   lamoTRIgine (LAMICTAL) 150 MG tablet Take 1 tablet (150 mg total) by mouth 2 (two) times daily.   Multiple Vitamin (MULTIVITAMIN) tablet Take 1 tablet by mouth daily.   OLANZapine (ZYPREXA) 15 MG tablet Take 1 tablet (15 mg total) by mouth at bedtime.   rosuvastatin (CRESTOR) 40 MG tablet TAKE ONE TABLET BY MOUTH ONE TIME DAILY   sertraline (ZOLOFT) 100 MG tablet Take 1.5 tablets (150 mg total) by mouth daily.   No facility-administered encounter medications on file as of 08/28/2022.    Recent Results (from the past 2160 hour(s))  NMR, lipoprofile     Status: Abnormal   Collection Time: 07/11/22  8:33 AM  Result Value Ref Range   LDL Particle Number [id number] (H) <1,000 nmol/L    Comment:                           Low                   < 1000  Moderate         1000 - 1299                           Borderline-High  1300 - 1599                           High             1600 - 2000                           Very High             > 2000    LDL-C (NIH Calc) 127 (H) 0 - 99 mg/dL    Comment:                           Optimal               <  100                           Above optimal     100 -  129                           Borderline        130 -  159                           High              160 -  189                           Very high             >  189    HDL-C 19 (L) >39 mg/dL   Triglycerides 540 (H) 0 - 149 mg/dL   Cholesterol, Total 981 100 - 199 mg/dL   HDL Particle Number 19.1 (L) >=30.5 umol/L   Small LDL Particle Number [id number] (H) <=527 nmol/L   LDL Size 20.4 (L) >20.5 nm    Comment:  ----------------------------------------------------------                   ** INTERPRETATIVE INFORMATION**                  PARTICLE CONCENTRATION AND SIZE                     <--Lower CVD Risk   Higher CVD Risk-->   LDL AND HDL PARTICLES   Percentile in Reference Population   HDL-P (total)        High     75th    50th    25th   Low                        >34.9    34.9    30.5    26.7   <26.7   Small LDL-P          Low      25th    50th    75th   High                        <  117     117     527     839    >839   LDL Size   <-Large (Pattern A)->    <-Small (Pattern B)->                     23.0    20.6           20.5      19.0  ---------------------------------------------------------- Small LDL-P and LDL Size are associated with CVD risk, but not after LDL-P is taken into account.    LP-IR Score 91 (H) <=45    Comment: INSULIN RESISTANCE MARKER     <--Insulin Sensitive    Insulin Resistant-->            Percentile in Reference Population Insulin Resistance Score LP-IR Score   Low   25th   50th   75th   High               <27   27     45     63     >63 LP-IR Score is inaccurate if patient is non-fasting. The LP-IR score is a laboratory developed index that has been associated with insulin resistance and diabetes risk and should be used as one component of a physician's clinical assessment.   Lipoprotein A (LPA)     Status: Abnormal   Collection Time: 07/11/22  8:33 AM  Result Value Ref Range   Lipoprotein (a) 140.2 (H) <75.0 nmol/L    Comment: Note:  Values greater than or equal to 75.0 nmol/L may        indicate an independent risk factor for CHD,        but must be evaluated with caution when applied        to non-Caucasian populations due to the        influence of genetic factors on Lp(a) across        ethnicities.   Apolipoprotein B     Status: Abnormal   Collection Time: 07/11/22  8:33 AM  Result Value Ref Range   Apolipoprotein B 117 (H) <90 mg/dL    Comment:                          Desirable               < 90                           Borderline High     90 -  99                          High               100 - 130                          Very High               >130      --------------------------------------------------           ASCVD RISK              THERAPEUTIC TARGET            CATEGORY  APO B (mg/dL)         Very High Risk        <80 (if extreme risk <70)         High Risk             <90         Moderate Risk         <90   MYOCARDIAL PERFUSION IMAGING     Status: None   Collection Time: 08/01/22 12:48 PM  Result Value Ref Range   Rest Nuclear Isotope Dose 10.5 mCi   Stress Nuclear Isotope Dose 32.7 mCi   Rest HR 70.0 bpm   Rest BP 134/74 mmHg   Peak HR 88 bpm   Peak BP 143/61 mmHg   SSS 0.0    SRS 0.0    SDS 0.0    TID 1.03    LV sys vol 32.0 mL   LV dias vol 79.0 62 - 150 mL   Nuc Stress EF 59 %   ST Depression (mm) 0 mm  Lipid panel     Status: Abnormal   Collection Time: 08/26/22  8:44 AM  Result Value Ref Range   Cholesterol 122 0 - 200 mg/dL    Comment: ATP III Classification       Desirable:  < 200 mg/dL               Borderline High:  200 - 239 mg/dL          High:  > = 098 mg/dL   Triglycerides 119.1 (H) 0.0 - 149.0 mg/dL    Comment: Normal:  <478 mg/dLBorderline High:  150 - 199 mg/dL   HDL 29.56 (L) >21.30 mg/dL   VLDL 86.5 (H) 0.0 - 78.4 mg/dL   Total CHOL/HDL Ratio 9     Comment:                Men          Women1/2 Average Risk     3.4          3.3Average Risk          5.0          4.42X Average Risk          9.6          7.13X Average Risk          15.0          11.0                       NonHDL 109.00     Comment: NOTE:  Non-HDL goal should be 30 mg/dL higher than patient's LDL goal (i.e. LDL goal of < 70 mg/dL, would have non-HDL goal of < 100 mg/dL)  Basic metabolic panel     Status: None   Collection Time: 08/26/22  8:44 AM  Result Value Ref Range   Sodium 138 135 - 145 mEq/L   Potassium 4.5 3.5 - 5.1 mEq/L   Chloride 103 96 - 112 mEq/L   CO2 25 19 -  32 mEq/L   Glucose, Bld 88 70 - 99 mg/dL   BUN 23 6 - 23 mg/dL   Creatinine, Ser 6.96 0.40 - 1.50 mg/dL   GFR 29.52 >84.13 mL/min    Comment: Calculated using the CKD-EPI Creatinine Equation (2021)   Calcium 9.5 8.4 - 10.5 mg/dL  TSH     Status: None   Collection Time:  08/26/22  8:44 AM  Result Value Ref Range   TSH 4.25 0.35 - 5.50 uIU/mL  Hepatic function panel     Status: None   Collection Time: 08/26/22  8:44 AM  Result Value Ref Range   Total Bilirubin 0.5 0.2 - 1.2 mg/dL   Bilirubin, Direct 0.1 0.0 - 0.3 mg/dL   Alkaline Phosphatase 64 39 - 117 U/L   AST 21 0 - 37 U/L   ALT 30 0 - 53 U/L   Total Protein 6.8 6.0 - 8.3 g/dL   Albumin 4.5 3.5 - 5.2 g/dL  CBC with Differential/Platelet     Status: Abnormal   Collection Time: 08/26/22  8:44 AM  Result Value Ref Range   WBC 5.8 4.0 - 10.5 K/uL   RBC 4.25 4.22 - 5.81 Mil/uL   Hemoglobin 12.9 (L) 13.0 - 17.0 g/dL   HCT 08.6 57.8 - 46.9 %   MCV 92.2 78.0 - 100.0 fl   MCHC 32.8 30.0 - 36.0 g/dL   RDW 62.9 52.8 - 41.3 %   Platelets 246.0 150.0 - 400.0 K/uL   Neutrophils Relative % 58.7 43.0 - 77.0 %   Lymphocytes Relative 30.6 12.0 - 46.0 %   Monocytes Relative 8.3 3.0 - 12.0 %   Eosinophils Relative 1.9 0.0 - 5.0 %   Basophils Relative 0.5 0.0 - 3.0 %   Neutro Abs 3.4 1.4 - 7.7 K/uL   Lymphs Abs 1.8 0.7 - 4.0 K/uL   Monocytes Absolute 0.5 0.1 - 1.0 K/uL   Eosinophils Absolute 0.1 0.0 - 0.7 K/uL   Basophils Absolute 0.0 0.0 - 0.1 K/uL  PSA     Status: None   Collection Time: 08/26/22  8:44 AM  Result Value Ref Range   PSA 0.54 0.10 - 4.00 ng/mL    Comment: Test performed using Access Hybritech PSA Assay, a parmagnetic partical, chemiluminecent immunoassay.  LDL cholesterol, direct     Status: None   Collection Time: 08/26/22  8:44 AM  Result Value Ref Range   Direct LDL 75.0 mg/dL    Comment: Optimal:  <244 mg/dLNear or Above Optimal:  100-129 mg/dLBorderline High:  130-159 mg/dLHigh:  160-189 mg/dLVery High:  >190 mg/dL      Psychiatric Specialty Exam: Physical Exam  Review of Systems  Weight 203 lb (92.1 kg).There is no height or weight on file to calculate BMI.  General Appearance: Casual  Eye Contact:  Good  Speech:  Clear and Coherent  Volume:  Normal  Mood:  Euthymic  Affect:  Appropriate  Thought Process:  Goal Directed  Orientation:  Full (Time, Place, and Person)  Thought Content:  Logical  Suicidal Thoughts:  No  Homicidal Thoughts:  No  Memory:  Immediate;   Good Recent;   Good Remote;   Good  Judgement:  Intact  Insight:  Present  Psychomotor Activity:  Normal  Concentration:  Concentration: Good and Attention Span: Good  Recall:  Good  Fund of Knowledge:  Good  Language:  Good  Akathisia:  No  Handed:  Right  AIMS (if indicated):     Assets:  Communication Skills Desire for Improvement Housing Resilience Social Support Talents/Skills Transportation  ADL's:  Intact  Cognition:  WNL  Sleep:  fair     Assessment/Plan: Bipolar 1 disorder (HCC) - Plan: lamoTRIgine (LAMICTAL) 150 MG tablet, OLANZapine (ZYPREXA) 15 MG tablet, sertraline (ZOLOFT) 100 MG tablet  Anxiety - Plan: sertraline (ZOLOFT) 100 MG tablet  Patient has sleep study and diagnosed with apnea  and he is waiting for CPAP machine.  I reviewed blood work results.  His triglycerides still high but all other labs are better than before.  Recommend to discontinue BuSpar as he had a sleep study and given the diagnosis of apnea.  He had tried multiple medication in the past which did not work for his sleep.  Gabapentin caused numbness and BuSpar did not help.  Patient is comfortable taking his current medication as he noticed job is less stressful and manageable.  Continue olanzapine 15 mg at bedtime, Lamictal 300 mg daily and Zoloft 150 mg daily.  Recommend to call us back if is any question or any concern.  Follow-up in 3 months.   Follow Up Instructions:     I discussed the assessment and treatment plan with the  patient. The patient was provided an opportunity to ask questions and all were answered. The patient agreed with the plan and demonstrated an understanding of the instructions.   The patient was advised to call back or seek an in-person evaluation if the symptoms worsen or if the condition fails to improve as anticipated.    Collaboration of Care: Other provider involved in patient's care AEB notes are available in epic to review.  Patient/Guardian was advised Release of Information must be obtained prior to any record release in order to collaborate their care with an outside provider. Patient/Guardian was advised if they have not already done so to contact the registration department to sign all necessary forms in order for [place] to release information regarding their care.   Consent: Patient/Guardian gives verbal consent for treatment and assignment of benefits for services provided during this visit. Patient/Guardian expressed understanding and agreed to proceed.     I provided 23 minutes of non face to face time during this encounter.  Note: This document was prepared by [employer] voice dictation technology and any errors that results from this process are unintentional.    Cleotis Nipper, MD 08/28/2022

## 2022-09-15 NOTE — Telephone Encounter (Signed)
Prior Authorization for titration sent to [facility] via web portal. Tracking Number . [id number]/17 READY-APPROVED-AUTH# [id number] DATES 08/29/22--11/29/22

## 2022-09-24 ENCOUNTER — Other Ambulatory Visit: Payer: Self-pay | Admitting: Family Medicine

## 2022-09-24 DIAGNOSIS — E785 Hyperlipidemia, unspecified: Secondary | ICD-10-CM

## 2022-10-15 ENCOUNTER — Ambulatory Visit (HOSPITAL_BASED_OUTPATIENT_CLINIC_OR_DEPARTMENT_OTHER): Payer: BC Managed Care – PPO | Attending: Cardiology | Admitting: Cardiology

## 2022-10-15 VITALS — Ht 68.0 in | Wt 210.0 lb

## 2022-10-15 DIAGNOSIS — I493 Ventricular premature depolarization: Secondary | ICD-10-CM | POA: Diagnosis not present

## 2022-10-15 DIAGNOSIS — R0683 Snoring: Secondary | ICD-10-CM

## 2022-10-15 DIAGNOSIS — G4733 Obstructive sleep apnea (adult) (pediatric): Secondary | ICD-10-CM | POA: Insufficient documentation

## 2022-10-15 DIAGNOSIS — G4731 Primary central sleep apnea: Secondary | ICD-10-CM | POA: Diagnosis not present

## 2022-10-15 DIAGNOSIS — R400 Somnolence: Secondary | ICD-10-CM | POA: Diagnosis present

## 2022-10-17 NOTE — Procedures (Signed)
   Patient Name: Timothy Burnett, Timothy Burnett Date: 10/15/2022 Gender: Male D.O.B: 1965-10-12 Age (years): 78 Referring Provider: Armanda Magic MD, ABSM Height (inches): 68 Interpreting Physician: Armanda Magic MD, ABSM Weight (lbs): 210 RPSGT: Shelah Lewandowsky BMI: 32 MRN: [id number] Neck Size: 16.50  CLINICAL INFORMATION The patient is referred for a BiPAP titration to treat sleep apnea.  SLEEP STUDY TECHNIQUE As per the AASM Manual for the Scoring of Sleep and Associated Events v2.3 (April 2016) with a hypopnea requiring 4% desaturations.  The channels recorded and monitored were frontal, central and occipital EEG, electrooculogram (EOG), submentalis EMG (chin), nasal and oral airflow, thoracic and abdominal wall motion, anterior tibialis EMG, snore microphone, electrocardiogram, and pulse oximetry. Bilevel positive airway pressure (BPAP) was initiated at the beginning of the study and titrated to treat sleep-disordered breathing.  MEDICATIONS Medications self-administered by patient taken the night of the study : EZETIMIBE, FENOFIBRATE, LAMOTRIGINE, OLANZAPINE, ROSUVASTATIN, SERTRALINE  RESPIRATORY PARAMETERS Optimal IPAP Pressure (cm): N/A  AHI at Optimal Pressure (/hr) N/A Optimal EPAP Pressure (cm):N/A Overall Minimal O2 (%):N/A  Minimal O2 at Optimal Pressure (%): N/A  SLEEP ARCHITECTURE Start Time:9:35:03 PM  Stop Time:4:33:47 AM  Total Time (min):418.7  Total Sleep Time (min):375.6 Sleep Latency (min):17.1  Sleep Efficiency (%):89.7%  REM Latency (min):334.5  WASO (min):26.0 Stage N1 (%):20.0%  Stage N2 (%):73.8%  Stage N3 (%):0.0%  Stage R (%):6.3 Supine (%):92.86  Arousal Index (/hr):71.9   CARDIAC DATA The 2 lead EKG demonstrated sinus rhythm. The mean heart rate was 72.4 beats per minute. Other EKG findings include: PVCs.  LEG MOVEMENT DATA The total Periodic Limb Movements of Sleep (PLMS) were 0. The PLMS index was 0.0. A PLMS index of <15 is considered normal in  adults.  IMPRESSIONS - An optimal PAP pressure could not be selected for this patient due to ongoing respiratory events. - Moderate Central Sleep Apnea was noted during this titration (CAI = 20.1/h). - Mild oxygen desaturations were observed during this titration (min O2 = 87.0%). - No snoring was audible during this study. - 2-lead EKG demonstrated: PVCs - Clinically significant periodic limb movements were not noted during this study. Arousals associated with PLMs were rare.  DIAGNOSIS - Obstructive Sleep Apnea (G47 .33) - Unsuccessful BiPAP titration RECOMMENDATIONS - Referral to Pulmonary Sleep Medicine for further evaluation - Avoid alcohol, sedatives and other CNS depressants that may worsen sleep apnea and disrupt normal sleep architecture. - Sleep hygiene should be reviewed to assess factors that may improve sleep quality. - Weight management and regular exercise should be initiated or continued.  [Electronically signed] 10/24/2022 01:49 PM  Armanda Magic MD, ABSM Diplomate, American Board of Sleep Medicine

## 2022-10-22 ENCOUNTER — Ambulatory Visit: Payer: BC Managed Care – PPO | Attending: Internal Medicine | Admitting: Internal Medicine

## 2022-10-22 VITALS — BP 110/64 | HR 81 | Ht 68.0 in | Wt 209.0 lb

## 2022-10-22 DIAGNOSIS — E785 Hyperlipidemia, unspecified: Secondary | ICD-10-CM | POA: Diagnosis not present

## 2022-10-22 DIAGNOSIS — E783 Hyperchylomicronemia: Secondary | ICD-10-CM | POA: Diagnosis not present

## 2022-10-22 DIAGNOSIS — R931 Abnormal findings on diagnostic imaging of heart and coronary circulation: Secondary | ICD-10-CM | POA: Diagnosis not present

## 2022-10-22 DIAGNOSIS — E7841 Elevated Lipoprotein(a): Secondary | ICD-10-CM | POA: Diagnosis not present

## 2022-10-22 NOTE — Progress Notes (Signed)
LIPID CLINIC CONSULT NOTE  Chief Complaint:  Follow-up dyslipidemia, dyspnea, snoring  Primary Care Physician: Timothy Hatch, MD  Primary Cardiologist:  None  HPI:  Timothy Burnett is a 57 y.o. male who is being seen today for the evaluation of high triglycerides at the request of Timothy Burnett, Helane Rima, MD. this is a pleasant 57 year old male kindly referred for evaluation management of high triglycerides.  He reports a history of high cholesterol and a strong family history of high cholesterol and heart disease mostly in his mother who had previously had 13 stents.  She developed heart disease as early as age 28.  He has had both high triglycerides and elevated LDL cholesterol.  He is on rosuvastatin 40 mg daily.  He also takes fenofibrate 160 mg and 1500 mg fish oil daily.  His most recent labs showed total cholesterol 189, triglycerides 403, HDL 16.  And a direct LDL was measured at 127.  He also has a history of bipolar disorder and is on a couple of medications including olanzapine, medication that is known to increase cholesterol and triglycerides.  He denies any known cardiovascular disease.  He had previously been contacted about our clinical trial for triglyceride lowering in the past but had not been screened for that.  07/22/2022  Mr. Timothy Burnett returns for follow-up.  He had repeat lipids with do show some improvement in his cholesterol.  His LDL particle number and LDL however remain elevated.  Particle #[id number] with an LDL-C 127, Burnett HDL of 19 and triglycerides have improved to 261 where previously they were greater than 400.  That being said he is still not at target.  He had an elevated APO B817 and an LP(a) was elevated at 140.2 nmol/L.  He underwent coronary calcium scoring which predominantly showed calcium in the RCA but some in the LAD and circumflex as well.  His calcium score was 200, 85th percentile for age and sex matched controls.  Based on these findings he needs more  aggressive lipid-lowering to target an LDL less than 70.  In addition, he says he had some progressive shortness of breath and has been having particular shortness of breath at night where he is noted to wake up gasping for breath and his significant other says that he snores heavily.  10/22/2022  Mr. Timothy Burnett is seen today in follow-up.  He had recent repeat lipids which do show improvement in his overall cholesterol with total 122, HDL 13, LDL 75 (measured directly) and triglycerides 237.  This is with the recent addition of ezetimibe.  Unfortunately his LDL is still not at target less than 70.  We had initially recommended a PCSK9 inhibitor as it was unlikely that ezetimibe would reach his target however his insurance company recommended that he be on ezetimibe at least for 3 months (which he has been) prior to considering PCSK9 inhibitor therapy.  PMHx:  Past Medical History:  Diagnosis Date   Bipolar disorder (HCC)    Depression    Hyperlipemia    Ulcer    stomach    Past Surgical History:  Procedure Laterality Date   feet surgery     bone spurs   FOOT SURGERY  2012   Bil   TONSILLECTOMY      FAMHx:  Family History  Problem Relation Age of Onset   Hyperlipidemia Mother    Heart disease Mother    Hypertension Mother    Hyperlipidemia Father    Colon cancer Neg Hx  Colon polyps Neg Hx    Esophageal cancer Neg Hx    Rectal cancer Neg Hx    Stomach cancer Neg Hx     SOCHx:   reports that he has never smoked. He has never used smokeless tobacco. He reports that he does not drink alcohol and does not use drugs.  ALLERGIES:  Allergies  Allergen Reactions   Penicillins Rash    ROS: Pertinent items noted in HPI and remainder of comprehensive ROS otherwise negative.  HOME MEDS: Current Outpatient Medications on File Prior to Visit  Medication Sig Dispense Refill   B Complex Vitamins (B COMPLEX PO) Take by mouth.     busPIRone (BUSPAR) 5 MG tablet TAKE ONE TABLET BY  MOUTH ONE TIME DAILY AT BEDTIME 30 tablet 0   ezetimibe (ZETIA) 10 MG tablet Take 1 tablet (10 mg total) by mouth daily. 90 tablet 3   fenofibrate 160 MG tablet TAKE ONE TABLET BY MOUTH ONE TIME DAILY 30 tablet 0   lamoTRIgine (LAMICTAL) 150 MG tablet Take 1 tablet (150 mg total) by mouth 2 (two) times daily. 180 tablet 0   Multiple Vitamin (MULTIVITAMIN) tablet Take 1 tablet by mouth daily.     OLANZapine (ZYPREXA) 15 MG tablet Take 1 tablet (15 mg total) by mouth at bedtime. 90 tablet 0   Omega-3 Fatty Acids (FISH OIL) 1200 MG CAPS Take 2 capsules by mouth daily.     rosuvastatin (CRESTOR) 40 MG tablet TAKE ONE TABLET BY MOUTH ONE TIME DAILY 30 tablet 0   sertraline (ZOLOFT) 100 MG tablet Take 1.5 tablets (150 mg total) by mouth daily. 135 tablet 0   No current facility-administered medications on file prior to visit.    LABS/IMAGING: No results found for this or any previous visit (from the past 48 hour(s)). No results found.  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   Mr. Timothy Burnett has had further reduction in his cholesterol on ezetimibe which he has been on for more than 3 months however his LDL is still above target less than 70.  He will need additional therapy to reach target and at this point since he is on combination therapy of ezetimibe and high-dose 40 mg rosuvastatin as well as fenofibrate and fish oil, I would recommend a PCSK9 inhibitor.  We will again reach out for prior authorization for this.  Plan follow-up with me with repeat lipids in about 3 to 4 months.  Chrystie Nose,  MD, [facility]ag[facility] Loll  [facility] [employer]  [facility]  Medical Director of the [facility]id [facility]  Cardiovascular Risk Reduction Clinic Diplomate of the American Board of Clinical Lipidology Attending Cardiologist  Direct Dial: [phone]  Fax: [phone]  Website:  [email or web address] 10/22/2022, 2:34 PM

## 2022-10-22 NOTE — Patient Instructions (Signed)
Medication Instructions:   Your physician recommends that you continue on your current medications as directed. Please refer to the Current Medication list given to you today.  A message was sent to the pharmacy team.  Someone will be in contact with you about the PCSK9.  *If you need a refill on your cardiac medications before your next appointment, please call your pharmacy*   Lab Work:  None ordered.   If you have labs (blood work) drawn today and your tests are completely normal, you will receive your results only by: MyChart Message (if you have MyChart) OR A paper copy in the mail If you have any lab test that is abnormal or we need to change your treatment, we will call you to review the results.   Testing/Procedures:  None ordered.    Follow-Up: At [facility], you and your health needs are our priority.  As part of our continuing mission to provide you with exceptional heart care, we have created designated Provider Care Teams.  These Care Teams include your primary Cardiologist (physician) and Advanced Practice Providers (APPs -  Physician Assistants and Nurse Practitioners) who all work together to provide you with the care you need, when you need it.  We recommend signing up for the patient portal called "MyChart".  Sign up information is provided on this After Visit Summary.  MyChart is used to connect with patients for Virtual Visits (Telemedicine).  Patients are able to view lab/test results, encounter notes, upcoming appointments, etc.  Non-urgent messages can be sent to your provider as well.   To learn more about what you can do with MyChart, go to [email or web address].    Your next appointment:   6 month(s)  Provider:   Italy Hilty, MD     Other Instructions  A message was sent to the Sleep team.  You should be hearing from someone about ordering your equipment for Sleep Apnea.   Your physician wants you to follow-up in: 6 months with Dr. Rennis Golden.   You will receive a reminder letter in the mail two months in advance. If you don't receive a letter, please call our office to schedule the follow-up appointment.

## 2022-10-24 ENCOUNTER — Encounter: Payer: Self-pay | Admitting: Internal Medicine

## 2022-10-24 DIAGNOSIS — G4733 Obstructive sleep apnea (adult) (pediatric): Secondary | ICD-10-CM

## 2022-10-25 ENCOUNTER — Other Ambulatory Visit: Payer: Self-pay | Admitting: Family Medicine

## 2022-10-25 DIAGNOSIS — E785 Hyperlipidemia, unspecified: Secondary | ICD-10-CM

## 2022-10-28 ENCOUNTER — Telehealth: Payer: Self-pay

## 2022-10-28 ENCOUNTER — Other Ambulatory Visit (HOSPITAL_COMMUNITY): Payer: Self-pay

## 2022-10-28 DIAGNOSIS — E783 Hyperchylomicronemia: Secondary | ICD-10-CM

## 2022-10-28 NOTE — Telephone Encounter (Signed)
Referral to pulmonary ordered.

## 2022-10-28 NOTE — Telephone Encounter (Signed)
Pharmacy Patient Advocate Encounter   Received notification from Physician's Office that prior authorization for REPATHA is required/requested.   Insurance verification completed.   The patient is insured through [facility] .   Per test claim: PA required; PA submitted to [facility] via CoverMyMeds Key/confirmation #/EOC [id number] Status is pending

## 2022-10-31 ENCOUNTER — Other Ambulatory Visit (HOSPITAL_COMMUNITY): Payer: Self-pay

## 2022-10-31 MED ORDER — REPATHA SURECLICK 140 MG/ML ~~LOC~~ SOAJ: 1.0000 mL | SUBCUTANEOUS | 3 refills | Status: DC

## 2022-10-31 NOTE — Telephone Encounter (Signed)
Submitted as an initial but with indication hyperlipidemia. Can resubmit with ASCVD as indication.

## 2022-10-31 NOTE — Telephone Encounter (Signed)
Rx(s) sent to pharmacy electronically. Update sent to patient via MyChart

## 2022-10-31 NOTE — Addendum Note (Signed)
Addended by: Lindell Spar on: 10/31/2022 03:29 PM   Modules accepted: Orders

## 2022-10-31 NOTE — Telephone Encounter (Signed)
Pharmacy Patient Advocate Encounter  Received notification from [facility] that Prior Authorization for REPATHA has been DENIED. Please advise how you'd like to proceed. See denial reason below.

## 2022-10-31 NOTE — Telephone Encounter (Signed)
Pharmacy Patient Advocate Encounter  Received notification from [facility] that Prior Authorization for REPATHA has been APPROVED from 10/31/22 to 05/03/23. Ran test claim, MUST BE FILLED AT [employer]

## 2022-11-04 ENCOUNTER — Telehealth: Payer: Self-pay | Admitting: *Deleted

## 2022-11-04 DIAGNOSIS — G4733 Obstructive sleep apnea (adult) (pediatric): Secondary | ICD-10-CM

## 2022-11-04 DIAGNOSIS — R0683 Snoring: Secondary | ICD-10-CM

## 2022-11-04 NOTE — Telephone Encounter (Signed)
The patient has been notified of the result and verbalized understanding.  All questions (if any) were answered. Latrelle Dodrill, CMA 11/04/2022 6:34 PM    Will send to the sleep nurse to place the Pulmonary sleep referral.

## 2022-11-04 NOTE — Telephone Encounter (Signed)
-----   Message from Armanda Magic sent at 10/24/2022  1:52 PM EDT ----- Unsuccessul BiPAP titration due to ongoing respiratory events and emerging central events.  Please refer to Pulmonary SLeep Medicine for further recommendations

## 2022-11-04 NOTE — Telephone Encounter (Signed)
NMR lipoprofile, LPa, ApoB ordered - lab slips mailed. Labs due ~ Nov/Dec 2024

## 2022-11-04 NOTE — Addendum Note (Signed)
Addended by: Lindell Spar on: 11/04/2022 07:50 AM   Modules accepted: Orders

## 2022-11-05 NOTE — Addendum Note (Signed)
Addended by: Luellen Pucker on: 11/05/2022 08:56 AM   Modules accepted: Orders

## 2022-11-05 NOTE — Telephone Encounter (Signed)
Call to patient to explain Dr. Mayford Knife recommends referral to pulmonology for further recommendations s/p unsuccessful bipap titration. Patient verbalizes understanding and agrees to plan. Order placed.

## 2022-11-21 ENCOUNTER — Other Ambulatory Visit: Payer: Self-pay | Admitting: Family Medicine

## 2022-11-21 ENCOUNTER — Other Ambulatory Visit: Payer: Self-pay | Admitting: Internal Medicine

## 2022-11-21 DIAGNOSIS — E785 Hyperlipidemia, unspecified: Secondary | ICD-10-CM

## 2022-11-24 ENCOUNTER — Other Ambulatory Visit (HOSPITAL_COMMUNITY): Payer: Self-pay | Admitting: Psychiatry

## 2022-11-24 DIAGNOSIS — F319 Bipolar disorder, unspecified: Secondary | ICD-10-CM

## 2022-11-24 DIAGNOSIS — F419 Anxiety disorder, unspecified: Secondary | ICD-10-CM

## 2022-11-24 MED ORDER — EZETIMIBE 10 MG PO TABS: 10.0000 mg | ORAL_TABLET | Freq: Every day | ORAL | 3 refills | Status: DC

## 2022-11-24 MED ORDER — FENOFIBRATE 160 MG PO TABS
160.0000 mg | ORAL_TABLET | Freq: Every day | ORAL | 0 refills | Status: DC
Start: 2022-11-24 — End: 2023-01-05

## 2022-11-24 MED ORDER — ROSUVASTATIN CALCIUM 40 MG PO TABS
40.0000 mg | ORAL_TABLET | Freq: Every day | ORAL | 0 refills | Status: DC
Start: 2022-11-24 — End: 2023-01-05

## 2022-11-27 ENCOUNTER — Encounter (HOSPITAL_COMMUNITY): Payer: Self-pay | Admitting: Psychiatry

## 2022-11-27 ENCOUNTER — Telehealth (HOSPITAL_COMMUNITY): Payer: BC Managed Care – PPO | Admitting: Psychiatry

## 2022-11-27 VITALS — Wt 209.0 lb

## 2022-11-27 DIAGNOSIS — F319 Bipolar disorder, unspecified: Secondary | ICD-10-CM

## 2022-11-27 DIAGNOSIS — F419 Anxiety disorder, unspecified: Secondary | ICD-10-CM | POA: Diagnosis not present

## 2022-11-27 MED ORDER — SERTRALINE HCL 100 MG PO TABS
150.0000 mg | ORAL_TABLET | Freq: Every day | ORAL | 0 refills | Status: DC
Start: 2022-11-27 — End: 2023-03-05

## 2022-11-27 MED ORDER — LAMOTRIGINE 150 MG PO TABS
150.0000 mg | ORAL_TABLET | Freq: Two times a day (BID) | ORAL | 0 refills | Status: DC
Start: 2022-11-27 — End: 2023-03-05

## 2022-11-27 MED ORDER — OLANZAPINE 15 MG PO TABS
15.0000 mg | ORAL_TABLET | Freq: Every day | ORAL | 0 refills | Status: DC
Start: 2022-11-27 — End: 2023-03-05

## 2022-11-27 NOTE — Progress Notes (Signed)
[facility] MD Virtual Progress Note   Patient Location: Home Provider Location: Home Office  I connect with patient by video and verified that I am speaking with correct person by using two identifiers. I discussed the limitations of evaluation and management by telemedicine and the availability of in person appointments. I also discussed with the patient that there may be a patient responsible charge related to this service. The patient expressed understanding and agreed to proceed.  Timothy Burnett [id number] 57 y.o.  11/27/2022 1:05 PM  History of Present Illness:  Patient is evaluated by video session.  He just had a week vacation and went to beach with the family.  He is going to start work the Sunday.  He reported things are going okay.  He denies any irritability, anger, frustration or any mania.  He is frustrated with his sleep study.  Now he is told that he does not need a machine.  Before he was given diagnosis of apnea at home study.  Patient told he was recommended to see a pulmonologist and he had appointment coming up on September 13.  He admitted continued to struggle with sleep but otherwise anxiety and mood is stable.  He has no tremors or shakes or any EPS.  Since moved back to [employer] in [place] he is very happy.  He admitted some time job is challenging when dealing with young coworkers who does not understand the ethics and discipline of the work.  He also started a new medication to lower blood triglycerides.  He had appointment coming up in few weeks with his primary care for blood work.  Denies any hallucination, paranoia or any suicidal thoughts.  His appetite is okay.  His weight is stable.  He like to keep his current medication.  He is compliant with Zoloft, Lamictal and olanzapine.  Past Psychiatric History: H/O mania and impulsive behavior.  No h/o psychiatric inpatient treatment or suicidal attempt.  Tried Abilify and Geodon with poor outcome. Vistaril made  groggy. Trazodone caused dreams, Melatonin did not work.  Gabapentin caused numbness and BuSpar did not work.      Outpatient Encounter Medications as of 11/27/2022  Medication Sig   B Complex Vitamins (B COMPLEX PO) Take by mouth.   busPIRone (BUSPAR) 5 MG tablet TAKE ONE TABLET BY MOUTH ONE TIME DAILY AT BEDTIME   Evolocumab (REPATHA SURECLICK) 140 MG/ML SOAJ Inject 140 mg into the skin every 14 (fourteen) days.   ezetimibe (ZETIA) 10 MG tablet Take 1 tablet (10 mg total) by mouth daily.   fenofibrate 160 MG tablet Take 1 tablet (160 mg total) by mouth daily.   lamoTRIgine (LAMICTAL) 150 MG tablet Take 1 tablet (150 mg total) by mouth 2 (two) times daily.   Multiple Vitamin (MULTIVITAMIN) tablet Take 1 tablet by mouth daily.   OLANZapine (ZYPREXA) 15 MG tablet Take 1 tablet (15 mg total) by mouth at bedtime.   Omega-3 Fatty Acids (FISH OIL) 1200 MG CAPS Take 2 capsules by mouth daily.   rosuvastatin (CRESTOR) 40 MG tablet Take 1 tablet (40 mg total) by mouth daily.   sertraline (ZOLOFT) 100 MG tablet Take 1.5 tablets (150 mg total) by mouth daily.   No facility-administered encounter medications on file as of 11/27/2022.    No results found for this or any previous visit (from the past 2160 hour(s)).   Psychiatric Specialty Exam: Physical Exam  Review of Systems  Weight 209 lb (94.8 kg).There is no height or weight on file  to calculate BMI.  General Appearance: Casual  Eye Contact:  Good  Speech:  Clear and Coherent  Volume:  Normal  Mood:  Euthymic  Affect:  Appropriate  Thought Process:  Goal Directed  Orientation:  Full (Time, Place, and Person)  Thought Content:  WDL  Suicidal Thoughts:  No  Homicidal Thoughts:  No  Memory:  Immediate;   Good Recent;   Good Remote;   Good  Judgement:  Good  Insight:  Good  Psychomotor Activity:  Normal  Concentration:  Concentration: Good and Attention Span: Good  Recall:  Good  Fund of Knowledge:  Good  Language:  Good  Akathisia:   No  Handed:  Right  AIMS (if indicated):     Assets:  Communication Skills Desire for Improvement Housing Resilience Social Support Talents/Skills Transportation  ADL's:  Intact  Cognition:  WNL  Sleep:  fair     Assessment/Plan: Bipolar 1 disorder (HCC) - Plan: OLANZapine (ZYPREXA) 15 MG tablet, lamoTRIgine (LAMICTAL) 150 MG tablet, sertraline (ZOLOFT) 100 MG tablet  Anxiety - Plan: sertraline (ZOLOFT) 100 MG tablet  Patient frustrated with sleep study but now he had appointment to see pulmonologist to go over with his results.  At home yesterday he was given diagnosis of apnea but now recent sleep study did not recommend to use machine.  He was not using machine anyway and waiting for in person sleep study.  He does not want to change the medication since it is working okay.  He is no longer taking gabapentin which causes numbness and BuSpar did not help.  Continue olanzapine 15 mg at bedtime, Lamictal 300 mg daily and Zoloft 150 mg daily.  He has no rash, itching, tremors or shakes.  Follow-up in 3 months   Follow Up Instructions:     I discussed the assessment and treatment plan with the patient. The patient was provided an opportunity to ask questions and all were answered. The patient agreed with the plan and demonstrated an understanding of the instructions.   The patient was advised to call back or seek an in-person evaluation if the symptoms worsen or if the condition fails to improve as anticipated.    Collaboration of Care: Other provider involved in patient's care AEB notes are available in epic to review.  Patient/Guardian was advised Release of Information must be obtained prior to any record release in order to collaborate their care with an outside provider. Patient/Guardian was advised if they have not already done so to contact the registration department to sign all necessary forms in order for [place] to release information regarding their care.   Consent:  Patient/Guardian gives verbal consent for treatment and assignment of benefits for services provided during this visit. Patient/Guardian expressed understanding and agreed to proceed.     I provided 20 minutes of non face to face time during this encounter.  Note: This document was prepared by [employer] voice dictation technology and any errors that results from this process are unintentional.    Cleotis Nipper, MD 11/27/2022

## 2022-12-02 ENCOUNTER — Telehealth (HOSPITAL_COMMUNITY): Payer: BC Managed Care – PPO | Admitting: Psychiatry

## 2022-12-11 ENCOUNTER — Encounter: Payer: Self-pay | Admitting: Family Medicine

## 2022-12-11 ENCOUNTER — Ambulatory Visit: Payer: BC Managed Care – PPO | Admitting: Family Medicine

## 2022-12-11 VITALS — BP 122/64 | HR 86 | Temp 98.1°F | Resp 18 | Ht 68.0 in | Wt 213.5 lb

## 2022-12-11 DIAGNOSIS — L989 Disorder of the skin and subcutaneous tissue, unspecified: Secondary | ICD-10-CM

## 2022-12-11 NOTE — Progress Notes (Signed)
   Subjective:    Patient ID: Timothy Burnett, male    DOB: 11-01-1965, 57 y.o.   MRN: [id number]  HPI Atypical moles- pt has a place on R flank and on back that are concerning to him.  Wife brought these to his attention.  She is concerned that this is skin cancer.  No pain.  No itching, oozing, bleeding.   Review of Systems For ROS see HPI     Objective:   Physical Exam Vitals reviewed.  Constitutional:      General: He is not in acute distress.    Appearance: Normal appearance. He is not ill-appearing.  HENT:     Head: Normocephalic and atraumatic.  Skin:    General: Skin is warm and dry.     Findings: Lesion (stuck on hyperpigmented keratosis of R abd wall/chest wall and center of back) present.  Neurological:     General: No focal deficit present.     Mental Status: He is alert and oriented to person, place, and time.  Psychiatric:        Mood and Affect: Mood normal.        Behavior: Behavior normal.        Thought Content: Thought content normal.           Assessment & Plan:  Skin lesions- new.  The areas in question are benign seborrheic keratoses.  Reassurance provided.  But due to hx of sun exposure, will refer to derm for an evaluation.  Pt expressed understanding and is in agreement w/ plan.

## 2022-12-11 NOTE — Patient Instructions (Signed)
Follow up as needed or as scheduled We'll call you to schedule your Dermatology Thankfully the areas of concern are benign seborrheic keratoses Call with any questions or concerns Stay Safe!  Stay Healthy! Happy Early [place] Ouch!!

## 2022-12-12 ENCOUNTER — Encounter: Payer: Self-pay | Admitting: Adult Health

## 2022-12-12 ENCOUNTER — Ambulatory Visit (INDEPENDENT_AMBULATORY_CARE_PROVIDER_SITE_OTHER): Payer: BC Managed Care – PPO | Admitting: Adult Health

## 2022-12-12 VITALS — BP 110/80 | HR 82 | Ht 68.0 in | Wt 213.6 lb

## 2022-12-12 DIAGNOSIS — G4733 Obstructive sleep apnea (adult) (pediatric): Secondary | ICD-10-CM | POA: Insufficient documentation

## 2022-12-12 DIAGNOSIS — R0683 Snoring: Secondary | ICD-10-CM

## 2022-12-12 NOTE — Assessment & Plan Note (Signed)
Severe OSA -original home sleep study showed severe sleep apnea with AHI at 49.1/hour and SpO2 low at 86%.  Patient was set up for a titration study unfortunately was not able to reach optimal levels with ongoing residual events along with central episodes..  Data was reviewed in detail.  Went over findings with Dr. Maple Hudson.  Will we will try patient on BiPAP ST with a backup rate of 8, auto BiPAP IPAP max at 25 and EPAP minimum 21 cmH2O.  Long discussion regarding sleep apnea.  Patient education was given  - discussed how weight can impact sleep and risk for sleep disordered breathing - discussed options to assist with weight loss: combination of diet modification, cardiovascular and strength training exercises   - had an extensive discussion regarding the adverse health consequences related to untreated sleep disordered breathing - specifically discussed the risks for hypertension, coronary artery disease, cardiac dysrhythmias, cerebrovascular disease, and diabetes - lifestyle modification discussed   - discussed how sleep disruption can increase risk of accidents, particularly when driving - safe driving practices were discussed   Plan  Patient Instructions  Will begin BIPAP ST device At bedtime, wear all night long for at least 6hr or more .  Work on healthy weight  Do not drive if sleepy  Use caution with sedating medications  Follow up in 2 months and As needed

## 2022-12-12 NOTE — Patient Instructions (Addendum)
Will begin BIPAP ST device At bedtime, wear all night long for at least 6hr or more .  Work on healthy weight  Do not drive if sleepy  Use caution with sedating medications  Follow up in 2 months with Dr. Wynona Neat or Nalla Purdy NP and As needed (30 min slot)

## 2022-12-12 NOTE — Progress Notes (Signed)
@Patient  ID: Timothy Burnett, male    DOB: May 10, 1965, 57 y.o.   MRN: [id number]  Chief Complaint  Patient presents with   Consult    Referring provider: Sheliah Hatch, MD  HPI: 57 year old male seen for sleep consult December 12, 2022 to establish for sleep apnea  TEST/EVENTS :  Home sleep study (watch Pat) July 27, 2022 AHI 49.1/hour SpO2 low at 86%.    12/12/2022 Sleep consult  Patient presents for sleep consult to establish for sleep apnea.  Patient was kindly referred over by Dr. Rennis Golden.  Patient had previously complained of loud snoring, restless sleep and significant daytime sleepiness.  He was set up for home sleep study that was completed July 27, 2022 that showed severe sleep apnea with AHI at 49.1/hour and SpO2 low at 86%.  He was then referred for a titration study.  This was completed on October 15, 2022.  Titration study notes show that he had inadequate control on CPAP with AHI up to 48 on CPAP 15 cm H2O.  Patient was transitioned to bilevel support with maximum BiPAP at 26/22 with ongoing events and central apneas.  Patient was then transition to BiPAP ST with a backup rate of 8.  Was felt that optimal PAP pressure could be obtained due to ongoing respiratory events.  Moderate central sleep apnea with a CAI at 20.1/hour was noted. We discussed his sleep study and titration study results in detail.  Patient does have a history of bipolar disorder has been on Lamictal and Zyprexa for many years.  He has minimal alcohol intake.  Does not use any pain medications.  Patient works first and second shift.  Says he typically goes to bed and sleeps for about 6 to 8 hours.  He goes to sleep very easily.  But is up a couple times throughout the night.  Patient's weight is up 20 pounds over the last 2 years.  Current weight is at 213 pounds with a BMI of 32.  Patient says he does nap if he has a chance he can sleep up to 2 hours each day.  Patient says he feels tired all the time.  Does not  take any sleep aids.  Drinks about 3 cups of coffee daily.  Has no history of congestive heart failure or stroke.  Epworth score is 11 out of 24.  Typically gets sleepy if he sits down to rest, watch TV in the evening hours and after eating lunch.  Medical history significant for hyperlipidemia, chronic allergies, bipolar disorder.  Patient is married.  Lives at home with his wife.  Works in Engineering geologist.  Works first and second shift.  Quit smoking in 2023.  Social alcohol.  No drug use.  Has no children.  Surgical history tonsillectomy age 87     Allergies  Allergen Reactions   Penicillins Rash    Immunization History  Administered Date(s) Administered   Hepatitis B 12/12/2019, 01/20/2020   Hepb-cpg 12/12/2019, 01/20/2020   Influenza Inj Mdck Quad Pf 01/02/2019   Influenza,inj,Quad PF,6+ Mos 01/04/2017, 12/17/2017   Influenza-Unspecified 01/01/2019, 01/30/2022   PFIZER(Purple Top)SARS-COV-2 Vaccination 06/10/2019, 07/05/2019   Tdap 01/02/2013    Past Medical History:  Diagnosis Date   Bipolar disorder (HCC)    Depression    Hyperlipemia    Ulcer    stomach    Tobacco History: Social History   Tobacco Use  Smoking Status Never  Smokeless Tobacco Never   Counseling given: Not Answered   Outpatient Medications  Prior to Visit  Medication Sig Dispense Refill   B Complex Vitamins (B COMPLEX PO) Take by mouth.     Evolocumab (REPATHA SURECLICK) 140 MG/ML SOAJ Inject 140 mg into the skin every 14 (fourteen) days. 6 mL 3   ezetimibe (ZETIA) 10 MG tablet Take 1 tablet (10 mg total) by mouth daily. 90 tablet 3   fenofibrate 160 MG tablet Take 1 tablet (160 mg total) by mouth daily. 30 tablet 0   lamoTRIgine (LAMICTAL) 150 MG tablet Take 1 tablet (150 mg total) by mouth 2 (two) times daily. 180 tablet 0   Multiple Vitamin (MULTIVITAMIN) tablet Take 1 tablet by mouth daily.     OLANZapine (ZYPREXA) 15 MG tablet Take 1 tablet (15 mg total) by mouth at bedtime. 90 tablet 0    Omega-3 Fatty Acids (FISH OIL) 1200 MG CAPS Take 2 capsules by mouth daily.     rosuvastatin (CRESTOR) 40 MG tablet Take 1 tablet (40 mg total) by mouth daily. 30 tablet 0   sertraline (ZOLOFT) 100 MG tablet Take 1.5 tablets (150 mg total) by mouth daily. 135 tablet 0   No facility-administered medications prior to visit.     Review of Systems:   Constitutional:   No  weight loss, night sweats,  Fevers, chills, + fatigue, or  lassitude.  HEENT:   No headaches,  Difficulty swallowing,  Tooth/dental problems, or  Sore throat,                No sneezing, itching, ear ache, nasal congestion, post nasal drip,   CV:  No chest pain,  Orthopnea, PND, swelling in lower extremities, anasarca, dizziness, palpitations, syncope.   GI  No heartburn, indigestion, abdominal pain, nausea, vomiting, diarrhea, change in bowel habits, loss of appetite, bloody stools.   Resp: No shortness of breath with exertion or at rest.  No excess mucus, no productive cough,  No non-productive cough,  No coughing up of blood.  No change in color of mucus.  No wheezing.  No chest wall deformity  Skin: no rash or lesions.  GU: no dysuria, change in color of urine, no urgency or frequency.  No flank pain, no hematuria   MS:  No joint pain or swelling.  No decreased range of motion.  No back pain.    Physical Exam  BP 110/80 (BP Location: Left Arm, Cuff Size: Large)   Pulse 82   Ht 5\' 8"  (1.727 m)   Wt 213 lb 9.6 oz (96.9 kg)   SpO2 97%   BMI 32.48 kg/m   GEN: A/Ox3; pleasant , NAD, well nourished    HEENT:  [place]/AT,  NOSE-clear, THROAT-clear, no lesions, no postnasal drip or exudate noted. Class 3-4 MP airway   NECK:  Supple w/ fair ROM; no JVD; normal carotid impulses w/o bruits; no thyromegaly or nodules palpated; no lymphadenopathy.    RESP  Clear  P & A; w/o, wheezes/ rales/ or rhonchi. no accessory muscle use, no dullness to percussion  CARD:  RRR, no m/r/g, no peripheral edema, pulses intact, no  cyanosis or clubbing.  GI:   Soft & nt; nml bowel sounds; no organomegaly or masses detected.   Musco: Warm bil, no deformities or joint swelling noted.   Neuro: alert, no focal deficits noted.    Skin: Warm, no lesions or rashes    Lab Results:    BNP No results found for: "BNP"  ProBNP No results found for: "PROBNP"  Imaging: No results found.  Administration  History     None           No data to display          No results found for: "NITRICOXIDE"      Assessment & Plan:   OSA (obstructive sleep apnea) Severe OSA -original home sleep study showed severe sleep apnea with AHI at 49.1/hour and SpO2 low at 86%.  Patient was set up for a titration study unfortunately was not able to reach optimal levels with ongoing residual events along with central episodes..  Data was reviewed in detail.  Went over findings with Dr. Maple Hudson.  Will we will try patient on BiPAP ST with a backup rate of 8, auto BiPAP IPAP max at 25 and EPAP minimum 21 cmH2O.  Long discussion regarding sleep apnea.  Patient education was given  - discussed how weight can impact sleep and risk for sleep disordered breathing - discussed options to assist with weight loss: combination of diet modification, cardiovascular and strength training exercises   - had an extensive discussion regarding the adverse health consequences related to untreated sleep disordered breathing - specifically discussed the risks for hypertension, coronary artery disease, cardiac dysrhythmias, cerebrovascular disease, and diabetes - lifestyle modification discussed   - discussed how sleep disruption can increase risk of accidents, particularly when driving - safe driving practices were discussed   Plan  Patient Instructions  Will begin BIPAP ST device At bedtime, wear all night long for at least 6hr or more .  Work on healthy weight  Do not drive if sleepy  Use caution with sedating medications  Follow up in 2  months and As needed          I spent  45   minutes dedicated to the care of this patient on the date of this encounter to include pre-visit review of records, face-to-face time with the patient discussing conditions above, post visit ordering of testing, clinical documentation with the electronic health record, making appropriate referrals as documented, and communicating necessary findings to members of the patients care team.   Rubye Oaks, NP 12/12/2022

## 2023-01-05 ENCOUNTER — Other Ambulatory Visit: Payer: Self-pay | Admitting: Family Medicine

## 2023-01-05 DIAGNOSIS — E785 Hyperlipidemia, unspecified: Secondary | ICD-10-CM

## 2023-02-10 LAB — APOLIPOPROTEIN B: Apolipoprotein B: 26 mg/dL (ref <90)

## 2023-02-11 ENCOUNTER — Encounter: Payer: Self-pay | Admitting: Adult Health

## 2023-02-11 ENCOUNTER — Ambulatory Visit: Payer: BC Managed Care – PPO | Admitting: Adult Health

## 2023-02-11 VITALS — BP 122/72 | HR 82 | Temp 98.0°F | Ht 68.0 in | Wt 219.8 lb

## 2023-02-11 DIAGNOSIS — G4733 Obstructive sleep apnea (adult) (pediatric): Secondary | ICD-10-CM | POA: Diagnosis not present

## 2023-02-11 LAB — NMR, LIPOPROFILE
Cholesterol, Total: 72 mg/dL — ABNORMAL LOW (ref 100–199)
HDL Particle Number: 35.3 umol/L (ref >=30.5)
HDL-C: 33 mg/dL — ABNORMAL LOW (ref >39)
LDL Particle Number: <300 nmol/L (ref <1000)
LDL Size: 19.6 nmol — ABNORMAL LOW (ref >20.5)
LDL-C (NIH Calc): 18 mg/dL (ref 0–99)
LP-IR Score: 83 — ABNORMAL HIGH (ref <=45)
Small LDL Particle Number: 226 nmol/L (ref <=527)
Triglycerides: 114 mg/dL (ref 0–149)

## 2023-02-11 LAB — LIPOPROTEIN A (LPA): Lipoprotein (a): 65.8 nmol/L (ref <75.0)

## 2023-02-11 NOTE — Patient Instructions (Signed)
Continue on BIPAP ST device At bedtime, wear all night long for at least 6hr or more Keep up good work  Can check with DME regarding mask fitting or mask change.  Work on healthy weight  Do not drive if sleepy  Use caution with sedating medications  Follow up in 3 months with Dr. Wynona Neat and As needed (30 min slot)

## 2023-02-11 NOTE — Progress Notes (Unsigned)
@Patient  ID: Timothy Burnett, male    DOB: 22-May-1965, 57 y.o.   MRN: [id number]  Chief Complaint  Patient presents with   Follow-up    Referring provider: Sheliah Hatch, MD  HPI: 57 year old male former smoker seen for sleep consult December 12, 2022 to establish for sleep apnea Medical history significant for bipolar disorder  TEST/EVENTS :  Home sleep study (watch Pat) July 27, 2022 AHI 49.1/hour SpO2 low at 86%.   09/2022 Titration study. notes show that he had inadequate control on CPAP with AHI up to 48 on CPAP 15 cm H2O. Patient was transitioned to bilevel support with maximum BiPAP at 26/22 with ongoing events and central apneas. Patient was then transition to BiPAP ST with a backup rate of 8. Was felt that optimal PAP pressure could be obtained due to ongoing respiratory events. Moderate central sleep apnea with a CAI at 20.1/hour was noted.   02/11/2023 Follow up ; OSA  Patient presents for 65-month follow-up.  Patient was seen last visit for sleep consult to establish for sleep apnea.  Home sleep study April 2024 showed severe sleep apnea with AHI of 49.1/hour and SpO2 low at 86%.  No significant central sleep apnea noted.  Patient was set for a titration study in July 2024 that showed inadequate control on CPAP.  Patient was tried in addition to bilevel support with ongoing events and central apneas.  He was then transition to a BiPAP ST with a backup rate of 8.  Felt that optimal pressure could be obtained  Snoring has resolved. Decreased daytime sleepiness.  Using   Allergies  Allergen Reactions   Penicillins Rash    Immunization History  Administered Date(s) Administered   Hepatitis B 12/12/2019, 01/20/2020   Hepb-cpg 12/12/2019, 01/20/2020   Influenza Inj Mdck Quad Pf 01/02/2019   Influenza Split 01/21/2023   Influenza,inj,Quad PF,6+ Mos 01/04/2017, 12/17/2017   Influenza-Unspecified 01/01/2019, 01/30/2022   PFIZER(Purple Top)SARS-COV-2 Vaccination  06/10/2019, 07/05/2019, 01/21/2023   Tdap 01/02/2013    Past Medical History:  Diagnosis Date   Bipolar disorder (HCC)    Depression    Hyperlipemia    Ulcer    stomach    Tobacco History: Social History   Tobacco Use  Smoking Status Former   Current packs/day: 0.00   Types: Cigarettes   Quit date: 12/29/2021   Years since quitting: 1.1  Smokeless Tobacco Never   Counseling given: Not Answered   Outpatient Medications Prior to Visit  Medication Sig Dispense Refill   Evolocumab (REPATHA SURECLICK) 140 MG/ML SOAJ Inject 140 mg into the skin every 14 (fourteen) days. 6 mL 3   ezetimibe (ZETIA) 10 MG tablet Take 1 tablet (10 mg total) by mouth daily. 90 tablet 3   fenofibrate 160 MG tablet TAKE ONE TABLET BY MOUTH ONE TIME DAILY 90 tablet 1   lamoTRIgine (LAMICTAL) 150 MG tablet Take 1 tablet (150 mg total) by mouth 2 (two) times daily. 180 tablet 0   Multiple Vitamin (MULTIVITAMIN) tablet Take 1 tablet by mouth daily.     OLANZapine (ZYPREXA) 15 MG tablet Take 1 tablet (15 mg total) by mouth at bedtime. 90 tablet 0   Omega-3 Fatty Acids (FISH OIL) 1200 MG CAPS Take 2 capsules by mouth daily.     rosuvastatin (CRESTOR) 40 MG tablet TAKE ONE TABLET BY MOUTH ONE TIME DAILY 90 tablet 1   sertraline (ZOLOFT) 100 MG tablet Take 1.5 tablets (150 mg total) by mouth daily. 135 tablet 0   B  Complex Vitamins (B COMPLEX PO) Take by mouth.     No facility-administered medications prior to visit.     Review of Systems:   Constitutional:   No  weight loss, night sweats,  Fevers, chills, fatigue, or  lassitude.  HEENT:   No headaches,  Difficulty swallowing,  Tooth/dental problems, or  Sore throat,                No sneezing, itching, ear ache, nasal congestion, post nasal drip,   CV:  No chest pain,  Orthopnea, PND, swelling in lower extremities, anasarca, dizziness, palpitations, syncope.   GI  No heartburn, indigestion, abdominal pain, nausea, vomiting, diarrhea, change in bowel  habits, loss of appetite, bloody stools.   Resp: No shortness of breath with exertion or at rest.  No excess mucus, no productive cough,  No non-productive cough,  No coughing up of blood.  No change in color of mucus.  No wheezing.  No chest wall deformity  Skin: no rash or lesions.  GU: no dysuria, change in color of urine, no urgency or frequency.  No flank pain, no hematuria   MS:  No joint pain or swelling.  No decreased range of motion.  No back pain.    Physical Exam  BP 122/72 (BP Location: Left Arm, Cuff Size: Large)   Pulse 82   Temp 98 F (36.7 C) (Temporal)   Ht 5\' 8"  (1.727 m)   Wt 219 lb 12.8 oz (99.7 kg)   SpO2 98%   BMI 33.42 kg/m   GEN: A/Ox3; pleasant , NAD, well nourished    HEENT:  [place]/AT,  EACs-clear, TMs-wnl, NOSE-clear, THROAT-clear, no lesions, no postnasal drip or exudate noted.   NECK:  Supple w/ fair ROM; no JVD; normal carotid impulses w/o bruits; no thyromegaly or nodules palpated; no lymphadenopathy.    RESP  Clear  P & A; w/o, wheezes/ rales/ or rhonchi. no accessory muscle use, no dullness to percussion  CARD:  RRR, no m/r/g, no peripheral edema, pulses intact, no cyanosis or clubbing.  GI:   Soft & nt; nml bowel sounds; no organomegaly or masses detected.   Musco: Warm bil, no deformities or joint swelling noted.   Neuro: alert, no focal deficits noted.    Skin: Warm, no lesions or rashes    Lab Results:  CBC    Component Value Date/Time   WBC 5.8 08/26/2022 0844   RBC 4.25 08/26/2022 0844   HGB 12.9 (L) 08/26/2022 0844   HCT 39.2 08/26/2022 0844   PLT 246.0 08/26/2022 0844   MCV 92.2 08/26/2022 0844   MCH 31.3 04/03/2016 1613   MCHC 32.8 08/26/2022 0844   RDW 14.8 08/26/2022 0844   LYMPHSABS 1.8 08/26/2022 0844   MONOABS 0.5 08/26/2022 0844   EOSABS 0.1 08/26/2022 0844   BASOSABS 0.0 08/26/2022 0844    BMET    Component Value Date/Time   NA 138 08/26/2022 0844   K 4.5 08/26/2022 0844   CL 103 08/26/2022 0844   CO2  25 08/26/2022 0844   GLUCOSE 88 08/26/2022 0844   BUN 23 08/26/2022 0844   CREATININE 1.11 08/26/2022 0844   CREATININE 0.99 04/03/2016 1613   CALCIUM 9.5 08/26/2022 0844    BNP No results found for: "BNP"  ProBNP No results found for: "PROBNP"  Imaging: No results found.  Administration History     None           No data to display  No results found for: "NITRICOXIDE"      Assessment & Plan:   No problem-specific Assessment & Plan notes found for this encounter.     Rubye Oaks, NP 02/11/2023

## 2023-02-12 NOTE — Assessment & Plan Note (Signed)
Severe obstructive sleep apnea diagnosed on home sleep study April 2024 initial diagnostic test showed predominantly obstructive sleep apnea without significant central episodes.  Titration study was unable to obtain optimal control on CPAP or BiPAP with residual events and central apneas.  Most effective control was on BiPAP ST.  Patient is tolerating well.  Would like for him to go for a mask fitting -he has significant mask leaks.  Will continue on current settings.  Follow back up in 3 months. ?Treatment emergent central apnea vs complex sleep apnea.   Plan  Patient Instructions  Continue on BIPAP ST device At bedtime, wear all night long for at least 6hr or more Keep up good work  Can check with DME regarding mask fitting or mask change.  Work on healthy weight  Do not drive if sleepy  Use caution with sedating medications  Follow up in 3 months with Dr. Wynona Neat and As needed (30 min slot)

## 2023-03-02 ENCOUNTER — Other Ambulatory Visit (HOSPITAL_COMMUNITY): Payer: Self-pay | Admitting: Psychiatry

## 2023-03-02 DIAGNOSIS — F419 Anxiety disorder, unspecified: Secondary | ICD-10-CM

## 2023-03-02 DIAGNOSIS — F319 Bipolar disorder, unspecified: Secondary | ICD-10-CM

## 2023-03-05 ENCOUNTER — Other Ambulatory Visit (HOSPITAL_COMMUNITY): Payer: Self-pay | Admitting: *Deleted

## 2023-03-05 ENCOUNTER — Telehealth (HOSPITAL_COMMUNITY): Payer: Self-pay | Admitting: *Deleted

## 2023-03-05 DIAGNOSIS — F419 Anxiety disorder, unspecified: Secondary | ICD-10-CM

## 2023-03-05 DIAGNOSIS — F319 Bipolar disorder, unspecified: Secondary | ICD-10-CM

## 2023-03-05 MED ORDER — SERTRALINE HCL 100 MG PO TABS: 150.0000 mg | ORAL_TABLET | Freq: Every day | ORAL | 0 refills | Status: DC

## 2023-03-05 MED ORDER — LAMOTRIGINE 150 MG PO TABS: 150.0000 mg | ORAL_TABLET | Freq: Two times a day (BID) | ORAL | 0 refills | Status: DC

## 2023-03-05 MED ORDER — OLANZAPINE 15 MG PO TABS: 15.0000 mg | ORAL_TABLET | Freq: Every day | ORAL | 0 refills | Status: DC

## 2023-03-05 NOTE — Telephone Encounter (Signed)
Ok to refill 

## 2023-03-05 NOTE — Telephone Encounter (Signed)
Pt called requesting refills on all meds as he didn't realize he didn't have a f/u appointment and now is not going to be seen until 06/01/23. Pt last appointments and 90 day fills were on 11/27/22. Ok to fill for 90 days?

## 2023-04-20 NOTE — Progress Notes (Deleted)
Cardiology Office Note:  .   Date:  04/20/2023  ID:  Timothy Burnett, DOB 1965/11/28, MRN [id number] PCP: Sheliah Hatch, MD  [facility] Providers Cardiologist:  None { Click to update primary MD,subspecialty MD or APP then REFRESH:1}   Patient Profile: .      PMH Dyslipidemia Bipolar disorder Elevated LP(a) Family history early CAD Mother developed heart disease age 59  Referred to [facility] clinic and seen by Dr. Rennis Golden on 03/07/22 for evaluation of high triglycerides.  He reports a history of high cholesterol and a strong family history of high cholesterol and heart disease, his mother previously had 13 stents.  She developed heart disease at age 49.  His lipid panel at that time revealed total cholesterol 189, triglycerides 403, HDL 16 and direct LDL 127.  He also has a history of bipolar disorder treated with olanzapine, a medication known to increase cholesterol and triglycerides.  He was previously contacted about a clinical trial for triglyceride lowering medication in the past but was not screened for that.  He was switched from over-the-counter fish oil to Vascepa 2 g twice daily in addition to fenofibrate and high intensity statin.  LP(a) elevated at 140.2 nmol/L and elevated APO B817.  He underwent coronary calcium scoring which predominantly showed calcium in the RCA, some in the LAD and circumflex as well.  Calcium score was 200, 85th percentile for age/sex matched controls.  He reported progressive shortness of breath at night when he noted waking up gasping and his significant other reports that he snores heavily. Ezetimibe was added to rosuvastatin for management of dyslipidemia and follow-up lipid panel revealed total cholesterol 122, HDL 13, LDL direct 75, and triglycerides 237.  Insurance required that he be on ezetimibe for at least 3 months prior to considering PCSK9i therapy. At the end of 3 months, he was not at goal so he was started on Repatha in  addition to rosuvastatin, ezetimibe, and fenofibrate.   Sleep study 06/2022 revealed AHI 49.1/hr, SpO2 low at 86%. He established care with pulmonology for management of OSA       History of Present Illness: .   Timothy Burnett is a *** 58 y.o. male ***   Discussed the use of AI scribe software for clinical note transcription with the patient, who gave verbal consent to proceed.   ROS: ***       Studies Reviewed: .        *** Risk Assessment/Calculations:   {Does this patient have ATRIAL FIBRILLATION?[phone]} No BP recorded.  {Refresh Note OR Click here to enter BP  :1}***   STOP-Bang Score:  6  { Consider Dx Sleep Disordered Breathing or Sleep Apnea  ICD G47.33          :1}    Physical Exam:   VS:  There were no vitals taken for this visit.   Wt Readings from Last 3 Encounters:  02/11/23 219 lb 12.8 oz (99.7 kg)  12/12/22 213 lb 9.6 oz (96.9 kg)  12/11/22 213 lb 8 oz (96.8 kg)    GEN: Well nourished, well developed in no acute distress NECK: No JVD; No carotid bruits CARDIAC: ***RRR, no murmurs, rubs, gallops RESPIRATORY:  Clear to auscultation without rales, wheezing or rhonchi  ABDOMEN: Soft, non-tender, non-distended EXTREMITIES:  No edema; No deformity     ASSESSMENT AND PLAN: .    CAD Dyslipidemia LDL goal < 70:     {Are you ordering a CV Procedure (e.g. stress  test, cath, DCCV, TEE, etc)?   Press F2        :[id number]}  Dispo: ***  Signed, Eligha Bridegroom, NP-C

## 2023-04-22 ENCOUNTER — Encounter (HOSPITAL_BASED_OUTPATIENT_CLINIC_OR_DEPARTMENT_OTHER): Payer: Self-pay

## 2023-04-22 ENCOUNTER — Encounter (HOSPITAL_BASED_OUTPATIENT_CLINIC_OR_DEPARTMENT_OTHER): Payer: BC Managed Care – PPO | Admitting: Nurse Practitioner

## 2023-05-14 ENCOUNTER — Encounter: Payer: Self-pay | Admitting: Pulmonary Disease

## 2023-05-14 ENCOUNTER — Ambulatory Visit: Payer: BC Managed Care – PPO | Admitting: Pulmonary Disease

## 2023-05-14 VITALS — BP 126/79 | HR 74 | Ht 68.0 in | Wt 224.6 lb

## 2023-05-14 DIAGNOSIS — G4739 Other sleep apnea: Secondary | ICD-10-CM

## 2023-05-14 DIAGNOSIS — G4733 Obstructive sleep apnea (adult) (pediatric): Secondary | ICD-10-CM | POA: Diagnosis not present

## 2023-05-14 NOTE — Progress Notes (Signed)
Timothy Burnett    [id number]    [birth year]  Primary Care Physician:Tabori, Helane Rima, MD  Referring Physician: Sheliah Hatch, MD [address] [place],  [place] [address]  Chief complaint:    Patient in for follow-up for obstructive sleep apnea  HPI:  Diagnosed with severe obstructive sleep apnea Has been using BiPAP therapy Tolerating treatment better since is switched over to a fullface mask from a nasal pillow  He had had his home sleep study performed in April 2024 with severe obstructive sleep apnea with AHI of 49.1 titrated to CPAP of 15. He retitration study performed and he was transition to BiPAPB. iPAP 26/22 with central events  Titrated to BiPAP ST with a backup rate of 8  Current BiPAP settings of 25/21 with a backup rate of 8  Feels better overall Does have some mask leaks  He does monitor his AHI on his app  He feels better overall He wakes up feeling like he is at a good nights rest Outpatient Encounter Medications as of 05/14/2023  Medication Sig   Evolocumab (REPATHA SURECLICK) 140 MG/ML SOAJ Inject 140 mg into the skin every 14 (fourteen) days.   ezetimibe (ZETIA) 10 MG tablet Take 1 tablet (10 mg total) by mouth daily.   fenofibrate 160 MG tablet TAKE ONE TABLET BY MOUTH ONE TIME DAILY   lamoTRIgine (LAMICTAL) 150 MG tablet Take 1 tablet (150 mg total) by mouth 2 (two) times daily.   Multiple Vitamin (MULTIVITAMIN) tablet Take 1 tablet by mouth daily.   OLANZapine (ZYPREXA) 15 MG tablet Take 1 tablet (15 mg total) by mouth at bedtime.   Omega-3 Fatty Acids (FISH OIL) 1200 MG CAPS Take 2 capsules by mouth daily.   rosuvastatin (CRESTOR) 40 MG tablet TAKE ONE TABLET BY MOUTH ONE TIME DAILY   sertraline (ZOLOFT) 100 MG tablet Take 1.5 tablets (150 mg total) by mouth daily.   [DISCONTINUED] B Complex Vitamins (B COMPLEX PO) Take by mouth.   No facility-administered encounter medications on file as of 05/14/2023.    Allergies as of  05/14/2023 - Review Complete 05/14/2023  Allergen Reaction Noted   Penicillins Rash 05/09/2011    Past Medical History:  Diagnosis Date   Bipolar disorder (HCC)    Depression    Hyperlipemia    Ulcer    stomach    Past Surgical History:  Procedure Laterality Date   feet surgery     bone spurs   FOOT SURGERY  2012   Bil   TONSILLECTOMY      Family History  Problem Relation Age of Onset   Hyperlipidemia Mother    Heart disease Mother    Hypertension Mother    Hyperlipidemia Father    Colon cancer Neg Hx    Colon polyps Neg Hx    Esophageal cancer Neg Hx    Rectal cancer Neg Hx    Stomach cancer Neg Hx     Social History   Socioeconomic History   Marital status: Married    Spouse name: Not on file   Number of children: Not on file   Years of education: Not on file   Highest education level: Bachelor's degree (e.g., BA, AB, BS)  Occupational History   Not on file  Tobacco Use   Smoking status: Former    Current packs/day: 0.00    Types: Cigarettes    Quit date: 12/29/2021    Years since quitting: 1.3  Smokeless tobacco: Never  Vaping Use   Vaping status: Never Used  Substance and Sexual Activity   Alcohol use: No    Alcohol/week: 0.0 standard drinks of alcohol    Comment: Occasional    Drug use: No   Sexual activity: Not Currently  Other Topics Concern   Not on file  Social History Narrative   Not on file   Social Drivers of Health   Financial Resource Strain: Low Risk  (12/10/2022)   Overall Financial Resource Strain (CARDIA)    Difficulty of Paying Living Expenses: Not very hard  Food Insecurity: No Food Insecurity (12/10/2022)   Hunger Vital Sign    Worried About Running Out of Food in the Last Year: Never true    Ran Out of Food in the Last Year: Never true  Transportation Needs: No Transportation Needs (12/10/2022)   PRAPARE - Administrator, Civil Service (Medical): No    Lack of Transportation (Non-Medical): No  Physical  Activity: Insufficiently Active (12/10/2022)   Exercise Vital Sign    Days of Exercise per Week: 3 days    Minutes of Exercise per Session: 30 min  Stress: Stress Concern Present (12/10/2022)   [employer] of Occupational Health - Occupational Stress Questionnaire    Feeling of Stress : To some extent  Social Connections: Moderately Integrated (12/10/2022)   Social Connection and Isolation Panel [NHANES]    Frequency of Communication with Friends and Family: Three times a week    Frequency of Social Gatherings with Friends and Family: Once a week    Attends Religious Services: 1 to 4 times per year    Active Member of [employer] or Organizations: No    Attends Engineer, structural: Not on file    Marital Status: Married  Intimate Partner Violence: Unknown (07/03/2021)   Received from [employer], [facility]   HITS    Physically Hurt: Not on file    Insult or Talk Down To: Not on file    Threaten Physical Harm: Not on file    Scream or Curse: Not on file    Review of Systems  Respiratory:  Positive for apnea.   Psychiatric/Behavioral:  Positive for sleep disturbance.     Vitals:   05/14/23 1312 05/14/23 1313  BP:  126/79  Pulse: 74   SpO2: 96%      Physical Exam Constitutional:      Appearance: He is obese.  HENT:     Head: Normocephalic.     Mouth/Throat:     Mouth: Mucous membranes are moist.  Eyes:     General: No scleral icterus. Cardiovascular:     Rate and Rhythm: Normal rate and regular rhythm.     Heart sounds: No murmur heard.    No friction rub.  Pulmonary:     Effort: No respiratory distress.     Breath sounds: No stridor. No wheezing or rhonchi.  Musculoskeletal:     Cervical back: No rigidity or tenderness.  Neurological:     Mental Status: He is alert.  Psychiatric:        Mood and Affect: Mood normal.    Data Reviewed: BiPAP compliance reviewed showing 97% compliance Settings of 25/21 Backup rate of 8  Residual AHI of  11.4  Assessment:  Complex sleep apnea  On BiPAP ST -Tolerating BiPAP well Still has elevated AHI  Overall feeling better  Class I obesity    Plan/Recommendations:  Maintain BiPAP settings  With recent mask  change, tolerated it better  Encouraged to give [place] a call in about 3 to 4 weeks if he still continues to notice elevated AHI on his monitoring May need to change the backup rate from 8 to 11-12  Tentative follow-up in about 3 months  Encouraged to call with significant concerns   Virl Diamond MD [facility] 05/14/2023, 1:32 PM  CC: Sheliah Hatch, MD

## 2023-05-14 NOTE — Patient Instructions (Signed)
Follow up in about 3 months  Continue using the BIPAP nightly  Pay attention to number of events and let us know if persistently high above 10 We may need to increase the backup rate on the machine to help this  Call/ message with concerns

## 2023-05-30 ENCOUNTER — Other Ambulatory Visit (HOSPITAL_COMMUNITY): Payer: Self-pay | Admitting: Psychiatry

## 2023-05-30 DIAGNOSIS — F419 Anxiety disorder, unspecified: Secondary | ICD-10-CM

## 2023-05-30 DIAGNOSIS — F319 Bipolar disorder, unspecified: Secondary | ICD-10-CM

## 2023-06-01 ENCOUNTER — Telehealth (HOSPITAL_BASED_OUTPATIENT_CLINIC_OR_DEPARTMENT_OTHER): Payer: BC Managed Care – PPO | Admitting: Psychiatry

## 2023-06-01 ENCOUNTER — Encounter (HOSPITAL_COMMUNITY): Payer: Self-pay | Admitting: Psychiatry

## 2023-06-01 VITALS — Wt 224.0 lb

## 2023-06-01 DIAGNOSIS — F319 Bipolar disorder, unspecified: Secondary | ICD-10-CM

## 2023-06-01 DIAGNOSIS — F419 Anxiety disorder, unspecified: Secondary | ICD-10-CM | POA: Diagnosis not present

## 2023-06-01 MED ORDER — OLANZAPINE 10 MG PO TABS
10.0000 mg | ORAL_TABLET | Freq: Every day | ORAL | 0 refills | Status: DC
Start: 2023-06-01 — End: 2023-09-03

## 2023-06-01 MED ORDER — SERTRALINE HCL 100 MG PO TABS: 150.0000 mg | ORAL_TABLET | Freq: Every day | ORAL | 0 refills | Status: DC

## 2023-06-01 MED ORDER — LAMOTRIGINE 150 MG PO TABS: 150.0000 mg | ORAL_TABLET | Freq: Two times a day (BID) | ORAL | 0 refills | Status: DC

## 2023-06-01 NOTE — Progress Notes (Signed)
 BH MD/PA/NP OP Progress Note  Virtual Visit via Video Note  I connected with Timothy Burnett on 06/01/23 at  3:00 PM EST by a video enabled telemedicine application and verified that I am speaking with the correct person using two identifiers.  Location: Patient: Work Provider: Economist   I discussed the limitations of evaluation and management by telemedicine and the availability of in person appointments. The patient expressed understanding and agreed to proceed.  06/01/2023 3:03 PM Timothy Burnett  MRN:  [id number]  Chief Complaint:  Chief Complaint  Patient presents with   Follow-up   Medication Refill   HPI: Patient is evaluated by video session.  He reported things are going very well.  He is finally started BiPAP machine but is still needs adjustment.  He is happy that his wife going to start work next Monday as Sales promotion account executive.  He is taking all his medication.  He like to cut down some of his medication because sometimes it makes him tired.  He do not have major concern of side effects but overall he feels things are going very well and like to see if reducing the dose may be a option.  He has no tremors, shakes or any EPS.  His appetite is okay.  His weight is unchanged from the past.  Patient works for [employer] in [place].  He reported job is going well.  He denies any mania, psychosis, hallucination, crying spells or any feeling of hopelessness or worthlessness. He denies any major panic attack.  He is compliant with Lamictal, Zoloft, olanzapine.  He has no rash or any itching.  Visit Diagnosis:    ICD-10-CM   1. Bipolar 1 disorder (HCC)  F31.9 lamoTRIgine (LAMICTAL) 150 MG tablet    OLANZapine (ZYPREXA) 10 MG tablet    sertraline (ZOLOFT) 100 MG tablet    2. Anxiety  F41.9 sertraline (ZOLOFT) 100 MG tablet      Past Psychiatric History: Reviewed H/O mania and impulsive behavior.  No h/o psychiatric inpatient treatment or suicidal attempt.  Tried Abilify and Geodon with  poor outcome. Vistaril made groggy. Trazodone caused dreams, Melatonin did not work.  Gabapentin caused numbness and BuSpar did not work.    Past Medical History:  Past Medical History:  Diagnosis Date   Bipolar disorder (HCC)    Depression    Hyperlipemia    Ulcer    stomach    Past Surgical History:  Procedure Laterality Date   feet surgery     bone spurs   FOOT SURGERY  2012   Bil   TONSILLECTOMY      Family Psychiatric History: Reviewed  Family History:  Family History  Problem Relation Age of Onset   Hyperlipidemia Mother    Heart disease Mother    Hypertension Mother    Hyperlipidemia Father    Colon cancer Neg Hx    Colon polyps Neg Hx    Esophageal cancer Neg Hx    Rectal cancer Neg Hx    Stomach cancer Neg Hx     Social History:  Social History   Socioeconomic History   Marital status: Married    Spouse name: Not on file   Number of children: Not on file   Years of education: Not on file   Highest education level: Bachelor's degree (e.g., BA, AB, BS)  Occupational History   Not on file  Tobacco Use   Smoking status: Former    Current packs/day: 0.00    Types: Cigarettes  Quit date: 12/29/2021    Years since quitting: 1.4   Smokeless tobacco: Never  Vaping Use   Vaping status: Never Used  Substance and Sexual Activity   Alcohol use: No    Alcohol/week: 0.0 standard drinks of alcohol    Comment: Occasional    Drug use: No   Sexual activity: Not Currently  Other Topics Concern   Not on file  Social History Narrative   Not on file   Social Drivers of Health   Financial Resource Strain: Low Risk  (12/10/2022)   Overall Financial Resource Strain (CARDIA)    Difficulty of Paying Living Expenses: Not very hard  Food Insecurity: No Food Insecurity (12/10/2022)   Hunger Vital Sign    Worried About Running Out of Food in the Last Year: Never true    Ran Out of Food in the Last Year: Never true  Transportation Needs: No Transportation Needs  (12/10/2022)   PRAPARE - Administrator, Civil Service (Medical): No    Lack of Transportation (Non-Medical): No  Physical Activity: Insufficiently Active (12/10/2022)   Exercise Vital Sign    Days of Exercise per Week: 3 days    Minutes of Exercise per Session: 30 min  Stress: Stress Concern Present (12/10/2022)   [employer] of Occupational Health - Occupational Stress Questionnaire    Feeling of Stress : To some extent  Social Connections: Moderately Integrated (12/10/2022)   Social Connection and Isolation Panel [NHANES]    Frequency of Communication with Friends and Family: Three times a week    Frequency of Social Gatherings with Friends and Family: Once a week    Attends Religious Services: 1 to 4 times per year    Active Member of [employer] or Organizations: No    Attends Engineer, structural: Not on file    Marital Status: Married    Allergies:  Allergies  Allergen Reactions   Penicillins Rash    Metabolic Disorder Labs: Lab Results  Component Value Date   HGBA1C 5.5 08/08/2011   MPG 111 08/08/2011   No results found for: "PROLACTIN" Lab Results  Component Value Date   CHOL 122 08/26/2022   TRIG 237.0 (H) 08/26/2022   HDL 13.00 (L) 08/26/2022   CHOLHDL 9 08/26/2022   VLDL 47.4 (H) 08/26/2022   LDLCALC 129 (H) 10/13/2018   LDLCALC 112 (H) 10/07/2017   Lab Results  Component Value Date   TSH 4.25 08/26/2022   TSH 3.46 08/21/2021    Therapeutic Level Labs: No results found for: "LITHIUM" No results found for: "VALPROATE" No results found for: "CBMZ"  Current Medications: Current Outpatient Medications  Medication Sig Dispense Refill   Evolocumab (REPATHA SURECLICK) 140 MG/ML SOAJ Inject 140 mg into the skin every 14 (fourteen) days. 6 mL 3   ezetimibe (ZETIA) 10 MG tablet Take 1 tablet (10 mg total) by mouth daily. 90 tablet 3   fenofibrate 160 MG tablet TAKE ONE TABLET BY MOUTH ONE TIME DAILY 90 tablet 1   lamoTRIgine (LAMICTAL)  150 MG tablet Take 1 tablet (150 mg total) by mouth 2 (two) times daily. 180 tablet 0   Multiple Vitamin (MULTIVITAMIN) tablet Take 1 tablet by mouth daily.     OLANZapine (ZYPREXA) 15 MG tablet Take 1 tablet (15 mg total) by mouth at bedtime. 90 tablet 0   Omega-3 Fatty Acids (FISH OIL) 1200 MG CAPS Take 2 capsules by mouth daily.     rosuvastatin (CRESTOR) 40 MG tablet TAKE ONE TABLET BY  MOUTH ONE TIME DAILY 90 tablet 1   sertraline (ZOLOFT) 100 MG tablet Take 1.5 tablets (150 mg total) by mouth daily. 135 tablet 0   No current facility-administered medications for this visit.     Musculoskeletal: Strength & Muscle Tone: within normal limits Gait & Station: normal Patient leans: N/A  Psychiatric Specialty Exam: Review of Systems  Weight 224 lb (101.6 kg).There is no height or weight on file to calculate BMI.  General Appearance: Well Groomed  Eye Contact:  Good  Speech:  Normal Rate  Volume:  Normal  Mood:  Euthymic  Affect:  Congruent  Thought Process:  Goal Directed  Orientation:  Full (Time, Place, and Person)  Thought Content: Logical   Suicidal Thoughts:  No  Homicidal Thoughts:  No  Memory:  Immediate;   Good Recent;   Good Remote;   Good  Judgement:  Good  Insight:  Good  Psychomotor Activity:  Normal  Concentration:  Concentration: Good and Attention Span: Good  Recall:  Good  Fund of Knowledge: Good  Language: Good  Akathisia:  No  Handed:  Right  AIMS (if indicated): not done  Assets:  Communication Skills Desire for Improvement Financial Resources/Insurance Housing Resilience Social Support Talents/Skills Transportation  ADL's:  Intact  Cognition: WNL  Sleep:   good with BIPAP   Screenings: GAD-7    Flowsheet Row Video Visit [facility]01[facility]20[facility] Office Visit from 08/26/2022 in [facility]  Total GAD-7 Score 1 6      PHQ2-9    Flowsheet Row Video Visit  from 06/01/2023 in [facility] Office Visit from 12/11/2022 in [facility] Visit from 08/26/2022 in [facility] Visit from 08/21/2021 in [facility] Visit from 06/20/2020 in [facility] [place] [facility] Total Score 0 2 1 1  0  PHQ-9 Total Score -- 10 6 2  --      Flowsheet Row Video Visit from 08/03/2020 in [facility]  C-SSRS RISK CATEGORY No Risk        Assessment and Plan: Patient is 58 year old with history of bipolar disorder, anxiety.  Currently taking olanzapine 15 mg at bedtime, Lamictal 150 mg twice daily and Zoloft 150 mg daily.  Patient like to cut down the medication since he is doing well.  Will cut down the olanzapine from 15 mg to 10 mg.  I encouraged if he notices worsening of symptoms or call us back.  Patient agreed with the plan.  He like to keep appointment in 3 months.  I encouraged to call us if you need a sooner appointment.  Collaboration of Care: Collaboration of Care: Other provider involved in patient's care AEB notes are available in epic to review  Patient/Guardian was advised Release of Information must be obtained prior to any record release in order to collaborate their care with an outside provider. Patient/Guardian was advised if they have not already done so to contact the registration department to sign all necessary forms in order for [place] to release information regarding their care.   Consent: Patient/Guardian gives verbal consent for treatment and assignment of benefits for services provided during this visit. Patient/Guardian expressed understanding and agreed to proceed.     Follow Up Instructions:    I discussed the assessment and treatment plan with the patient. The patient  was provided an opportunity to  ask questions and all were answered. The patient agreed with the plan and demonstrated an understanding of the instructions.   The patient was advised to call back or seek an in-person evaluation if the symptoms worsen or if the condition fails to improve as anticipated.  I provided 20 minutes of non-face-to-face time during this encounter.  Cleotis Nipper, MD 06/01/2023, 3:03 PM

## 2023-06-07 NOTE — Progress Notes (Unsigned)
 Cardiology Office Note:  .   Date:  06/07/2023  ID:  Timothy Burnett, DOB April 16, 1965, MRN [id number] PCP: Sheliah Hatch, MD  [facility] Providers Cardiologist:  None { Click to update primary MD,subspecialty MD or APP then REFRESH:1}   Patient Profile: .      PMH Dyslipidemia Hypertriglyceridemia Family history early CAD Mother had 13 stents Bipolar disorder on olanzapine Elevated LP(a) Coronary artery disease CT Calcium score 04/02/2022 CAC score 200 (85th percentile) LM 0, LAD 3, LCx 4, RCA 193 OSA  Referred to Advanced lipid disorder clinic and seen by Dr. Rennis Golden 03/07/2022.  History of high cholesterol with both high triglycerides and elevated LDL and  family history of heart disease in his mother who previously had 13 stents.  She developed heart disease as early as age 84.  Lipid panel at that time revealed total cholesterol 189, triglycerides 403, HDL 16, and direct LDL 127.  His medication for bipolar disorder olanzapine is known to increase cholesterol and triglycerides. He was advised to d/c OTC fish oil and start Vascepa.   NMR completed 07/11/2022 with LDL particle number 2401, LDL-C 127, HDL-C 19, triglycerides 261, total cholesterol 193, and small LDL particle number 1602. At office visit 07/22/22, he reported progressive shortness of breath waking up gasping for breath, his significant other reported significant snoring.  He had a STOP-BANG score of 6 and home sleep study was ordered. He was diagnosed with sleep apnea but had difficulty with BiPAP titration and was referred to pulmonology. Lexiscan ordered for ischemia evaluation was low risk. Initially, Repatha was denied but with evidence of ASCVD he did get approval. There was concern for a fib during his sleep study so cardiac monitor was completed 08/22/22. He had one run of SVT but no a fib.   Labs from 02/09/23 revealed LDL particle number < 300, LDL-C 18, HDL-C 33, triglycerides 114, total cholesterol 72, and  small LDL-P 226. He had reduction in APO B from 117 to 26 and in LP(a) from 140.2 to 65.8.        History of Present Illness: .   Timothy Burnett is a *** 58 y.o. male ***   Discussed the use of AI scribe software for clinical note transcription with the patient, who gave verbal consent to proceed.   ROS: ***       Studies Reviewed: Marland Kitchen         Lipoprotein (a)  Date/Time Value Ref Range Status  02/09/2023 10:06 AM 65.8 <75.0 nmol/L Final    Comment:    Note:  Values greater than or equal to 75.0 nmol/L may        indicate an independent risk factor for CHD,        but must be evaluated with caution when applied        to non-Caucasian populations due to the        influence of genetic factors on Lp(a) across        ethnicities.      *** Risk Assessment/Calculations:   {Does this patient have ATRIAL FIBRILLATION?:[phone]} No BP recorded.  {Refresh Note OR Click here to enter BP  :1}***   STOP-Bang Score:  6  { Consider Dx Sleep Disordered Breathing or Sleep Apnea  ICD G47.33          :1}    Physical Exam:   VS:  There were no vitals taken for this visit.   Wt Readings from Last 3 Encounters:  05/14/23  224 lb 9.6 oz (101.9 kg)  02/11/23 219 lb 12.8 oz (99.7 kg)  12/12/22 213 lb 9.6 oz (96.9 kg)    GEN: Well nourished, well developed in no acute distress NECK: No JVD; No carotid bruits CARDIAC: ***RRR, no murmurs, rubs, gallops RESPIRATORY:  Clear to auscultation without rales, wheezing or rhonchi  ABDOMEN: Soft, non-tender, non-distended EXTREMITIES:  No edema; No deformity     ASSESSMENT AND PLAN: .    Dyslipidemia LDL goal < 70: CAD:     {Are you ordering a CV Procedure (e.g. stress test, cath, DCCV, TEE, etc)?   Press F2        :[id number]}  Disposition:***  Signed, Eligha Bridegroom, NP-C

## 2023-06-08 ENCOUNTER — Other Ambulatory Visit: Payer: Self-pay

## 2023-06-08 ENCOUNTER — Encounter: Payer: Self-pay | Admitting: Pulmonary Disease

## 2023-06-08 DIAGNOSIS — G4733 Obstructive sleep apnea (adult) (pediatric): Secondary | ICD-10-CM

## 2023-06-08 NOTE — Telephone Encounter (Signed)
 Can we send an order to change BIPAP ST back up rate from 8 to 12

## 2023-06-10 ENCOUNTER — Ambulatory Visit (HOSPITAL_BASED_OUTPATIENT_CLINIC_OR_DEPARTMENT_OTHER): Payer: BC Managed Care – PPO | Admitting: Nurse Practitioner

## 2023-06-10 ENCOUNTER — Encounter (HOSPITAL_BASED_OUTPATIENT_CLINIC_OR_DEPARTMENT_OTHER): Payer: Self-pay | Admitting: Nurse Practitioner

## 2023-06-10 VITALS — BP 118/70 | HR 80 | Ht 68.0 in | Wt 225.0 lb

## 2023-06-10 DIAGNOSIS — I2510 Atherosclerotic heart disease of native coronary artery without angina pectoris: Secondary | ICD-10-CM

## 2023-06-10 DIAGNOSIS — E7841 Elevated Lipoprotein(a): Secondary | ICD-10-CM

## 2023-06-10 DIAGNOSIS — I493 Ventricular premature depolarization: Secondary | ICD-10-CM | POA: Diagnosis not present

## 2023-06-10 DIAGNOSIS — E785 Hyperlipidemia, unspecified: Secondary | ICD-10-CM | POA: Diagnosis not present

## 2023-06-10 NOTE — Patient Instructions (Addendum)
 Medication Instructions:   Your physician recommends that you continue on your current medications as directed. Please refer to the Current Medication list given to you today.   *If you need a refill on your cardiac medications before your next appointment, please call your pharmacy*   Lab Work:  None ordered.  If you have labs (blood work) drawn today and your tests are completely normal, you will receive your results only by: MyChart Message (if you have MyChart) OR A paper copy in the mail If you have any lab test that is abnormal or we need to change your treatment, we will call you to review the results.   Testing/Procedures:  None ordered.   Follow-Up: At [facility], you and your health needs are our priority.  As part of our continuing mission to provide you with exceptional heart care, we have created designated Provider Care Teams.  These Care Teams include your primary Cardiologist (physician) and Advanced Practice Providers (APPs -  Physician Assistants and Nurse Practitioners) who all work together to provide you with the care you need, when you need it.  We recommend signing up for the patient portal called "MyChart".  Sign up information is provided on this After Visit Summary.  MyChart is used to connect with patients for Virtual Visits (Telemedicine).  Patients are able to view lab/test results, encounter notes, upcoming appointments, etc.  Non-urgent messages can be sent to your provider as well.   To learn more about what you can do with MyChart, go to [email or web address].    Your next appointment:   6 month(s)  Provider:   K. [place] Hilty, MD or Eligha Bridegroom, NP    Other Instructions  Your physician wants you to follow-up in: 6 months.  You will receive a reminder letter in the mail two months in advance. If you don't receive a letter, please call our office to schedule the follow-up appointment.      Mediterranean Diet  Why follow it?  Research shows. Those who follow the Mediterranean diet have a reduced risk of heart disease  The diet is associated with a reduced incidence of Parkinson's and Alzheimer's diseases People following the diet may have longer life expectancies and lower rates of chronic diseases  The Dietary Guidelines for Americans recommends the Mediterranean diet as an eating plan to promote health and prevent disease  What Is the Mediterranean Diet?  Healthy eating plan based on typical foods and recipes of Mediterranean-style cooking The diet is primarily a plant based diet; these foods should make up a majority of meals   Starches - Plant based foods should make up a majority of meals - They are an important sources of vitamins, minerals, energy, antioxidants, and fiber - Choose whole grains, foods high in fiber and minimally processed items  - Typical grain sources include wheat, oats, barley, corn, brown rice, bulgar, farro, millet, polenta, couscous  - Various types of beans include chickpeas, lentils, fava beans, black beans, white beans   Fruits  Veggies - Large quantities of antioxidant rich fruits & veggies; 6 or more servings  - Vegetables can be eaten raw or lightly drizzled with oil and cooked  - Vegetables common to the traditional Mediterranean Diet include: artichokes, arugula, beets, broccoli, brussel sprouts, cabbage, carrots, celery, collard greens, cucumbers, eggplant, kale, leeks, lemons, lettuce, mushrooms, okra, onions, peas, peppers, potatoes, pumpkin, radishes, rutabaga, shallots, spinach, sweet potatoes, turnips, zucchini - Fruits common to the Mediterranean Diet include: apples, apricots, avocados, cherries, clementines,  dates, figs, grapefruits, grapes, melons, nectarines, oranges, peaches, pears, pomegranates, strawberries, tangerines  Fats - Replace butter and margarine with healthy oils, such as olive oil, canola oil, and tahini  - Limit nuts to no more than a handful a day  -  Nuts include walnuts, almonds, pecans, pistachios, pine nuts  - Limit or avoid candied, honey roasted or heavily salted nuts - Olives are central to the Mediterranean diet - can be eaten whole or used in a variety of dishes   Meats Protein - Limiting red meat: no more than a few times a month - When eating red meat: choose lean cuts and keep the portion to the size of deck of cards - Eggs: approx. 0 to 4 times a week  - Fish and lean poultry: at least 2 a week  - Healthy protein sources include, chicken, [place], lean beef, lamb - Increase intake of seafood such as tuna, salmon, trout, mackerel, shrimp, scallops - Avoid or limit high fat processed meats such as sausage and bacon  Dairy - Include moderate amounts of low fat dairy products  - Focus on healthy dairy such as fat free yogurt, skim milk, low or reduced fat cheese - Limit dairy products higher in fat such as whole or 2% milk, cheese, ice cream  Alcohol - Moderate amounts of red wine is ok  - No more than 5 oz daily for women (all ages) and men older than age 45  - No more than 10 oz of wine daily for men younger than 27  Other - Limit sweets and other desserts  - Use herbs and spices instead of salt to flavor foods  - Herbs and spices common to the traditional Mediterranean Diet include: basil, bay leaves, chives, cloves, cumin, fennel, garlic, lavender, marjoram, mint, oregano, parsley, pepper, rosemary, sage, savory, sumac, tarragon, thyme   It's not just a diet, it's a lifestyle:  The Mediterranean diet includes lifestyle factors typical of those in the region  Foods, drinks and meals are best eaten with others and savored Daily physical activity is important for overall good health This could be strenuous exercise like running and aerobics This could also be more leisurely activities such as walking, housework, yard-work, or taking the stairs Moderation is the key; a balanced and healthy diet accommodates most foods and  drinks Consider portion sizes and frequency of consumption of certain foods   Meal Ideas & Options:  Breakfast:  Whole wheat toast or whole wheat English muffins with peanut butter & hard boiled egg Steel cut oats topped with apples & cinnamon and skim milk  Fresh fruit: banana, strawberries, melon, berries, peaches  Smoothies: strawberries, bananas, greek yogurt, peanut butter Low fat greek yogurt with blueberries and granola  Egg white omelet with spinach and mushrooms Breakfast couscous: whole wheat couscous, apricots, skim milk, cranberries  Sandwiches:  Hummus and grilled vegetables (peppers, zucchini, squash) on whole wheat bread   Grilled chicken on whole wheat pita with lettuce, tomatoes, cucumbers or tzatziki  [place] salad on whole wheat bread: tuna salad made with greek yogurt, olives, red peppers, capers, green onions Garlic rosemary lamb pita: lamb sauted with garlic, rosemary, salt & pepper; add lettuce, cucumber, greek yogurt to pita - flavor with lemon juice and black pepper  Seafood:  Mediterranean grilled salmon, seasoned with garlic, basil, parsley, lemon juice and black pepper Shrimp, lemon, and spinach whole-grain pasta salad made with low fat greek yogurt  Seared scallops with lemon orzo  Seared tuna steaks  seasoned salt, pepper, coriander topped with tomato mixture of olives, tomatoes, olive oil, minced garlic, parsley, green onions and cappers  Meats:  Herbed greek chicken salad with kalamata olives, cucumber, feta  Red bell peppers stuffed with spinach, bulgur, lean ground beef (or lentils) & topped with feta   Kebabs: skewers of chicken, tomatoes, onions, zucchini, squash  [place] burgers: made with red onions, mint, dill, lemon juice, feta cheese topped with roasted red peppers Vegetarian Cucumber salad: cucumbers, artichoke hearts, celery, red onion, feta cheese, tossed in olive oil & lemon juice  Hummus and whole grain pita points with a greek salad (lettuce,  tomato, feta, olives, cucumbers, red onion) Lentil soup with celery, carrots made with vegetable broth, garlic, salt and pepper  Tabouli salad: parsley, bulgur, mint, scallions, cucumbers, tomato, radishes, lemon juice, olive oil, salt and pepper.  Adopting a Healthy Lifestyle.   Weight: Know what a healthy weight is for you (roughly BMI <25) and aim to maintain this. You can calculate your body mass index on your smart phone. Unfortunately, this is not the most accurate measure of healthy weight, but it is the simplest measurement to use. A more accurate measurement involves body scanning which measures lean muscle, fat tissue and bony density. We do not have this equipment at [facility].    Diet: Aim for 7+ servings of fruits and vegetables daily Limit animal fats in diet for cholesterol and heart health - choose grass fed whenever available Avoid highly processed foods (fast food burgers, tacos, fried chicken, pizza, hot dogs, french fries)  Saturated fat comes in the form of butter, lard, coconut oil, margarine, partially hydrogenated oils, and fat in meat. These increase your risk of cardiovascular disease.  Use healthy plant oils, such as olive, canola, soy, corn, sunflower and peanut.  Whole foods such as fruits, vegetables and whole grains have fiber  Men need > 38 grams of fiber per day Women need > 25 grams of fiber per day  Load up on vegetables and fruits - one-half of your plate: Aim for color and variety, and remember that potatoes dont count. Go for whole grains - one-quarter of your plate: Whole wheat, barley, wheat berries, quinoa, oats, brown rice, and foods made with them. If you want pasta, go with whole wheat pasta. Protein power - one-quarter of your plate: Fish, chicken, beans, and nuts are all healthy, versatile protein sources. Limit red meat. You need carbohydrates for energy! The type of carbohydrate is more important than the amount. Choose carbohydrates such as  vegetables, fruits, whole grains, beans, and nuts in the place of white rice, white pasta, potatoes (baked or fried), macaroni and cheese, cakes, cookies, and donuts.  If youre thirsty, drink water. Coffee and tea are good in moderation, but skip sugary drinks and limit milk and dairy products to one or two daily servings. Keep sugar intake at 6 teaspoons or 24 grams or LESS       Exercise: Aim for 150 min of moderate intensity exercise weekly for heart health, and weights twice weekly for bone health Stay active - any steps are better than no steps! Aim for 7-9 hours of sleep daily  Tackling Obesity with Lifestyle Changes  Obesity- What is it? And What can we do about it?  Obesity is a chronic complex disease defined as excessive fat deposits that can have a negative effect on our health. It can lead to many other diseases including type 2 diabetes.  Weight gain occurs when the amount  of energy (calories) we consume is greater than the amount we use.  When our energy output is greater than our energy input we lose weight. The basic concept is simple, but in reality, it's much more complicated.  Unfortunately, in some people, our bodies have many ways it can compensate when we try to eat less and move more which can prevent [place] from changing our weight. This can lead to some people having a much more difficult time losing weight even when they put healthy habits into practice. This can be frustrating. We want to focus on healthy habits, physical activity and how we feel, and less the number on the scale.  Food As Energy  Calories  Calories is just a unit of measurement for energy.  Counting calories is not required to lose weight but counting for a short period of time can:   help you learn good portion sizes   Learn what your true energy needs are.   Help you be more aware of your snacking or grazing habits  To help calculate how many calories you should be eating, the NIH  has a great body weight planner calculator at BeverageBuggy.si  Types of Energy Expenditure  Basal Metabolic Rate (BMR) Energy that our bodies use to preform everyday tasks. More muscle mass through resistance training can increase this a small amount  Thermic Effect of Food The amount of energy that it takes to breakdown the food we eat. This will be highest when we eat protein and fiber rich foods  Exercise Energy Expenditure The amount of energy used during formal exercise (walking, biking, weightlifting)  Non-exercise activity thermogenesis (NEAT) The amount of energy spent on activities that are not formal exercise (standing, fidgeting). Therefore, it is not only important to do formal exercise but also move around throughout the day.  Managing The Meal  Macro nutrients (carbohydrates, fats and protein, fiber, water)  Micronutrients (vitamins, minerals)  Dietary Fiber  Benefits Examples Cautions  Soluble fiber  Decreases cholesterol  improve blood sugar control,  Feeds our gut bacteria  Allows [place] to feel fuller for longer so we eat less  fruits  oats  barley  legumes  peas  Beans  vegetables (broccoli) and root vegetables (carrots) Add fiber into your diet slowly and be sure to drink at least 8 cups of water a day. This will help limit gas, bloating, diarrhea, or constipation.  Insoluble Fiber  Improves digestive health by making stool easier to pass  Allows [place] to feel fuller for longer so we eat less  whole grains  nuts  seeds  skin of fruit  vegetables (green beans, zucchini, cauliflower)  Tricks to add more fiber to your diet   Add beans (pinto, kidney, lima, navy and garbanzo) to salads, ground meat or brown rice   Add nuts or seeds and or fresh/frozen fruit to yogurt, cottage cheese, salads or steel cut oats   Cut up vegetables and eat with hummus   Look for unsweetened whole grain cereals with at least 5g of fiber per serving   Switch to  whole grain bread. Look for bread that has whole grain flour as the first ingredient and has more fiber than carbs if you were to multiple the fiber x 10.   Try bulgar, barely, quinoa, buckwheat, brown rice wild rice instead of white rice   Keep frozen vegetables on hand to add to dishes or soups  Meal Planning:  Meal planning is the key to setting you up  for success. Here are some examples of healthy meal options.  Breakfast  Option 1: Omelette with vegetables (1 egg, spinach, mushrooms, or other vegetable of your choice), 2 slices whole-grain toast, tip of thumb size butter or soft margarine,  cup low-fat milk or yogurt  Option 2: steel-cut rolled oats (? cup dry), 1 tbsp peanut butter added to cooked oats,  cup low-fat milk.  Option 3: 2 slices whole-grain or rye toast with avocado spread ( small avocado mased with herbs and pepper to taste), 1 poached egg or [place] up (cooked to your liking)  Option 4:  cup plain 0% [place] yogurt topped with  cup berries and  cup walnuts or almonds, 2 slices whole-grain or rye toast, tip of thumb size soft margarine/butter  Lunch:  Option 1: 2 cups red lentil soup, green salad with 1 tbsp homemade vinaigrette (extra virgin olive oil and vinegar of choice plus spices)  Option 2: 3 oz. roasted chicken, 2 slices whole-grain bread, 2 tsp mayonnaise, mustard, lettuce, tomato if desired, 1 fruit (example: medium-sized apple or small pear)  Option 3: 3 oz. tuna packed in water, 1 whole-wheat pita (6 inch), 2 tsp mayonnaise, lettuce, tomato, or other non-starchy vegetable of your choice, 1 fruit (example: medium-sized apple or small pear)  Option 4: 1 serving of garden veggie buddha bowl with lentils and tahini sauce and 1 cup berries topped with  cup plain 0% Greek yogurt  Dinner:  Option 1: 1 serving roasted cauliflower salad, 3-4 oz. grilled or baked pork loin chop, 1/2 cup mashed potato, or brown rice or quinoa  Option 2: 1 serving fish  (baked, grilled or air fried), green salad, 1 tbsp homemade vinaigrette,  cup cooked couscous  Option 3: 1 cup cooked whole grained pasta (example: spaghetti, spirals, macaroni),  cup favorite pasta sauce (preferably homemade), 3-4 oz. grilled or baked chicken, green salad, 1 tbsp homemade vinaigrette  Option 4: 1 serving oven roasted salmon,  cup mashed sweet potato or couscous or brown rice or quinoa, broccoli (steamed or roasted)  Healthy snacks:   Carrots or celery with 1 tbsp of hummus   1 medium-sized fruit (apple or orange)   1 cup plain 0% [place] yogurt with  cup berries   Half apple, sliced, with 1 tbsp (15 mL) peanut or almond butter  Dining out:  Eating away from home has become a part of many people's lifestyle. Making healthy choices when you are eating out is important too. Portion size is an important part of healthy choices. Most branded fast-food places provide calories, sodium, and fat content for their menu items. www.calorieking.com would be great resource to find nutrition facts for your favorite brands and fast-food restaurants. Company specific website can be Chief Technology Officer for nutrition information for their items. (e.g. www.mcdonalds.com or www.nutritionix.com/[place]/menu/premium)  Here are some tips to help you make wise food choices when you are dining out.  Chose more often Avoid  Beverages   Choose more often: Water, low fat milk  Sugar-free/diet drinks  Unsweet tea or coffee    Avoid: Milkshakes, fruit drinks, regular pop  Alcohol, specialty drinks (e.g. iced cappuccino)  Fast food  Choose more often:  Garden salad  Mini subs, pita sandwiches ect with extra vegetables  plain burgers, grilled chicken  Vegetarian or cheese pizza with whole-grain crust    Avoid: Burgers/sandwiches with bacon, cheese, and high-fat sauces  [place] fries, fried chicken, fried fish, poutine, hash browns  Pizza with processed meats  Starters  Choose  more often: Raw vegetables, salads (garden, spinach, fruit)  clear or vegetable soups  Seafood cocktail  Whole-grain breads and rolls    Avoid: Salads with high-fat dressings or toppings  Creamy soups  Wings, egg rolls  onion rings, nachos  White or garlic bread  Main courses Grains & Starches (amount equal to  of your plate)  Choose more often:  Oatmeal, high-fiber/lower-sugar cereals  Whole-grain breads, rice, pasta, barley, couscous  Sweet potatoes    Avoid: Sugary, low-fiber cereals  Large bagels, muffins, croissants, white bread  [place] fries, hash browns, fried rice   Meat and alternative (amount equal to  of your plate)  Choose more often:  Lean meats, poultry, fish, eggs, low-fat cheese  Tofu, vegetable protein Legumes (e.g. lentils, chickpeas, beans)    Avoid: High-salt and/or high-fat meats (e.g. ribs, wings, sausages, wieners, processed lunch meats, imposter meats)   Vegetables (amount equal to  of your plate)  Choose more often:  Salads ([place], garden, spinach), plain vegetables   Avoid:  Salads with creamy, high-fat dressings and   Vegetables on sandwiches ect toppings like bacon bits, croutons, cheese  Desserts  Choose more often:  Fresh fruit, frozen yogourt, skim milk latte    Avoid: Cakes, pies, pastries, ice cream, cheesecake

## 2023-07-03 ENCOUNTER — Other Ambulatory Visit: Payer: Self-pay | Admitting: Family Medicine

## 2023-07-03 DIAGNOSIS — E785 Hyperlipidemia, unspecified: Secondary | ICD-10-CM

## 2023-07-29 ENCOUNTER — Encounter: Payer: Self-pay | Admitting: Pulmonary Disease

## 2023-08-17 ENCOUNTER — Ambulatory Visit: Payer: BC Managed Care – PPO | Admitting: Pulmonary Disease

## 2023-08-17 ENCOUNTER — Other Ambulatory Visit: Payer: Self-pay | Admitting: Pulmonary Disease

## 2023-08-17 ENCOUNTER — Encounter: Payer: Self-pay | Admitting: Pulmonary Disease

## 2023-08-17 VITALS — BP 114/70 | HR 74 | Ht 68.0 in | Wt 225.8 lb

## 2023-08-17 DIAGNOSIS — G4733 Obstructive sleep apnea (adult) (pediatric): Secondary | ICD-10-CM | POA: Diagnosis not present

## 2023-08-17 NOTE — Progress Notes (Signed)
 Timothy Burnett    [id number]    07-21-1965  Primary Care Physician:Tabori, Elenore Griffon, MD  Referring Physician: Jess Morita, MD [phone] [address]  [place] [address]  Chief complaint:   Patient with a history of severe obstructive sleep apnea In for follow-up  HPI:  Diagnosed with severe obstructive sleep apnea Has been using BiPAP therapy  Initially was on CPAP therapy, transition to BiPAP therapy and subsequently BiPAP ST with persistence of central events  Still does have some daytime sleepiness  History of bipolar disorder, shiftwork disorder Able to sleep easily, sometimes does not feel restored  Weight has been stable  Still sleepy during the day  He has a history of hyperlipidemia, bipolar disorder, chronic allergies  Works in retail, quit smoking in 2023  Outpatient Encounter Medications as of 08/17/2023  Medication Sig   Evolocumab  (REPATHA  SURECLICK) 140 MG/ML SOAJ Inject 140 mg into the skin every 14 (fourteen) days.   fenofibrate  160 MG tablet TAKE ONE TABLET BY MOUTH ONE TIME DAILY   lamoTRIgine  (LAMICTAL ) 150 MG tablet Take 1 tablet (150 mg total) by mouth 2 (two) times daily.   Multiple Vitamin (MULTIVITAMIN) tablet Take 1 tablet by mouth daily.   OLANZapine  (ZYPREXA ) 10 MG tablet Take 1 tablet (10 mg total) by mouth at bedtime.   Omega-3 Fatty Acids (FISH OIL) 1200 MG CAPS Take 2 capsules by mouth daily.   rosuvastatin  (CRESTOR ) 40 MG tablet TAKE ONE TABLET BY MOUTH ONE TIME DAILY   sertraline  (ZOLOFT ) 100 MG tablet Take 1.5 tablets (150 mg total) by mouth daily.   ezetimibe  (ZETIA ) 10 MG tablet Take 1 tablet (10 mg total) by mouth daily.   No facility-administered encounter medications on file as of 08/17/2023.    Allergies as of 08/17/2023 - Review Complete 08/17/2023  Allergen Reaction Noted   Penicillins Rash 05/09/2011    Past Medical History:  Diagnosis Date   Bipolar disorder (HCC)    Depression    Hyperlipemia     Ulcer    stomach    Past Surgical History:  Procedure Laterality Date   feet surgery     bone spurs   FOOT SURGERY  2012   Bil   TONSILLECTOMY      Family History  Problem Relation Age of Onset   Hyperlipidemia Mother    Heart disease Mother    Hypertension Mother    Hyperlipidemia Father    Colon cancer Neg Hx    Colon polyps Neg Hx    Esophageal cancer Neg Hx    Rectal cancer Neg Hx    Stomach cancer Neg Hx     Social History   Socioeconomic History   Marital status: Married    Spouse name: Not on file   Number of children: Not on file   Years of education: Not on file   Highest education level: Bachelor's degree (e.g., BA, AB, BS)  Occupational History   Not on file  Tobacco Use   Smoking status: Former    Current packs/day: 0.00    Types: Cigarettes    Quit date: 12/29/2021    Years since quitting: 1.6   Smokeless tobacco: Never  Vaping Use   Vaping status: Never Used  Substance and Sexual Activity   Alcohol use: No    Alcohol/week: 0.0 standard drinks of alcohol    Comment: Occasional    Drug use: No   Sexual activity: Not Currently  Other Topics Concern   Not on file  Social History Narrative   Not on file   Social Drivers of Health   Financial Resource Strain: Low Risk  (12/10/2022)   Overall Financial Resource Strain (CARDIA)    Difficulty of Paying Living Expenses: Not very hard  Food Insecurity: No Food Insecurity (12/10/2022)   Hunger Vital Sign    Worried About Running Out of Food in the Last Year: Never true    Ran Out of Food in the Last Year: Never true  Transportation Needs: No Transportation Needs (12/10/2022)   PRAPARE - Administrator, Civil Service (Medical): No    Lack of Transportation (Non-Medical): No  Physical Activity: Insufficiently Active (12/10/2022)   Exercise Vital Sign    Days of Exercise per Week: 3 days    Minutes of Exercise per Session: 30 min  Stress: Stress Concern Present (12/10/2022)   [employer] of Occupational Health - Occupational Stress Questionnaire    Feeling of Stress : To some extent  Social Connections: Moderately Integrated (12/10/2022)   Social Connection and Isolation Panel [NHANES]    Frequency of Communication with Friends and Family: Three times a week    Frequency of Social Gatherings with Friends and Family: Once a week    Attends Religious Services: 1 to 4 times per year    Active Member of [employer] or Organizations: No    Attends Engineer, structural: Not on file    Marital Status: Married  Intimate Partner Violence: Unknown (07/03/2021)   Received from [employer], [facility]   HITS    Physically Hurt: Not on file    Insult or Talk Down To: Not on file    Threaten Physical Harm: Not on file    Scream or Curse: Not on file    Review of Systems  Respiratory:  Positive for apnea.   Psychiatric/Behavioral:  Positive for sleep disturbance.     Vitals:   08/17/23 1331  BP: 114/70  Pulse: 74  SpO2: 98%     Physical Exam Constitutional:      Appearance: Normal appearance.  HENT:     Head: Normocephalic.     Mouth/Throat:     Mouth: Mucous membranes are moist.  Eyes:     General: No scleral icterus. Cardiovascular:     Rate and Rhythm: Normal rate and regular rhythm.  Pulmonary:     Effort: No respiratory distress.     Breath sounds: No stridor. No wheezing or rhonchi.  Chest:     Chest wall: No tenderness.  Musculoskeletal:     Cervical back: No rigidity or tenderness.  Neurological:     Mental Status: He is alert.  Psychiatric:        Mood and Affect: Mood normal.      Data Reviewed: Compliance report reviewed showing excellent compliance at 100% Average use of 5 hours 36 minutes BiPAP settings of 25/21 with a backup rate of 8 Residual AHI of 9  Home sleep study July 27, 2022 shows AHI of 49.1 with SpO2 nadir of 86%  Assessment:  Severe obstructive sleep apnea on BiPAP therapy  Appears to be tolerating BiPAP  well - There is some issues with mask leak  Plan/Recommendations:  DME referral for pressure change  Decrease pressure from 25/21 with a backup rate of 8-24/18 with a backup rate of 10  Encouraged to give us  a call if pressure change not tolerated  Continue weight loss efforts  Tentative follow-up in 3 months   Myer Artis MD [facility] 08/17/2023, 9:21 PM  CC: Tabori, Katherine E, MD

## 2023-08-17 NOTE — Patient Instructions (Signed)
 DME referral for pressure change  Current pressure 25/21 with a backup rate of 8  New pressure settings 24/18 with a backup rate of 10  Call us  if pressure change not tolerated  Make sure you start checking your app on a regular basis, paying attention to your AHI, we want this number below 10  If pressure change not tolerated, just reach out to us , we can always go back to where we were before  Pressure changes being made because there is a lot of leakage from the download  Follow-up in 3 months

## 2023-08-27 ENCOUNTER — Other Ambulatory Visit (HOSPITAL_COMMUNITY): Payer: Self-pay | Admitting: Psychiatry

## 2023-08-27 ENCOUNTER — Encounter: Payer: Self-pay | Admitting: Family Medicine

## 2023-08-27 ENCOUNTER — Ambulatory Visit (INDEPENDENT_AMBULATORY_CARE_PROVIDER_SITE_OTHER): Payer: BC Managed Care – PPO | Admitting: Family Medicine

## 2023-08-27 VITALS — BP 124/68 | HR 72 | Temp 98.5°F | Resp 15 | Ht 68.0 in | Wt 224.4 lb

## 2023-08-27 DIAGNOSIS — M549 Dorsalgia, unspecified: Secondary | ICD-10-CM | POA: Diagnosis not present

## 2023-08-27 DIAGNOSIS — F419 Anxiety disorder, unspecified: Secondary | ICD-10-CM

## 2023-08-27 DIAGNOSIS — E785 Hyperlipidemia, unspecified: Secondary | ICD-10-CM | POA: Diagnosis not present

## 2023-08-27 DIAGNOSIS — Z125 Encounter for screening for malignant neoplasm of prostate: Secondary | ICD-10-CM

## 2023-08-27 DIAGNOSIS — Z0000 Encounter for general adult medical examination without abnormal findings: Secondary | ICD-10-CM | POA: Diagnosis not present

## 2023-08-27 DIAGNOSIS — F319 Bipolar disorder, unspecified: Secondary | ICD-10-CM

## 2023-08-27 LAB — CBC WITH DIFFERENTIAL/PLATELET
Basophils Absolute: 0 10*3/uL (ref 0.0–0.1)
Basophils Relative: 0.4 % (ref 0.0–3.0)
Eosinophils Absolute: 0.1 10*3/uL (ref 0.0–0.7)
Eosinophils Relative: 2.5 % (ref 0.0–5.0)
HCT: 39 % (ref 39.0–52.0)
Hemoglobin: 12.9 g/dL — ABNORMAL LOW (ref 13.0–17.0)
Lymphocytes Relative: 31.6 % (ref 12.0–46.0)
Lymphs Abs: 1.7 10*3/uL (ref 0.7–4.0)
MCHC: 33 g/dL (ref 30.0–36.0)
MCV: 91.2 fl (ref 78.0–100.0)
Monocytes Absolute: 0.5 10*3/uL (ref 0.1–1.0)
Monocytes Relative: 8.9 % (ref 3.0–12.0)
Neutro Abs: 3.1 10*3/uL (ref 1.4–7.7)
Neutrophils Relative %: 56.6 % (ref 43.0–77.0)
Platelets: 219 10*3/uL (ref 150.0–400.0)
RBC: 4.27 Mil/uL (ref 4.22–5.81)
RDW: 14.7 % (ref 11.5–15.5)
WBC: 5.5 10*3/uL (ref 4.0–10.5)

## 2023-08-27 LAB — HEPATIC FUNCTION PANEL
ALT: 28 U/L (ref 0–53)
AST: 20 U/L (ref 0–37)
Albumin: 4.4 g/dL (ref 3.5–5.2)
Alkaline Phosphatase: 61 U/L (ref 39–117)
Bilirubin, Direct: 0.1 mg/dL (ref 0.0–0.3)
Total Bilirubin: 0.3 mg/dL (ref 0.2–1.2)
Total Protein: 6.5 g/dL (ref 6.0–8.3)

## 2023-08-27 LAB — LIPID PANEL
Cholesterol: 65 mg/dL (ref 0–200)
HDL: 24.6 mg/dL — ABNORMAL LOW (ref >39.00)
LDL Cholesterol: 9 mg/dL (ref 0–99)
NonHDL: 40.78
Total CHOL/HDL Ratio: 3
Triglycerides: 157 mg/dL — ABNORMAL HIGH (ref 0.0–149.0)
VLDL: 31.4 mg/dL (ref 0.0–40.0)

## 2023-08-27 LAB — BASIC METABOLIC PANEL WITH GFR
BUN: 19 mg/dL (ref 6–23)
CO2: 28 meq/L (ref 19–32)
Calcium: 9.4 mg/dL (ref 8.4–10.5)
Chloride: 107 meq/L (ref 96–112)
Creatinine, Ser: 1.06 mg/dL (ref 0.40–1.50)
GFR: 77.27 mL/min (ref >60.00)
Glucose, Bld: 98 mg/dL (ref 70–99)
Potassium: 4.7 meq/L (ref 3.5–5.1)
Sodium: 141 meq/L (ref 135–145)

## 2023-08-27 LAB — TSH: TSH: 3.61 u[IU]/mL (ref 0.35–5.50)

## 2023-08-27 LAB — PSA: PSA: 0.66 ng/mL (ref 0.10–4.00)

## 2023-08-27 MED ORDER — NAPROXEN 500 MG PO TABS: 500.0000 mg | ORAL_TABLET | Freq: Two times a day (BID) | ORAL | 0 refills | Status: DC

## 2023-08-27 NOTE — Progress Notes (Signed)
   Subjective:    Patient ID: Timothy Burnett, male    DOB: January 30, 1966, 58 y.o.   MRN: [id number]  HPI CPE- UTD on colonoscopy, Tdap (requesting records from pharmacy)  Patient Care Team    Relationship Specialty Notifications Start End  Jess Morita, MD PCP - General Family Medicine  06/11/12   Arturo Late, MD Consulting Physician Psychiatry  01/03/15   Nannette Babe, MD Consulting Physician Gastroenterology  06/20/20      Health Maintenance  Topic Date Due   Zoster Vaccines- Shingrix (1 of 2) Never done   DTaP/Tdap/Td (2 - Td or Tdap) 01/03/2023   Pneumococcal Vaccine 48-45 Years old (1 of 2 - PCV) 08/16/2024 (Originally 12/21/1983)   INFLUENZA VACCINE  10/30/2023   Colonoscopy  01/18/2026   HPV VACCINES  Aged Out   Meningococcal B Vaccine  Aged Out   COVID-19 Vaccine  Discontinued   Hepatitis C Screening  Discontinued   HIV Screening  Discontinued      Review of Systems Patient reports no vision/hearing changes, anorexia, fever ,adenopathy, persistant/recurrent hoarseness, swallowing issues, chest pain, palpitations, edema, persistant/recurrent cough, hemoptysis, dyspnea (rest,exertional, paroxysmal nocturnal), gastrointestinal  bleeding (melena, rectal bleeding), abdominal pain, excessive heart burn, GU symptoms (dysuria, hematuria, voiding/incontinence issues) syncope, focal weakness, memory loss, numbness & tingling, skin/hair/nail changes, depression, anxiety, abnormal bruising/bleeding.   + 21 lb weight gain since last year + R sided mid back pain- sxs started ~2 weeks ago.  No known injury or change in activity level.  Sxs are manageable w/ OTC Aleve.      Objective:   Physical Exam General Appearance:    Alert, cooperative, no distress, appears stated age, obese  Head:    Normocephalic, without obvious abnormality, atraumatic  Eyes:    PERRL, conjunctiva/corneas clear, EOM's intact both eyes       Ears:    Normal TM's and external ear canals, both ears  Nose:    Nares normal, septum midline, mucosa normal, no drainage   or sinus tenderness  Throat:   Lips, mucosa, and tongue normal; teeth and gums normal  Neck:   Supple, symmetrical, trachea midline, no adenopathy;       thyroid :  No enlargement/tenderness/nodules  Back:     Symmetric, no curvature, ROM normal, no CVA tenderness  Lungs:     Clear to auscultation bilaterally, respirations unlabored  Chest wall:    No tenderness or deformity  Heart:    Regular rate and rhythm, S1 and S2 normal, no murmur, rub   or gallop  Abdomen:     Soft, non-tender, bowel sounds active all four quadrants,    no masses, no organomegaly  Genitalia:    deferred  Rectal:    Extremities:   Extremities normal, atraumatic, no cyanosis or edema  Pulses:   2+ and symmetric all extremities  Skin:   Skin color, texture, turgor normal, no rashes or lesions  Lymph nodes:   Cervical, supraclavicular, and axillary nodes normal  Neurologic:   CNII-XII intact. Normal strength, sensation and reflexes      throughout          Assessment & Plan:  Mid back pain- new.  No known injury.  Sxs started 2 weeks ago.  R sided.  Suspect musculoskeletal etiology rather than Repatha .  Start scheduled NSAIDs and monitor for improvement.  Pt expressed understanding and is in agreement w/ plan.

## 2023-08-27 NOTE — Assessment & Plan Note (Signed)
 Pt's PE WNL w/ exception of BMI.  UTD on colonoscopy, Tdap.  Check labs.  Anticipatory guidance provided.

## 2023-08-27 NOTE — Patient Instructions (Addendum)
 Follow up in 6 months to recheck cholesterol We'll notify you of your lab results and make any changes if needed Continue to work on healthy diet and regular exercise- you can do it! Start the Naproxen twice daily- take w/ food- for the next 10 days for the back pain.  Keep me updated!!! Call with any questions or concerns Stay Safe!  Stay Healthy! Have a great summer!!

## 2023-08-28 ENCOUNTER — Other Ambulatory Visit (HOSPITAL_COMMUNITY): Payer: Self-pay | Admitting: Psychiatry

## 2023-08-28 DIAGNOSIS — F319 Bipolar disorder, unspecified: Secondary | ICD-10-CM

## 2023-08-31 ENCOUNTER — Ambulatory Visit: Payer: Self-pay | Admitting: Family Medicine

## 2023-09-03 ENCOUNTER — Telehealth (HOSPITAL_COMMUNITY): Admitting: Psychiatry

## 2023-09-03 ENCOUNTER — Encounter (HOSPITAL_COMMUNITY): Payer: Self-pay | Admitting: Psychiatry

## 2023-09-03 DIAGNOSIS — F419 Anxiety disorder, unspecified: Secondary | ICD-10-CM | POA: Diagnosis not present

## 2023-09-03 DIAGNOSIS — F319 Bipolar disorder, unspecified: Secondary | ICD-10-CM

## 2023-09-03 MED ORDER — LAMOTRIGINE 150 MG PO TABS: 150.0000 mg | ORAL_TABLET | Freq: Two times a day (BID) | ORAL | 0 refills | Status: DC

## 2023-09-03 MED ORDER — SERTRALINE HCL 100 MG PO TABS: 150.0000 mg | ORAL_TABLET | Freq: Every day | ORAL | 0 refills | Status: DC

## 2023-09-03 MED ORDER — OLANZAPINE 10 MG PO TABS: 10.0000 mg | ORAL_TABLET | Freq: Every day | ORAL | 0 refills | Status: DC

## 2023-09-03 NOTE — Progress Notes (Signed)
 [name]   Patient Location: Home Provider Location: Home Office  I connect with patient by video and verified that I am speaking with correct person by using two identifiers. I discussed the limitations of evaluation and management by telemedicine and the availability of in person appointments. I also discussed with the patient that there may be a patient responsible charge related to this service. The patient expressed understanding and agreed to proceed.  Timothy Burnett [id number] 58 y.o.  09/03/2023 1:34 PM  History of Present Illness:  Patient is evaluated by video session.  He reported things are going very well.  He is sleeping better with the help of BiPAP.  Recently had a blood work and he was pleased to see that his LDL is only 9.  His PSA is normal.  All other labs are stable.  He denies any mania, psychosis, irritability or any hallucination.  We have cut down his olanzapine  from 15 mg to 10 mg.  He did not notice any worsening of symptoms.  He has no tremor or shakes or any EPS.  He was hoping it will help his weight loss but it did not happen.  However he feel energy level is improved.  His job is going well.  He wants to keep his current medication.  He has no rash, itching.  He is compliant with Zoloft , Lamictal  and olanzapine .  Past Psychiatric History: H/O mania and impulsive behavior.  No h/o psychiatric inpatient treatment or suicidal attempt.  Tried Abilify and Geodon with poor outcome. Vistaril  made groggy. Trazodone  caused dreams, Melatonin did not work.  Gabapentin  caused numbness and BuSpar  did not work.     Outpatient Encounter Medications as of 09/03/2023  Medication Sig   Evolocumab  (REPATHA  SURECLICK) 140 MG/ML SOAJ Inject 140 mg into the skin every 14 (fourteen) days.   ezetimibe  (ZETIA ) 10 MG tablet Take 1 tablet (10 mg total) by mouth daily.   fenofibrate  160 MG tablet TAKE ONE TABLET BY MOUTH ONE TIME DAILY   lamoTRIgine   (LAMICTAL ) 150 MG tablet Take 1 tablet (150 mg total) by mouth 2 (two) times daily.   Multiple Vitamin (MULTIVITAMIN) tablet Take 1 tablet by mouth daily.   naproxen  (NAPROSYN ) 500 MG tablet Take 1 tablet (500 mg total) by mouth 2 (two) times daily with a meal.   OLANZapine  (ZYPREXA ) 10 MG tablet Take 1 tablet (10 mg total) by mouth at bedtime.   Omega-3 Fatty Acids (FISH OIL) 1200 MG CAPS Take 2 capsules by mouth daily.   rosuvastatin  (CRESTOR ) 40 MG tablet TAKE ONE TABLET BY MOUTH ONE TIME DAILY   sertraline  (ZOLOFT ) 100 MG tablet Take 1.5 tablets (150 mg total) by mouth daily.   No facility-administered encounter medications on file as of 09/03/2023.    Recent Results (from the past 2160 hours)  Lipid panel     Status: Abnormal   Collection Time: 08/27/23  8:08 AM  Result Value Ref Range   Cholesterol 65 0 - 200 mg/dL    Comment: ATP III Classification       Desirable:  < 200 mg/dL               Borderline High:  200 - 239 mg/dL          High:  > = 102 mg/dL   Triglycerides 725.3 (H) 0.0 - 149.0 mg/dL    Comment: Normal:  <664 mg/dLBorderline High:  150 - 199 mg/dL   HDL 40.34 (L) >74.25  mg/dL   VLDL 16.1 0.0 - 09.6 mg/dL   LDL Cholesterol 9 0 - 99 mg/dL   Total CHOL/HDL Ratio 3     Comment:                Men          Women1/2 Average Risk     3.4          3.3Average Risk          5.0          4.42X Average Risk          9.6          7.13X Average Risk          15.0          11.0                       NonHDL 40.78     Comment: NOTE:  Non-HDL goal should be 30 mg/dL higher than patient's LDL goal (i.e. LDL goal of < 70 mg/dL, would have non-HDL goal of < 100 mg/dL)  Basic metabolic panel with GFR     Status: None   Collection Time: 08/27/23  8:08 AM  Result Value Ref Range   Sodium 141 135 - 145 mEq/L   Potassium 4.7 3.5 - 5.1 mEq/L   Chloride 107 96 - 112 mEq/L   CO2 28 19 - 32 mEq/L   Glucose, Bld 98 70 - 99 mg/dL   BUN 19 6 - 23 mg/dL   Creatinine, Ser 0.45 0.40 - 1.50 mg/dL    GFR 40.98 >11.91 mL/min    Comment: Calculated using the CKD-EPI Creatinine Equation (2021)   Calcium  9.4 8.4 - 10.5 mg/dL  TSH     Status: None   Collection Time: 08/27/23  8:08 AM  Result Value Ref Range   TSH 3.61 0.35 - 5.50 uIU/mL  Hepatic function panel     Status: None   Collection Time: 08/27/23  8:08 AM  Result Value Ref Range   Total Bilirubin 0.3 0.2 - 1.2 mg/dL   Bilirubin, Direct 0.1 0.0 - 0.3 mg/dL   Alkaline Phosphatase 61 39 - 117 U/L   AST 20 0 - 37 U/L   ALT 28 0 - 53 U/L   Total Protein 6.5 6.0 - 8.3 g/dL   Albumin 4.4 3.5 - 5.2 g/dL  CBC with Differential/Platelet     Status: Abnormal   Collection Time: 08/27/23  8:08 AM  Result Value Ref Range   WBC 5.5 4.0 - 10.5 K/uL   RBC 4.27 4.22 - 5.81 Mil/uL   Hemoglobin 12.9 (L) 13.0 - 17.0 g/dL   HCT 47.8 29.5 - 62.1 %   MCV 91.2 78.0 - 100.0 fl   MCHC 33.0 30.0 - 36.0 g/dL   RDW 30.8 65.7 - 84.6 %   Platelets 219.0 150.0 - 400.0 K/uL   Neutrophils Relative % 56.6 43.0 - 77.0 %   Lymphocytes Relative 31.6 12.0 - 46.0 %   Monocytes Relative 8.9 3.0 - 12.0 %   Eosinophils Relative 2.5 0.0 - 5.0 %   Basophils Relative 0.4 0.0 - 3.0 %   Neutro Abs 3.1 1.4 - 7.7 K/uL   Lymphs Abs 1.7 0.7 - 4.0 K/uL   Monocytes Absolute 0.5 0.1 - 1.0 K/uL   Eosinophils Absolute 0.1 0.0 - 0.7 K/uL   Basophils Absolute 0.0 0.0 - 0.1 K/uL  PSA  Status: None   Collection Time: 08/27/23  8:08 AM  Result Value Ref Range   PSA 0.66 0.10 - 4.00 ng/mL    Comment: Test performed using Access [employer] PSA Assay, a parmagnetic partical, chemiluminecent immunoassay.     Psychiatric Specialty Exam: Physical Exam  Review of Systems  Weight 224 lb (101.6 kg).There is no height or weight on file to calculate BMI.  General Appearance: Casual  Eye Contact:  Good  Speech:  Clear and Coherent and Normal Rate  Volume:  Normal  Mood:  Euthymic  Affect:  Appropriate  Thought Process:  Goal Directed  Orientation:  Full (Time, Place, and  Person)  Thought Content:  WDL  Suicidal Thoughts:  No  Homicidal Thoughts:  No  Memory:  Immediate;   Good Recent;   Good Remote;   Good  Judgement:  Good  Insight:  Present  Psychomotor Activity:  Normal  Concentration:  Concentration: Good and Attention Span: Good  Recall:  Good  Fund of Knowledge:  Good  Language:  Good  Akathisia:  No  Handed:  Right  AIMS (if indicated):     Assets:  Communication Skills Desire for Improvement Housing Leisure Time Physical Health Resilience Social Support Talents/Skills Transportation  ADL's:  Intact  Cognition:  WNL  Sleep:  ok with BiPAP       08/27/2023    7:38 AM 06/01/2023    3:13 PM 12/11/2022    8:44 AM 08/26/2022    8:18 AM 08/21/2021    8:24 AM  Depression screen PHQ 2/9  Decreased Interest 0 0 1 0 1  Down, Depressed, Hopeless 0 0 1 1 0  PHQ - 2 Score 0 0 2 1 1   Altered sleeping 1  3 2 1   Tired, decreased energy 0  3 2 0  Change in appetite 1  0 0 0  Feeling bad or failure about yourself  1  0 1 0  Trouble concentrating 0  2 0 0  Moving slowly or fidgety/restless 0  0 0 0  Suicidal thoughts 0  0 0 0  PHQ-9 Score 3  10 6 2   Difficult doing work/chores Not difficult at all  Not difficult at all Not difficult at all Not difficult at all    Assessment/Plan: Bipolar 1 disorder (HCC) - Plan: lamoTRIgine  (LAMICTAL ) 150 MG tablet, OLANZapine  (ZYPREXA ) 10 MG tablet, sertraline  (ZOLOFT ) 100 MG tablet  Anxiety - Plan: sertraline  (ZOLOFT ) 100 MG tablet  Patient is 58 year old Caucasian married employed man with history of sleep apnea, hyperlipidemia bipolar disorder and anxiety.  We had cut down the olanzapine  dose and he had no significant worsening of symptoms.  I reviewed blood work results.  His LDL is 9.  He really likes the new medicine to help his lowering lipid.  He like to try further cutting down the olanzapine .  Currently is taking 10 mg in recommend to cut down 5 mg.  Continue Lamictal  150 mg twice a day and Zoloft   150 mg daily.  Recommend to call us  back if is any question or any concern.  Follow-up in 3 months.   Follow Up Instructions:     I discussed the assessment and treatment plan with the patient. The patient was provided an opportunity to ask questions and all were answered. The patient agreed with the plan and demonstrated an understanding of the instructions.   The patient was advised to call back or seek an in-person evaluation if the symptoms worsen or if the  condition fails to improve as anticipated.    Collaboration of Care: Other provider involved in patient's care AEB notes are available in epic to review  Patient/Guardian was advised Release of Information must be obtained prior to any record release in order to collaborate their care with an outside provider. Patient/Guardian was advised if they have not already done so to contact the registration department to sign all necessary forms in order for us  to release information regarding their care.   Consent: Patient/Guardian gives verbal consent for treatment and assignment of benefits for services provided during this visit. Patient/Guardian expressed understanding and agreed to proceed.     Total encounter time 15 minutes which includes face-to-face time, chart reviewed, care coordination, order entry and documentation during this encounter.   Note: This document was prepared by [employer] voice dictation technology and any errors that results from this process are unintentional.    Arturo Late, MD 09/03/2023

## 2023-09-22 ENCOUNTER — Other Ambulatory Visit (HOSPITAL_COMMUNITY): Payer: Self-pay

## 2023-09-22 ENCOUNTER — Encounter (HOSPITAL_COMMUNITY): Payer: Self-pay

## 2023-09-22 DIAGNOSIS — F319 Bipolar disorder, unspecified: Secondary | ICD-10-CM

## 2023-09-22 MED ORDER — OLANZAPINE 5 MG PO TABS: 5.0000 mg | ORAL_TABLET | Freq: Every day | ORAL | 2 refills | Status: DC

## 2023-10-05 ENCOUNTER — Other Ambulatory Visit: Payer: Self-pay | Admitting: Pharmacist

## 2023-10-05 MED ORDER — REPATHA SURECLICK 140 MG/ML ~~LOC~~ SOAJ: 1.0000 mL | SUBCUTANEOUS | 3 refills | Status: AC

## 2023-11-17 ENCOUNTER — Encounter: Payer: Self-pay | Admitting: Pulmonary Disease

## 2023-11-17 ENCOUNTER — Ambulatory Visit: Admitting: Pulmonary Disease

## 2023-11-17 VITALS — BP 123/71 | HR 73 | Temp 99.2°F | Ht 68.0 in | Wt 226.4 lb

## 2023-11-17 DIAGNOSIS — Z87891 Personal history of nicotine dependence: Secondary | ICD-10-CM

## 2023-11-17 DIAGNOSIS — G4733 Obstructive sleep apnea (adult) (pediatric): Secondary | ICD-10-CM | POA: Diagnosis not present

## 2023-11-17 NOTE — Patient Instructions (Signed)
 I will see you back in about 6 months  Continue using your BiPAP every night  Call us  with significant concerns  Graded exercise as tolerated  Weight loss efforts as tolerated

## 2023-11-17 NOTE — Progress Notes (Signed)
 Timothy Burnett    [id number]    1965-06-25  Primary Care Physician:Tabori, Comer BRAVO, MD  Referring Physician: Mahlon Comer BRAVO, MD [phone] [address],  [place] [address]  Chief complaint:   Patient with a history of severe obstructive sleep apnea In for follow-up  HPI:  Has been doing relatively well Denies any significant complaints today  Has been using his BiPAP on a regular basis Just received a new mask  He is currently on BiPAP ST Pressures adjustment at last visit  Seems to be tolerating his BiPAP well  Waking up feeling like he is out of good nights rest  History of bipolar disorder, shiftwork disorder  Weight has been stable  Daytime sleepiness has improved from the last visit He has a history of hyperlipidemia, bipolar disorder, chronic allergies  Works in retail, quit smoking in 2023  Outpatient Encounter Medications as of 11/17/2023  Medication Sig   Evolocumab  (REPATHA  SURECLICK) 140 MG/ML SOAJ Inject 140 mg into the skin every 14 (fourteen) days.   ezetimibe  (ZETIA ) 10 MG tablet Take 1 tablet (10 mg total) by mouth daily.   fenofibrate  160 MG tablet TAKE ONE TABLET BY MOUTH ONE TIME DAILY   lamoTRIgine  (LAMICTAL ) 150 MG tablet Take 1 tablet (150 mg total) by mouth 2 (two) times daily.   Multiple Vitamin (MULTIVITAMIN) tablet Take 1 tablet by mouth daily.   OLANZapine  (ZYPREXA ) 5 MG tablet Take 1 tablet (5 mg total) by mouth at bedtime.   Omega-3 Fatty Acids (FISH OIL) 1200 MG CAPS Take 2 capsules by mouth daily.   rosuvastatin  (CRESTOR ) 40 MG tablet TAKE ONE TABLET BY MOUTH ONE TIME DAILY   sertraline  (ZOLOFT ) 100 MG tablet Take 1.5 tablets (150 mg total) by mouth daily.   [DISCONTINUED] naproxen  (NAPROSYN ) 500 MG tablet Take 1 tablet (500 mg total) by mouth 2 (two) times daily with a meal.   No facility-administered encounter medications on file as of 11/17/2023.    Allergies as of 11/17/2023 - Review Complete 11/17/2023   Allergen Reaction Noted   Penicillins Rash 05/09/2011    Past Medical History:  Diagnosis Date   Bipolar disorder (HCC)    Depression    Hyperlipemia    Ulcer    stomach    Past Surgical History:  Procedure Laterality Date   feet surgery     bone spurs   FOOT SURGERY  2012   Bil   TONSILLECTOMY      Family History  Problem Relation Age of Onset   Hyperlipidemia Mother    Heart disease Mother    Hypertension Mother    Hyperlipidemia Father    Colon cancer Neg Hx    Colon polyps Neg Hx    Esophageal cancer Neg Hx    Rectal cancer Neg Hx    Stomach cancer Neg Hx     Social History   Socioeconomic History   Marital status: Married    Spouse name: Not on file   Number of children: Not on file   Years of education: Not on file   Highest education level: Bachelor's degree (e.g., BA, AB, BS)  Occupational History   Not on file  Tobacco Use   Smoking status: Former    Current packs/day: 0.00    Types: Cigarettes    Quit date: 12/29/2021    Years since quitting: 1.8   Smokeless tobacco: Never  Vaping Use   Vaping status: Never Used  Substance and Sexual Activity   Alcohol use: No    Alcohol/week: 0.0 standard drinks of alcohol    Comment: Occasional    Drug use: No   Sexual activity: Not Currently  Other Topics Concern   Not on file  Social History Narrative   Not on file   Social Drivers of Health   Financial Resource Strain: Low Risk  (12/10/2022)   Overall Financial Resource Strain (CARDIA)    Difficulty of Paying Living Expenses: Not very hard  Food Insecurity: No Food Insecurity (12/10/2022)   Hunger Vital Sign    Worried About Running Out of Food in the Last Year: Never true    Ran Out of Food in the Last Year: Never true  Transportation Needs: No Transportation Needs (12/10/2022)   PRAPARE - Administrator, Civil Service (Medical): No    Lack of Transportation (Non-Medical): No  Physical Activity: Insufficiently Active (12/10/2022)    Exercise Vital Sign    Days of Exercise per Week: 3 days    Minutes of Exercise per Session: 30 min  Stress: Stress Concern Present (12/10/2022)   [employer] of Occupational Health - Occupational Stress Questionnaire    Feeling of Stress : To some extent  Social Connections: Moderately Integrated (12/10/2022)   Social Connection and Isolation Panel    Frequency of Communication with Friends and Family: Three times a week    Frequency of Social Gatherings with Friends and Family: Once a week    Attends Religious Services: 1 to 4 times per year    Active Member of [employer] or Organizations: No    Attends Engineer, structural: Not on file    Marital Status: Married  Intimate Partner Violence: Unknown (07/03/2021)   Received from [facility]   HITS    Physically Hurt: Not on file    Insult or Talk Down To: Not on file    Threaten Physical Harm: Not on file    Scream or Curse: Not on file    Review of Systems  Constitutional:  Negative for fatigue.  Respiratory:  Positive for apnea.   Psychiatric/Behavioral:  Positive for sleep disturbance.     Vitals:   11/17/23 0949  BP: 123/71  Pulse: 73  Temp: 99.2 F (37.3 C)  SpO2: 98%     Physical Exam Constitutional:      Appearance: Normal appearance.  HENT:     Head: Normocephalic.     Mouth/Throat:     Mouth: Mucous membranes are moist.  Eyes:     General: No scleral icterus. Cardiovascular:     Rate and Rhythm: Normal rate and regular rhythm.  Pulmonary:     Effort: No respiratory distress.     Breath sounds: No stridor. No wheezing or rhonchi.  Chest:     Chest wall: No tenderness.  Musculoskeletal:     Cervical back: No rigidity or tenderness.  Neurological:     Mental Status: He is alert.  Psychiatric:        Mood and Affect: Mood normal.    Data Reviewed:  BiPAP compliance reviewed showing excellent compliance with BiPAP Average use of 5 hours 9 minutes Residual AHI of 3.1  On BiPAP 24/18 with  a backup rate of 10  Home sleep study July 27, 2022 shows AHI of 49.1 with SpO2 nadir of 86%  Assessment:  Severe obstructive sleep apnea on BiPAP therapy  Appears to be tolerating BiPAP well -Recently had a mask change  Feels  better, less sleepy with new BiPAP pressures  Plan/Recommendations:  Continue BiPAP 24/18 with a backup rate of 10  Graded exercises encouraged  Weight loss efforts encouraged  Follow-up in about 6 months  Encouraged to give us  a call with any significant concerns  Appears to be benefiting from BiPAP use    Jennet Epley MD [facility] 11/17/2023, 9:59 AM  CC: Mahlon Comer BRAVO, MD

## 2023-11-24 ENCOUNTER — Telehealth: Payer: Self-pay | Admitting: Pulmonary Disease

## 2023-11-24 NOTE — Telephone Encounter (Signed)
 Copied from CRM #[id number]. Topic: Medical Record Request - Provider/Facility Request >> Nov 24, 2023 11:28 AM Leila C wrote: Reason for CRM: Odean from [facility] [phone] case #[id number] to verify if the office received psych forms faxed yesterday for Dr. Neda. Please call back as soon as possible.

## 2023-11-24 NOTE — Telephone Encounter (Signed)
 I did not see any mail in Dr Cathye box. I tried calling Odean with [employer] with the number given, I left a message for her to give us  a call back. Routing to [place]ty, [place] as she does have these papers.

## 2023-11-26 ENCOUNTER — Other Ambulatory Visit (HOSPITAL_COMMUNITY): Payer: Self-pay | Admitting: Psychiatry

## 2023-11-26 DIAGNOSIS — F319 Bipolar disorder, unspecified: Secondary | ICD-10-CM

## 2023-11-27 ENCOUNTER — Other Ambulatory Visit (HOSPITAL_COMMUNITY): Payer: Self-pay | Admitting: Psychiatry

## 2023-11-27 DIAGNOSIS — F419 Anxiety disorder, unspecified: Secondary | ICD-10-CM

## 2023-11-27 DIAGNOSIS — F319 Bipolar disorder, unspecified: Secondary | ICD-10-CM

## 2023-11-27 NOTE — Telephone Encounter (Signed)
 Faxed back to [employer] 11/27/23

## 2023-12-04 ENCOUNTER — Telehealth: Payer: Self-pay

## 2023-12-04 ENCOUNTER — Other Ambulatory Visit (HOSPITAL_COMMUNITY): Payer: Self-pay | Admitting: Psychiatry

## 2023-12-04 DIAGNOSIS — F319 Bipolar disorder, unspecified: Secondary | ICD-10-CM

## 2023-12-04 NOTE — Telephone Encounter (Addendum)
 Copied from CRM #[id number]. Topic: Clinical - Medication Question >> Dec 02, 2023  3:16 PM Rozanna G wrote: Reason for CRM: KAT WITH  [employer] CALLING ABOUT FAX, STATED STILL HAS NOT REC'D STATED PLEASE SEND TO THIS NUMBER [phone].   Refaxed paperwork   -NFN

## 2023-12-07 ENCOUNTER — Telehealth (HOSPITAL_COMMUNITY): Admitting: Psychiatry

## 2023-12-07 ENCOUNTER — Encounter (HOSPITAL_COMMUNITY): Payer: Self-pay | Admitting: Psychiatry

## 2023-12-07 VITALS — Wt 226.0 lb

## 2023-12-07 DIAGNOSIS — F319 Bipolar disorder, unspecified: Secondary | ICD-10-CM

## 2023-12-07 DIAGNOSIS — F419 Anxiety disorder, unspecified: Secondary | ICD-10-CM

## 2023-12-07 MED ORDER — OLANZAPINE 2.5 MG PO TABS: 2.5000 mg | ORAL_TABLET | Freq: Every day | ORAL | 0 refills | Status: DC

## 2023-12-07 MED ORDER — SERTRALINE HCL 100 MG PO TABS: 150.0000 mg | ORAL_TABLET | Freq: Every day | ORAL | 0 refills | Status: DC

## 2023-12-07 MED ORDER — LAMOTRIGINE 150 MG PO TABS: 150.0000 mg | ORAL_TABLET | Freq: Two times a day (BID) | ORAL | 0 refills | Status: DC

## 2023-12-07 NOTE — Progress Notes (Signed)
 [facility] MD Virtual Progress Note   Patient Location: Work Provider Location: Home Office  I connect with patient by video and verified that I am speaking with correct person by using two identifiers. I discussed the limitations of evaluation and management by telemedicine and the availability of in person appointments. I also discussed with the patient that there may be a patient responsible charge related to this service. The patient expressed understanding and agreed to proceed.  Timothy Burnett [id number] 58 y.o.  12/07/2023 1:58 PM  History of Present Illness:  Patient is evaluated by video session.  He is at work.  On the last visit we cut down olanzapine  and he is only taking 5 mg.  He had not noticed significant weight loss but actually his sleep is better.  He is using BiPAP.  He denies any irritability, anger, mania, psychosis or any hallucination.  He reported job is going well and he is looking forward to have a cruise trip in January.  He reported summer is quite and not much travel but next year in October he had a plan to take time off to go to [place] with his wife.  He reported mood stable and denies any highs or lows or any anger.  He denies any panic attack.  He is compliant with Zoloft  and Lamictal  along with olanzapine .  He has no rash, itching, tremors or shakes.  Patient used to take olanzapine  20 mg but gradually dose has been cut down after he is feeling good.  He denies drinking or using any illegal substances.  Past Psychiatric History: H/O mania and impulsive behavior.  No h/o psychiatric inpatient treatment or suicidal attempt.  Tried Abilify and Geodon with poor outcome. Vistaril  made groggy. Trazodone  caused dreams, Melatonin did not work.  Gabapentin  caused numbness and BuSpar  did not work.      Past Medical History:  Diagnosis Date   Bipolar disorder (HCC)    Depression    Hyperlipemia    Ulcer    stomach    Outpatient Encounter Medications as  of 12/07/2023  Medication Sig   Evolocumab  (REPATHA  SURECLICK) 140 MG/ML SOAJ Inject 140 mg into the skin every 14 (fourteen) days.   ezetimibe  (ZETIA ) 10 MG tablet Take 1 tablet (10 mg total) by mouth daily.   fenofibrate  160 MG tablet TAKE ONE TABLET BY MOUTH ONE TIME DAILY   lamoTRIgine  (LAMICTAL ) 150 MG tablet Take 1 tablet (150 mg total) by mouth 2 (two) times daily.   Multiple Vitamin (MULTIVITAMIN) tablet Take 1 tablet by mouth daily.   OLANZapine  (ZYPREXA ) 5 MG tablet Take 1 tablet (5 mg total) by mouth at bedtime.   Omega-3 Fatty Acids (FISH OIL) 1200 MG CAPS Take 2 capsules by mouth daily.   rosuvastatin  (CRESTOR ) 40 MG tablet TAKE ONE TABLET BY MOUTH ONE TIME DAILY   sertraline  (ZOLOFT ) 100 MG tablet Take 1.5 tablets (150 mg total) by mouth daily.   No facility-administered encounter medications on file as of 12/07/2023.    No results found for this or any previous visit (from the past 2160 hours).   Psychiatric Specialty Exam: Physical Exam  Review of Systems  Weight 226 lb (102.5 kg).There is no height or weight on file to calculate BMI.  General Appearance: Casual  Eye Contact:  Good  Speech:  Clear and Coherent and Normal Rate  Volume:  Normal  Mood:  Euthymic  Affect:  Appropriate  Thought Process:  Goal Directed  Orientation:  Full (Time, Place,  and Person)  Thought Content:  Logical  Suicidal Thoughts:  No  Homicidal Thoughts:  No  Memory:  Immediate;   Good Recent;   Good Remote;   Good  Judgement:  Good  Insight:  Good  Psychomotor Activity:  Normal  Concentration:  Concentration: Good and Attention Span: Good  Recall:  Good  Fund of Knowledge:  Good  Language:  Good  Akathisia:  No  Handed:  Right  AIMS (if indicated):     Assets:  Communication Skills Desire for Improvement Financial Resources/Insurance Housing Social Support Talents/Skills Transportation  ADL's:  Intact  Cognition:  WNL  Sleep:  ok with BiPap       08/27/2023    7:38 AM  06/01/2023    3:13 PM 12/11/2022    8:44 AM 08/26/2022    8:18 AM 08/21/2021    8:24 AM  Depression screen PHQ 2/9  Decreased Interest 0 0 1 0 1  Down, Depressed, Hopeless 0 0 1 1 0  PHQ - 2 Score 0 0 2 1 1   Altered sleeping 1  3 2 1   Tired, decreased energy 0  3 2 0  Change in appetite 1  0 0 0  Feeling bad or failure about yourself  1  0 1 0  Trouble concentrating 0  2 0 0  Moving slowly or fidgety/restless 0  0 0 0  Suicidal thoughts 0  0 0 0  PHQ-9 Score 3  10 6 2   Difficult doing work/chores Not difficult at all  Not difficult at all Not difficult at all Not difficult at all    Assessment/Plan: Bipolar 1 disorder (HCC) - Plan: OLANZapine  (ZYPREXA ) 2.5 MG tablet, sertraline  (ZOLOFT ) 100 MG tablet, lamoTRIgine  (LAMICTAL ) 150 MG tablet  Anxiety - Plan: sertraline  (ZOLOFT ) 100 MG tablet  Patient is 58 year old Caucasian married employed man with history of bipolar disorder, anxiety, sleep apnea.  Currently doing very well on his medication.  We had cut down olanzapine  to 5 mg and I recommend to try further cut down to 2.5 mg.  We will consider taking him off from olanzapine  on his next appointment if patient remains stable.  Patient agreed with the plan.  Continue Lamictal  150 mg twice a day, Zoloft  150 mg daily and new dose of olanzapine  2.5 mg at bedtime.  Encouraged to call back if he notices worsening of symptoms.  Follow-up in 3 months.   Follow Up Instructions:     I discussed the assessment and treatment plan with the patient. The patient was provided an opportunity to ask questions and all were answered. The patient agreed with the plan and demonstrated an understanding of the instructions.   The patient was advised to call back or seek an in-person evaluation if the symptoms worsen or if the condition fails to improve as anticipated.    Collaboration of Care: Other provider involved in patient's care AEB notes are available in epic to review  Patient/Guardian was advised  Release of Information must be obtained prior to any record release in order to collaborate their care with an outside provider. Patient/Guardian was advised if they have not already done so to contact the registration department to sign all necessary forms in order for us  to release information regarding their care.   Consent: Patient/Guardian gives verbal consent for treatment and assignment of benefits for services provided during this visit. Patient/Guardian expressed understanding and agreed to proceed.     Total encounter time 17 minutes which includes face-to-face time,  chart reviewed, care coordination, order entry and documentation during this encounter.   Note: This document was prepared by [employer] voice dictation technology and any errors that results from this process are unintentional.    Leni ONEIDA Client, MD 12/07/2023

## 2023-12-11 ENCOUNTER — Encounter (HOSPITAL_BASED_OUTPATIENT_CLINIC_OR_DEPARTMENT_OTHER): Payer: Self-pay | Admitting: Internal Medicine

## 2023-12-14 ENCOUNTER — Other Ambulatory Visit (HOSPITAL_COMMUNITY): Payer: Self-pay | Admitting: Psychiatry

## 2023-12-14 DIAGNOSIS — F319 Bipolar disorder, unspecified: Secondary | ICD-10-CM

## 2024-01-05 ENCOUNTER — Other Ambulatory Visit: Payer: Self-pay | Admitting: Family Medicine

## 2024-01-05 ENCOUNTER — Other Ambulatory Visit: Payer: Self-pay | Admitting: Internal Medicine

## 2024-01-05 DIAGNOSIS — E785 Hyperlipidemia, unspecified: Secondary | ICD-10-CM

## 2024-01-25 ENCOUNTER — Encounter: Payer: Self-pay | Admitting: Family Medicine

## 2024-02-28 ENCOUNTER — Encounter: Payer: Self-pay | Admitting: Pulmonary Disease

## 2024-02-29 ENCOUNTER — Encounter: Payer: Self-pay | Admitting: Family Medicine

## 2024-02-29 ENCOUNTER — Ambulatory Visit: Admitting: Family Medicine

## 2024-02-29 ENCOUNTER — Other Ambulatory Visit: Payer: Self-pay | Admitting: *Deleted

## 2024-02-29 VITALS — BP 120/70 | HR 78 | Temp 97.9°F | Ht 68.0 in | Wt 213.4 lb

## 2024-02-29 DIAGNOSIS — E785 Hyperlipidemia, unspecified: Secondary | ICD-10-CM

## 2024-02-29 DIAGNOSIS — Z6832 Body mass index (BMI) 32.0-32.9, adult: Secondary | ICD-10-CM

## 2024-02-29 DIAGNOSIS — E669 Obesity, unspecified: Secondary | ICD-10-CM | POA: Diagnosis not present

## 2024-02-29 DIAGNOSIS — G4733 Obstructive sleep apnea (adult) (pediatric): Secondary | ICD-10-CM

## 2024-02-29 LAB — LIPID PANEL
Cholesterol: 84 mg/dL (ref 0–200)
HDL: 26.6 mg/dL — ABNORMAL LOW (ref >39.00)
LDL Cholesterol: 24 mg/dL (ref 0–99)
NonHDL: 57.83
Total CHOL/HDL Ratio: 3
Triglycerides: 167 mg/dL — ABNORMAL HIGH (ref 0.0–149.0)
VLDL: 33.4 mg/dL (ref 0.0–40.0)

## 2024-02-29 LAB — CBC WITH DIFFERENTIAL/PLATELET
Basophils Absolute: 0 K/uL (ref 0.0–0.1)
Basophils Relative: 0.3 % (ref 0.0–3.0)
Eosinophils Absolute: 0.1 K/uL (ref 0.0–0.7)
Eosinophils Relative: 1.8 % (ref 0.0–5.0)
HCT: 40.9 % (ref 39.0–52.0)
Hemoglobin: 13.4 g/dL (ref 13.0–17.0)
Lymphocytes Relative: 29.6 % (ref 12.0–46.0)
Lymphs Abs: 1.7 K/uL (ref 0.7–4.0)
MCHC: 32.9 g/dL (ref 30.0–36.0)
MCV: 91.7 fl (ref 78.0–100.0)
Monocytes Absolute: 0.4 K/uL (ref 0.1–1.0)
Monocytes Relative: 7.9 % (ref 3.0–12.0)
Neutro Abs: 3.4 K/uL (ref 1.4–7.7)
Neutrophils Relative %: 60.4 % (ref 43.0–77.0)
Platelets: 225 K/uL (ref 150.0–400.0)
RBC: 4.46 Mil/uL (ref 4.22–5.81)
RDW: 14.6 % (ref 11.5–15.5)
WBC: 5.6 K/uL (ref 4.0–10.5)

## 2024-02-29 LAB — HEPATIC FUNCTION PANEL
ALT: 35 U/L (ref 0–53)
AST: 30 U/L (ref 0–37)
Albumin: 4.6 g/dL (ref 3.5–5.2)
Alkaline Phosphatase: 60 U/L (ref 39–117)
Bilirubin, Direct: 0.1 mg/dL (ref 0.0–0.3)
Total Bilirubin: 0.5 mg/dL (ref 0.2–1.2)
Total Protein: 7.2 g/dL (ref 6.0–8.3)

## 2024-02-29 LAB — BASIC METABOLIC PANEL WITH GFR
BUN: 18 mg/dL (ref 6–23)
CO2: 29 meq/L (ref 19–32)
Calcium: 9.8 mg/dL (ref 8.4–10.5)
Chloride: 104 meq/L (ref 96–112)
Creatinine, Ser: 1.04 mg/dL (ref 0.40–1.50)
GFR: 78.77 mL/min (ref >60.00)
Glucose, Bld: 81 mg/dL (ref 70–99)
Potassium: 4.3 meq/L (ref 3.5–5.1)
Sodium: 141 meq/L (ref 135–145)

## 2024-02-29 LAB — TSH: TSH: 3.89 u[IU]/mL (ref 0.35–5.50)

## 2024-02-29 NOTE — Progress Notes (Signed)
   Subjective:    Patient ID: Timothy Burnett, male    DOB: May 16, 1965, 58 y.o.   MRN: [id number]  HPI Hyperlipidemia- chronic problem, on Crestor  40mg  daily, Repatha  140mg  q14 days, Zetia  10mg  daily, Fenofibrate  160mg  daily.  No CP, SOB, abd pain, N/V.  Obesity- down 13 lbs since last visit.  BMI now 32.45.  no regular exercise but very active at work and on a regular basis.   Review of Systems For ROS see HPI     Objective:   Physical Exam Vitals reviewed.  Constitutional:      General: He is not in acute distress.    Appearance: Normal appearance. He is well-developed. He is not ill-appearing.  HENT:     Head: Normocephalic and atraumatic.  Eyes:     Extraocular Movements: Extraocular movements intact.     Conjunctiva/sclera: Conjunctivae normal.     Pupils: Pupils are equal, round, and reactive to light.  Neck:     Thyroid : No thyromegaly.  Cardiovascular:     Rate and Rhythm: Normal rate and regular rhythm.     Pulses: Normal pulses.     Heart sounds: Normal heart sounds. No murmur heard. Pulmonary:     Effort: Pulmonary effort is normal. No respiratory distress.     Breath sounds: Normal breath sounds.  Abdominal:     General: Bowel sounds are normal. There is no distension.     Palpations: Abdomen is soft.  Musculoskeletal:     Cervical back: Normal range of motion and neck supple.     Right lower leg: No edema.     Left lower leg: No edema.  Lymphadenopathy:     Cervical: No cervical adenopathy.  Skin:    General: Skin is warm and dry.  Neurological:     General: No focal deficit present.     Mental Status: He is alert and oriented to person, place, and time.     Cranial Nerves: No cranial nerve deficit.  Psychiatric:        Mood and Affect: Mood normal.        Behavior: Behavior normal.           Assessment & Plan:

## 2024-02-29 NOTE — Patient Instructions (Signed)
Schedule your complete physical in 6 months We'll notify you of your lab results and make any changes if needed Continue to work on healthy diet and regular exercise- you look great! Call with any questions or concerns Stay Safe!  Stay Healthy! Happy Holidays!!! 

## 2024-02-29 NOTE — Assessment & Plan Note (Signed)
 Chronic problem.  Down 13 lbs since last visit.  Applauded his efforts.  Check labs.  Will follow.

## 2024-02-29 NOTE — Assessment & Plan Note (Signed)
 Chronic problem.  On Crestor , Repatha , Zetia , and Fenofibrate  w/o difficulty.  Currently asymptomatic.  Down 13 lbs since last visit.  Check labs.  Adjust meds prn

## 2024-03-01 ENCOUNTER — Ambulatory Visit: Payer: Self-pay | Admitting: Family Medicine

## 2024-03-01 ENCOUNTER — Telehealth: Payer: Self-pay | Admitting: Pulmonary Disease

## 2024-03-01 NOTE — Telephone Encounter (Signed)
 Per Glade at [facility]- pending [employer]. Can that please be signed to send the supplies?

## 2024-03-02 ENCOUNTER — Other Ambulatory Visit (HOSPITAL_COMMUNITY): Payer: Self-pay | Admitting: Psychiatry

## 2024-03-02 DIAGNOSIS — F419 Anxiety disorder, unspecified: Secondary | ICD-10-CM

## 2024-03-02 DIAGNOSIS — F319 Bipolar disorder, unspecified: Secondary | ICD-10-CM

## 2024-03-03 NOTE — Telephone Encounter (Signed)
 Okay to have any of the providers sign.   I am not in the office till next week  I am okay with him having his supplies sent

## 2024-03-03 NOTE — Telephone Encounter (Signed)
 Dr. Neda, This is just a DME order, it can be signed electronically.

## 2024-03-07 ENCOUNTER — Telehealth (HOSPITAL_COMMUNITY): Admitting: Psychiatry

## 2024-03-07 ENCOUNTER — Encounter (HOSPITAL_COMMUNITY): Payer: Self-pay | Admitting: Psychiatry

## 2024-03-07 VITALS — Wt 213.0 lb

## 2024-03-07 DIAGNOSIS — F419 Anxiety disorder, unspecified: Secondary | ICD-10-CM

## 2024-03-07 DIAGNOSIS — F319 Bipolar disorder, unspecified: Secondary | ICD-10-CM

## 2024-03-07 MED ORDER — OLANZAPINE 2.5 MG PO TABS: 2.5000 mg | ORAL_TABLET | Freq: Every day | ORAL | 0 refills | Status: AC

## 2024-03-07 MED ORDER — SERTRALINE HCL 100 MG PO TABS: 150.0000 mg | ORAL_TABLET | Freq: Every day | ORAL | 0 refills | Status: AC

## 2024-03-07 MED ORDER — LAMOTRIGINE 150 MG PO TABS: 150.0000 mg | ORAL_TABLET | Freq: Two times a day (BID) | ORAL | 0 refills | Status: AC

## 2024-03-07 NOTE — Progress Notes (Signed)
 [facility] MD Virtual Progress Note   Patient Location: Work Provider Location: Home Office  I connect with patient by video and verified that I am speaking with correct person by using two identifiers. I discussed the limitations of evaluation and management by telemedicine and the availability of in person appointments. I also discussed with the patient that there may be a patient responsible charge related to this service. The patient expressed understanding and agreed to proceed.  Timothy Burnett [id number] 58 y.o.  03/07/2024 2:21 PM  History of Present Illness:  Patient is evaluated by video session.  He reported things are going well.  We reduced olanzapine  from 5 mg to 2.5 and so far he is tolerating lower dose.  He denies any irritability, anger, mania, agitation.  He sleeps good with the help of BiPAP.  Recently had a blood work and labs are stable.  He lost another 10 pounds since the last visit.  He is not actively trying to lose weight but started watching his calorie intake.  He is compliant with Zoloft  and Lamictal .  He has no rash, itching tremors or shakes.  His job is very busy but he is excited about upcoming cruise trip for 10 days in January with his wife.  He denies drinking or using any illegal substances.  He is working at [employer].  He denies any panic attack or any feeling of hopelessness or worthlessness.  He like to keep his current medication.  Recently he had a blood work and labs are stable.  Past Psychiatric History: H/O mania and impulsive behavior.  No h/o psychiatric inpatient treatment or suicidal attempt.  Tried Abilify and Geodon with poor outcome. Vistaril  made groggy. Trazodone  caused dreams, Melatonin did not work.  Gabapentin  caused numbness and BuSpar  did not work.      Past Medical History:  Diagnosis Date   Bipolar disorder (HCC)    Depression    Hyperlipemia    Ulcer    stomach    Outpatient Encounter Medications as of 03/07/2024   Medication Sig   Evolocumab  (REPATHA  SURECLICK) 140 MG/ML SOAJ Inject 140 mg into the skin every 14 (fourteen) days.   ezetimibe  (ZETIA ) 10 MG tablet TAKE ONE TABLET BY MOUTH ONE TIME DAILY   fenofibrate  160 MG tablet TAKE ONE TABLET BY MOUTH ONE TIME DAILY   lamoTRIgine  (LAMICTAL ) 150 MG tablet Take 1 tablet (150 mg total) by mouth 2 (two) times daily.   Multiple Vitamin (MULTIVITAMIN) tablet Take 1 tablet by mouth daily.   OLANZapine  (ZYPREXA ) 2.5 MG tablet Take 1 tablet (2.5 mg total) by mouth at bedtime.   Omega-3 Fatty Acids (FISH OIL) 1200 MG CAPS Take 2 capsules by mouth daily.   rosuvastatin  (CRESTOR ) 40 MG tablet TAKE ONE TABLET BY MOUTH ONE TIME DAILY   sertraline  (ZOLOFT ) 100 MG tablet Take 1.5 tablets (150 mg total) by mouth daily.   No facility-administered encounter medications on file as of 03/07/2024.    Recent Results (from the past 2160 hours)  Lipid panel     Status: Abnormal   Collection Time: 02/29/24  9:50 AM  Result Value Ref Range   Cholesterol 84 0 - 200 mg/dL    Comment: ATP III Classification       Desirable:  < 200 mg/dL               Borderline High:  200 - 239 mg/dL          High:  > = 759 mg/dL  Triglycerides 167.0 (H) 0.0 - 149.0 mg/dL    Comment: Normal:  <849 mg/dLBorderline High:  150 - 199 mg/dL   HDL 73.39 (L) >60.99 mg/dL   VLDL 66.5 0.0 - 59.9 mg/dL   LDL Cholesterol 24 0 - 99 mg/dL   Total CHOL/HDL Ratio 3     Comment:                Men          Women1/2 Average Risk     3.4          3.3Average Risk          5.0          4.42X Average Risk          9.6          7.13X Average Risk          15.0          11.0                       NonHDL 57.83     Comment: NOTE:  Non-HDL goal should be 30 mg/dL higher than patient's LDL goal (i.e. LDL goal of < 70 mg/dL, would have non-HDL goal of < 100 mg/dL)  Basic metabolic panel with GFR     Status: None   Collection Time: 02/29/24  9:50 AM  Result Value Ref Range   Sodium 141 135 - 145 mEq/L   Potassium  4.3 3.5 - 5.1 mEq/L   Chloride 104 96 - 112 mEq/L   CO2 29 19 - 32 mEq/L    Comment: Elevated LDH levels may cause falsely increased CO2 results. If LDH is >2000 U/L, a positive bias of 12% is possible.   Glucose, Bld 81 70 - 99 mg/dL   BUN 18 6 - 23 mg/dL   Creatinine, Ser 8.95 0.40 - 1.50 mg/dL   GFR 21.22 >39.99 mL/min    Comment: Calculated using the CKD-EPI Creatinine Equation (2021)   Calcium  9.8 8.4 - 10.5 mg/dL  TSH     Status: None   Collection Time: 02/29/24  9:50 AM  Result Value Ref Range   TSH 3.89 0.35 - 5.50 uIU/mL  Hepatic function panel     Status: None   Collection Time: 02/29/24  9:50 AM  Result Value Ref Range   Total Bilirubin 0.5 0.2 - 1.2 mg/dL   Bilirubin, Direct 0.1 0.0 - 0.3 mg/dL   Alkaline Phosphatase 60 39 - 117 U/L   AST 30 0 - 37 U/L   ALT 35 0 - 53 U/L   Total Protein 7.2 6.0 - 8.3 g/dL   Albumin 4.6 3.5 - 5.2 g/dL  CBC with Differential/Platelet     Status: None   Collection Time: 02/29/24  9:50 AM  Result Value Ref Range   WBC 5.6 4.0 - 10.5 K/uL   RBC 4.46 4.22 - 5.81 Mil/uL   Hemoglobin 13.4 13.0 - 17.0 g/dL   HCT 59.0 60.9 - 47.9 %   MCV 91.7 78.0 - 100.0 fl   MCHC 32.9 30.0 - 36.0 g/dL   RDW 85.3 88.4 - 84.4 %   Platelets 225.0 150.0 - 400.0 K/uL   Neutrophils Relative % 60.4 43.0 - 77.0 %   Lymphocytes Relative 29.6 12.0 - 46.0 %   Monocytes Relative 7.9 3.0 - 12.0 %   Eosinophils Relative 1.8 0.0 - 5.0 %   Basophils Relative 0.3 0.0 - 3.0 %  Neutro Abs 3.4 1.4 - 7.7 K/uL   Lymphs Abs 1.7 0.7 - 4.0 K/uL   Monocytes Absolute 0.4 0.1 - 1.0 K/uL   Eosinophils Absolute 0.1 0.0 - 0.7 K/uL   Basophils Absolute 0.0 0.0 - 0.1 K/uL     Psychiatric Specialty Exam: Physical Exam  Review of Systems  Weight 213 lb (96.6 kg).There is no height or weight on file to calculate BMI.  General Appearance: Casual  Eye Contact:  Good  Speech:  Normal Rate  Volume:  Normal  Mood:  Euthymic  Affect:  Appropriate  Thought Process:  Goal  Directed  Orientation:  Full (Time, Place, and Person)  Thought Content:  Logical  Suicidal Thoughts:  No  Homicidal Thoughts:  No  Memory:  Immediate;   Good Recent;   Good Remote;   Good  Judgement:  Good  Insight:  Good  Psychomotor Activity:  Normal  Concentration:  Concentration: Good and Attention Span: Good  Recall:  Good  Fund of Knowledge:  Good  Language:  Good  Akathisia:  No  Handed:  Right  AIMS (if indicated):     Assets:  Communication Skills Desire for Improvement Financial Resources/Insurance Housing Social Support Talents/Skills Transportation  ADL's:  Intact  Cognition:  WNL  Sleep:  good with BiPap       02/29/2024    8:59 AM 08/27/2023    7:38 AM 06/01/2023    3:13 PM 12/11/2022    8:44 AM 08/26/2022    8:18 AM  Depression screen PHQ 2/9  Decreased Interest 1 0 0 1 0  Down, Depressed, Hopeless 0 0 0 1 1  PHQ - 2 Score 1 0 0 2 1  Altered sleeping 0 1  3 2   Tired, decreased energy 0 0  3 2  Change in appetite 0 1  0 0  Feeling bad or failure about yourself  0 1  0 1  Trouble concentrating 0 0  2 0  Moving slowly or fidgety/restless 0 0  0 0  Suicidal thoughts 0 0  0 0  PHQ-9 Score 1 3   10  6    Difficult doing work/chores Not difficult at all Not difficult at all  Not difficult at all Not difficult at all     Data saved with a previous flowsheet row definition    Assessment/Plan: Bipolar 1 disorder (HCC) - Plan: OLANZapine  (ZYPREXA ) 2.5 MG tablet, sertraline  (ZOLOFT ) 100 MG tablet, lamoTRIgine  (LAMICTAL ) 150 MG tablet  Anxiety - Plan: sertraline  (ZOLOFT ) 100 MG tablet  Patient is 58 year old Caucasian married employed man with history of bipolar disorder, anxiety, sleep apnea.  He is doing better with low-dose olanzapine  which was reduced from 5 mg and not taking only 2.5 mg.  He lost weight and feel medicine working.  He has no tremor or shakes or any EPS.  Continue Zoloft  150 mg daily, Lamictal  150 mg twice a day and olanzapine  2.5 mg at  bedtime.  Will consider taking off from olanzapine  in the future.  Recommend to call back with any question or any concern.  Follow-up in 3 months.  Follow Up Instructions:     I discussed the assessment and treatment plan with the patient. The patient was provided an opportunity to ask questions and all were answered. The patient agreed with the plan and demonstrated an understanding of the instructions.   The patient was advised to call back or seek an in-person evaluation if the symptoms worsen or if the condition fails  to improve as anticipated.    Collaboration of Care: Other provider involved in patient's care AEB notes are available in epic to review  Patient/Guardian was advised Release of Information must be obtained prior to any record release in order to collaborate their care with an outside provider. Patient/Guardian was advised if they have not already done so to contact the registration department to sign all necessary forms in order for us  to release information regarding their care.   Consent: Patient/Guardian gives verbal consent for treatment and assignment of benefits for services provided during this visit. Patient/Guardian expressed understanding and agreed to proceed.     Total encounter time 12 minutes which includes face-to-face time, chart reviewed, care coordination, order entry and documentation during this encounter.   Note: This document was prepared by [employer] voice dictation technology and any errors that results from this process are unintentional.    Leni ONEIDA Client, MD 03/07/2024

## 2024-03-11 ENCOUNTER — Telehealth: Payer: Self-pay

## 2024-03-11 NOTE — Telephone Encounter (Signed)
 Copied from CRM #[id number]. Topic: Clinical - Order For Equipment >> Mar 11, 2024  8:25 AM Corean SAUNDERS wrote: Reason for CRM: Patient states he has been waiting since 11/15 for a new CPAP supplies prescription as [facility] states they have faxed the request 3 times.  Patient has been without his CPAP supplies for 1 month is kindly requesting Dr. Neda to please order a new CPAP supplies prescription to [facility].  [facility], an order was placed on 02-29-24. Can you please look into this?

## 2024-03-15 ENCOUNTER — Telehealth: Payer: Self-pay

## 2024-03-15 NOTE — Telephone Encounter (Signed)
 Cpap - paperwork faxed [facility]

## 2024-03-16 NOTE — Telephone Encounter (Signed)
 Spoke with [facility]---[employer] (tracking number [phone]) delivered supplies ( chamber, tubing, mask, cushions and head gear) yesterday to [address] [place], [place] [address] .  LVM for patient to call and confirm delivery

## 2024-03-16 NOTE — Telephone Encounter (Signed)
 Order was written on 02/29/24.  [facility] acknowledged they received it.  CMN was received and signed by Dr. Neda and faxed to them on 12/16.  Message sent to [facility] to follow up.  Routing to [facility] to follow up.

## 2024-03-16 NOTE — Telephone Encounter (Signed)
 Kindly ensure that order is in place for supplies

## 2024-03-17 ENCOUNTER — Telehealth: Payer: Self-pay

## 2024-03-17 NOTE — Telephone Encounter (Signed)
 Tried tyo reach out to [facility]iet [facility]  - we only place order we are not the DME, he needs to contact [facility]  he can also use the tracking number he has to see where his supplies are. It should give him a date they will be delivered. With it being so close to the holidays it may take a bit longer       Copied from CRM #[id number]. Topic: General - Other >> Mar 16, 2024  2:46 PM Timothy Burnett wrote: Reason for CRM: Pt returning [facility] phone call, pt stated he did not get CPAP supplies delivered. Provided pt with [employer] tracking number of [employer] (tracking number [phone]). Pt would like a f/u call back regarding this. Pt stated he will f/u with [employer] via tracking.

## 2024-03-21 NOTE — Telephone Encounter (Signed)
 Spoke with patient and he states he did receive his CPAP supplies and everything is working fine.

## 2024-05-02 ENCOUNTER — Ambulatory Visit: Payer: Self-pay | Admitting: Podiatry

## 2024-05-10 ENCOUNTER — Ambulatory Visit: Payer: Self-pay | Admitting: Podiatry

## 2024-06-06 ENCOUNTER — Telehealth (HOSPITAL_COMMUNITY): Admitting: Psychiatry

## 2024-06-14 ENCOUNTER — Ambulatory Visit: Admitting: Pulmonary Disease

## 2024-09-05 ENCOUNTER — Encounter: Admitting: Family Medicine
